# Patient Record
Sex: Female | Born: 1940 | Race: White | Hispanic: No | Smoking: Former smoker
Health system: Southern US, Community
[De-identification: ages and names within clinical notes are randomized; demographics above are authoritative.]

## PROBLEM LIST (undated history)

## (undated) DIAGNOSIS — E669 Obesity, unspecified: Secondary | ICD-10-CM

## (undated) DIAGNOSIS — I452 Bifascicular block: Secondary | ICD-10-CM

## (undated) DIAGNOSIS — R112 Nausea with vomiting, unspecified: Secondary | ICD-10-CM

## (undated) DIAGNOSIS — E039 Hypothyroidism, unspecified: Secondary | ICD-10-CM

## (undated) DIAGNOSIS — F0390 Unspecified dementia without behavioral disturbance: Secondary | ICD-10-CM

## (undated) DIAGNOSIS — Z87440 Personal history of urinary (tract) infections: Secondary | ICD-10-CM

## (undated) DIAGNOSIS — M5137 Other intervertebral disc degeneration, lumbosacral region: Secondary | ICD-10-CM

## (undated) DIAGNOSIS — F419 Anxiety disorder, unspecified: Secondary | ICD-10-CM

## (undated) DIAGNOSIS — Z812 Family history of tobacco abuse and dependence: Secondary | ICD-10-CM

## (undated) DIAGNOSIS — R002 Palpitations: Secondary | ICD-10-CM

## (undated) DIAGNOSIS — M5430 Sciatica, unspecified side: Secondary | ICD-10-CM

## (undated) DIAGNOSIS — M51379 Other intervertebral disc degeneration, lumbosacral region without mention of lumbar back pain or lower extremity pain: Secondary | ICD-10-CM

## (undated) DIAGNOSIS — I10 Essential (primary) hypertension: Secondary | ICD-10-CM

## (undated) DIAGNOSIS — E785 Hyperlipidemia, unspecified: Secondary | ICD-10-CM

## (undated) DIAGNOSIS — Z9889 Other specified postprocedural states: Secondary | ICD-10-CM

## (undated) DIAGNOSIS — M1990 Unspecified osteoarthritis, unspecified site: Secondary | ICD-10-CM

## (undated) DIAGNOSIS — R55 Syncope and collapse: Secondary | ICD-10-CM

## (undated) DIAGNOSIS — C801 Malignant (primary) neoplasm, unspecified: Secondary | ICD-10-CM

## (undated) HISTORY — DX: Unspecified osteoarthritis, unspecified site: M19.90

## (undated) HISTORY — PX: VESICOVAGINAL FISTULA CLOSURE W/ TAH: SUR271

## (undated) HISTORY — DX: Other intervertebral disc degeneration, lumbosacral region: M51.37

## (undated) HISTORY — DX: Other intervertebral disc degeneration, lumbosacral region without mention of lumbar back pain or lower extremity pain: M51.379

## (undated) HISTORY — DX: Palpitations: R00.2

## (undated) HISTORY — PX: OTHER SURGICAL HISTORY: SHX169

## (undated) HISTORY — DX: Hyperlipidemia, unspecified: E78.5

## (undated) HISTORY — DX: Essential (primary) hypertension: I10

## (undated) HISTORY — PX: KNEE SURGERY: SHX244

## (undated) HISTORY — DX: Personal history of urinary (tract) infections: Z87.440

## (undated) HISTORY — PX: CHOLECYSTECTOMY: SHX55

## (undated) HISTORY — DX: Obesity, unspecified: E66.9

## (undated) HISTORY — PX: CATARACT EXTRACTION, BILATERAL: SHX1313

## (undated) HISTORY — DX: Family history of tobacco abuse and dependence: Z81.2

## (undated) HISTORY — PX: TUBAL LIGATION: SHX77

## (undated) HISTORY — DX: Sciatica, unspecified side: M54.30

---

## 2001-09-13 ENCOUNTER — Encounter: Payer: Self-pay | Admitting: Internal Medicine

## 2001-09-13 ENCOUNTER — Ambulatory Visit (HOSPITAL_COMMUNITY): Admission: RE | Admit: 2001-09-13 | Discharge: 2001-09-13 | Payer: Self-pay | Admitting: Internal Medicine

## 2001-10-08 ENCOUNTER — Ambulatory Visit (HOSPITAL_COMMUNITY): Admission: RE | Admit: 2001-10-08 | Discharge: 2001-10-08 | Payer: Self-pay | Admitting: Family Medicine

## 2001-10-08 ENCOUNTER — Encounter: Payer: Self-pay | Admitting: Family Medicine

## 2002-10-14 ENCOUNTER — Ambulatory Visit (HOSPITAL_COMMUNITY): Admission: RE | Admit: 2002-10-14 | Discharge: 2002-10-14 | Payer: Self-pay | Admitting: Family Medicine

## 2002-10-14 ENCOUNTER — Encounter: Payer: Self-pay | Admitting: Family Medicine

## 2003-05-01 ENCOUNTER — Ambulatory Visit (HOSPITAL_COMMUNITY): Admission: RE | Admit: 2003-05-01 | Discharge: 2003-05-01 | Payer: Self-pay | Admitting: Internal Medicine

## 2003-05-01 ENCOUNTER — Encounter: Payer: Self-pay | Admitting: Internal Medicine

## 2003-06-08 ENCOUNTER — Encounter (HOSPITAL_COMMUNITY): Admission: RE | Admit: 2003-06-08 | Discharge: 2003-07-08 | Payer: Self-pay | Admitting: Neurosurgery

## 2003-06-16 ENCOUNTER — Ambulatory Visit (HOSPITAL_BASED_OUTPATIENT_CLINIC_OR_DEPARTMENT_OTHER): Admission: RE | Admit: 2003-06-16 | Discharge: 2003-06-16 | Payer: Self-pay | Admitting: Orthopedic Surgery

## 2006-12-15 DIAGNOSIS — R55 Syncope and collapse: Secondary | ICD-10-CM

## 2006-12-15 HISTORY — DX: Syncope and collapse: R55

## 2007-01-21 ENCOUNTER — Encounter: Payer: Self-pay | Admitting: Cardiology

## 2007-02-04 ENCOUNTER — Ambulatory Visit (HOSPITAL_COMMUNITY): Admission: RE | Admit: 2007-02-04 | Discharge: 2007-02-04 | Payer: Self-pay | Admitting: Family Medicine

## 2007-02-05 ENCOUNTER — Ambulatory Visit: Payer: Self-pay | Admitting: Cardiology

## 2007-02-11 ENCOUNTER — Ambulatory Visit: Payer: Self-pay | Admitting: Cardiology

## 2007-02-11 ENCOUNTER — Ambulatory Visit (HOSPITAL_COMMUNITY): Admission: RE | Admit: 2007-02-11 | Discharge: 2007-02-11 | Payer: Self-pay | Admitting: Cardiology

## 2007-03-25 ENCOUNTER — Emergency Department (HOSPITAL_COMMUNITY): Admission: EM | Admit: 2007-03-25 | Discharge: 2007-03-26 | Payer: Self-pay | Admitting: Emergency Medicine

## 2007-04-08 ENCOUNTER — Ambulatory Visit: Payer: Self-pay | Admitting: Cardiology

## 2007-04-14 ENCOUNTER — Ambulatory Visit (HOSPITAL_BASED_OUTPATIENT_CLINIC_OR_DEPARTMENT_OTHER): Admission: RE | Admit: 2007-04-14 | Discharge: 2007-04-15 | Payer: Self-pay | Admitting: *Deleted

## 2008-03-21 ENCOUNTER — Encounter: Payer: Self-pay | Admitting: Orthopedic Surgery

## 2008-03-21 ENCOUNTER — Ambulatory Visit (HOSPITAL_COMMUNITY): Admission: RE | Admit: 2008-03-21 | Discharge: 2008-03-21 | Payer: Self-pay | Admitting: Family Medicine

## 2008-04-17 ENCOUNTER — Ambulatory Visit: Payer: Self-pay | Admitting: Orthopedic Surgery

## 2008-04-17 DIAGNOSIS — M5430 Sciatica, unspecified side: Secondary | ICD-10-CM | POA: Insufficient documentation

## 2008-04-17 DIAGNOSIS — M5137 Other intervertebral disc degeneration, lumbosacral region: Secondary | ICD-10-CM | POA: Insufficient documentation

## 2008-04-18 ENCOUNTER — Ambulatory Visit: Payer: Self-pay | Admitting: Cardiology

## 2008-04-21 ENCOUNTER — Encounter (INDEPENDENT_AMBULATORY_CARE_PROVIDER_SITE_OTHER): Payer: Self-pay | Admitting: *Deleted

## 2008-04-21 LAB — CONVERTED CEMR LAB
ALT: 12 units/L
AST: 16 units/L
Albumin: 4.3 g/dL
Alkaline Phosphatase: 55 units/L
BUN: 33 mg/dL
CO2: 27 meq/L
Calcium: 9.8 mg/dL
Chloride: 106 meq/L
Cholesterol: 194 mg/dL
Creatinine, Ser: 1.28 mg/dL
Glucose, Bld: 97 mg/dL
HDL: 45 mg/dL
LDL Cholesterol: 116 mg/dL
Potassium: 4.5 meq/L
Sodium: 143 meq/L
TSH: 7.22 microintl units/mL
Total Protein: 7.1 g/dL
Triglycerides: 165 mg/dL

## 2008-07-26 ENCOUNTER — Ambulatory Visit: Payer: Self-pay | Admitting: Orthopedic Surgery

## 2008-10-30 ENCOUNTER — Encounter: Payer: Self-pay | Admitting: Orthopedic Surgery

## 2008-11-13 ENCOUNTER — Telehealth: Payer: Self-pay | Admitting: Orthopedic Surgery

## 2009-12-15 HISTORY — PX: HIP SURGERY: SHX245

## 2009-12-17 ENCOUNTER — Ambulatory Visit: Payer: Self-pay | Admitting: Orthopedic Surgery

## 2009-12-18 ENCOUNTER — Telehealth: Payer: Self-pay | Admitting: Orthopedic Surgery

## 2010-01-09 ENCOUNTER — Encounter (INDEPENDENT_AMBULATORY_CARE_PROVIDER_SITE_OTHER): Payer: Self-pay | Admitting: *Deleted

## 2010-01-16 ENCOUNTER — Encounter (INDEPENDENT_AMBULATORY_CARE_PROVIDER_SITE_OTHER): Payer: Self-pay | Admitting: *Deleted

## 2010-01-16 ENCOUNTER — Ambulatory Visit: Payer: Self-pay | Admitting: Cardiology

## 2010-01-16 DIAGNOSIS — Z8669 Personal history of other diseases of the nervous system and sense organs: Secondary | ICD-10-CM | POA: Insufficient documentation

## 2010-01-16 DIAGNOSIS — K029 Dental caries, unspecified: Secondary | ICD-10-CM | POA: Insufficient documentation

## 2010-01-16 DIAGNOSIS — E669 Obesity, unspecified: Secondary | ICD-10-CM | POA: Insufficient documentation

## 2010-01-21 ENCOUNTER — Encounter: Payer: Self-pay | Admitting: Cardiology

## 2010-01-22 ENCOUNTER — Encounter: Payer: Self-pay | Admitting: Cardiology

## 2010-01-22 ENCOUNTER — Encounter (INDEPENDENT_AMBULATORY_CARE_PROVIDER_SITE_OTHER): Payer: Self-pay | Admitting: *Deleted

## 2010-01-22 LAB — CONVERTED CEMR LAB
BUN: 27 mg/dL — ABNORMAL HIGH (ref 6–23)
Basophils Absolute: 0 10*3/uL (ref 0.0–0.1)
Basophils Relative: 1 % (ref 0–1)
CO2: 30 meq/L (ref 19–32)
Calcium: 10.4 mg/dL (ref 8.4–10.5)
Chloride: 104 meq/L (ref 96–112)
Cholesterol: 241 mg/dL — ABNORMAL HIGH (ref 0–200)
Creatinine, Ser: 1.2 mg/dL (ref 0.40–1.20)
Eosinophils Absolute: 0.1 10*3/uL (ref 0.0–0.7)
Eosinophils Relative: 2 % (ref 0–5)
Glucose, Bld: 95 mg/dL (ref 70–99)
HCT: 38 % (ref 36.0–46.0)
HDL: 38 mg/dL — ABNORMAL LOW (ref >39)
Hemoglobin: 11.9 g/dL — ABNORMAL LOW (ref 12.0–15.0)
LDL Cholesterol: 166 mg/dL — ABNORMAL HIGH (ref 0–99)
Lymphocytes Relative: 22 % (ref 12–46)
Lymphs Abs: 1.3 10*3/uL (ref 0.7–4.0)
MCHC: 31.3 g/dL (ref 30.0–36.0)
MCV: 90.9 fL (ref 78.0–100.0)
Monocytes Absolute: 0.4 10*3/uL (ref 0.1–1.0)
Monocytes Relative: 6 % (ref 3–12)
Neutro Abs: 4.3 10*3/uL (ref 1.7–7.7)
Neutrophils Relative %: 69 % (ref 43–77)
Platelets: 235 10*3/uL (ref 150–400)
Potassium: 5.5 meq/L — ABNORMAL HIGH (ref 3.5–5.3)
RBC: 4.18 M/uL (ref 3.87–5.11)
RDW: 13.5 % (ref 11.5–15.5)
Sodium: 142 meq/L (ref 135–145)
TSH: 7.606 microintl units/mL — ABNORMAL HIGH (ref 0.350–4.500)
Total CHOL/HDL Ratio: 6.3
Triglycerides: 183 mg/dL — ABNORMAL HIGH (ref <150)
VLDL: 37 mg/dL (ref 0–40)
WBC: 6.2 10*3/uL (ref 4.0–10.5)

## 2010-07-29 ENCOUNTER — Ambulatory Visit: Payer: Self-pay | Admitting: Orthopedic Surgery

## 2010-07-29 DIAGNOSIS — M169 Osteoarthritis of hip, unspecified: Secondary | ICD-10-CM | POA: Insufficient documentation

## 2010-08-01 ENCOUNTER — Encounter (INDEPENDENT_AMBULATORY_CARE_PROVIDER_SITE_OTHER): Payer: Self-pay | Admitting: *Deleted

## 2010-08-02 ENCOUNTER — Encounter (INDEPENDENT_AMBULATORY_CARE_PROVIDER_SITE_OTHER): Payer: Self-pay | Admitting: *Deleted

## 2010-08-13 ENCOUNTER — Ambulatory Visit: Payer: Self-pay | Admitting: Orthopedic Surgery

## 2010-08-13 ENCOUNTER — Inpatient Hospital Stay (HOSPITAL_COMMUNITY): Admission: RE | Admit: 2010-08-13 | Discharge: 2010-08-17 | Payer: Self-pay | Admitting: Orthopedic Surgery

## 2010-08-27 ENCOUNTER — Ambulatory Visit: Payer: Self-pay | Admitting: Orthopedic Surgery

## 2010-08-27 DIAGNOSIS — Z96649 Presence of unspecified artificial hip joint: Secondary | ICD-10-CM | POA: Insufficient documentation

## 2010-08-28 ENCOUNTER — Telehealth (INDEPENDENT_AMBULATORY_CARE_PROVIDER_SITE_OTHER): Payer: Self-pay | Admitting: *Deleted

## 2010-08-30 ENCOUNTER — Telehealth: Payer: Self-pay | Admitting: Orthopedic Surgery

## 2010-09-03 ENCOUNTER — Encounter: Payer: Self-pay | Admitting: Orthopedic Surgery

## 2010-09-04 ENCOUNTER — Encounter (HOSPITAL_COMMUNITY): Admission: RE | Admit: 2010-09-04 | Discharge: 2010-09-13 | Payer: Self-pay | Admitting: Orthopedic Surgery

## 2010-09-17 ENCOUNTER — Encounter (HOSPITAL_COMMUNITY): Admission: RE | Admit: 2010-09-17 | Discharge: 2010-10-17 | Payer: Self-pay | Admitting: Orthopedic Surgery

## 2010-09-18 ENCOUNTER — Encounter: Payer: Self-pay | Admitting: Orthopedic Surgery

## 2010-09-25 ENCOUNTER — Ambulatory Visit: Payer: Self-pay | Admitting: Orthopedic Surgery

## 2010-09-26 ENCOUNTER — Encounter: Payer: Self-pay | Admitting: Orthopedic Surgery

## 2010-10-16 ENCOUNTER — Telehealth: Payer: Self-pay | Admitting: Orthopedic Surgery

## 2010-10-18 ENCOUNTER — Encounter (HOSPITAL_COMMUNITY)
Admission: RE | Admit: 2010-10-18 | Discharge: 2010-11-17 | Payer: Self-pay | Source: Home / Self Care | Admitting: Orthopedic Surgery

## 2010-10-30 ENCOUNTER — Encounter: Payer: Self-pay | Admitting: Orthopedic Surgery

## 2010-11-27 ENCOUNTER — Ambulatory Visit: Payer: Self-pay | Admitting: Orthopedic Surgery

## 2011-01-14 NOTE — Progress Notes (Signed)
Summary: req for order out-patient physical therapy  Phone Note Other Incoming   Caller: physical therapist Summary of Call: [facility] Misty Stanley called to request order for out-patient therapy. States working w/patient through this week, then to be discharged to out-patient PT, to [facility].  Her ph# is [phone] if questions.  Initial call taken by: Cammie Sickle,  August 28, 2010 9:19 AM  Follow-up for Phone Call        ok faxed order to [facility] Follow-up by: Ether Griffins,  August 28, 2010 9:44 AM

## 2011-01-14 NOTE — Letter (Signed)
Summary: [place] [facility] Work Engineer, agricultural at [employer]  [address], [place] [address]   Phone: [phone]  Fax: [phone]     January 16, 2010 MRN: [id number]   Carol Rosario [address] [place], [place]  [address]      YOUR LAB WORK IS DUE   ___________FEBRUARY 7, 2011______________________________  Please go to [facility], located across the street from [facility] on the second floor.  Hours are Monday - Friday 7am until 7:30pm         Saturday 8am until 12noon    _X_  DO NOT EAT OR DRINK AFTER MIDNIGHT EVENING PRIOR TO LABWORK  __ YOUR LABWORK IS NOT FASTING --YOU MAY EAT PRIOR TO LABWORK

## 2011-01-14 NOTE — Miscellaneous (Signed)
Summary: PT progress note  PT progress note   Imported By: Jacklynn Ganong 09/26/2010 09:29:06  _____________________________________________________________________  External Attachment:    Type:   Image     Comment:   External Document

## 2011-01-14 NOTE — Progress Notes (Signed)
Summary: wants Oxycodone/apap prescription  Phone Note Call from Patient   Summary of Call: Brodi Nery (2041/[birth year]/06) asked if you will write new prescription for Oxycodone/APAP 5/325. Initial call taken by: Jacklynn Ganong,  October 16, 2010 1:27 PM  Follow-up for Phone Call        The percocet was decline [onlyused in the initial post op period its not a long term drug. she can have hydrocodone]  Chasity will call in  5mg  1 q 4 as needed pain # 84/ 2 refills Follow-up by: Fuller Canada MD,  October 16, 2010 2:47 PM    New/Updated Medications: NORCO 5-325 MG TABS (HYDROCODONE-ACETAMINOPHEN) 1 by mouth q 4 hrs as needed pain Prescriptions: NORCO 5-325 MG TABS (HYDROCODONE-ACETAMINOPHEN) 1 by mouth q 4 hrs as needed pain  #84 x 2   Entered by:   Ether Griffins   Authorized by:   Fuller Canada MD   Signed by:   Ether Griffins on 10/16/2010   Method used:   Historical   RxID:   [id number]

## 2011-01-14 NOTE — Assessment & Plan Note (Signed)
Summary: 4 WK RE-CK/4 WK POST OP/THA/SURG 08/13/10/SEC H/CAF   Visit Type:  Follow-up Primary Provider:  Dr. Nobie Putnam  CC:  post op hip.  History of Present Illness: this 70 year old female following up from a total hip replacement  DOS 08-13-10. Left total hip arthroplasty.  Medications: Docusate 100 mg two times a day, Ferrous Sulfate 325 mg three times a day, Oxycodone w/tylenol 5/325 1 q4 as needed, Robaxin 500 mg q6 as needed, Enoxaparin 30 mg subcu daily for 24 doses.  she has progressed to use a cane.  She is progressing well in therapy.  Her hip pain is essentially resolved.  She does have a slight leg length discrepancy about 3/8 inch RIGHT longer the LEFT.  We knew this was a problem preoperatively and would be a problem intraoperatively and postoperatively due to the nature of her length discrepancy and flexion contracture in the RIGHT knee.  I have ordered a 3/8 inch heel lift for the LEFT side  Followup 2 months     Allergies: 1)  ! Codeine   Other Orders: Post-Op Check (62130)  Patient Instructions: 1)  2 month f/u

## 2011-01-14 NOTE — Letter (Signed)
Summary: surgery order LT total hip sched 08/13/10  surgery order LT total hip sched 08/13/10   Imported By: Cammie Sickle 07/31/2010 18:50:50  _____________________________________________________________________  External Attachment:    Type:   Image     Comment:   External Document

## 2011-01-14 NOTE — Progress Notes (Signed)
Summary: Referral appointments.  Phone Note Outgoing Call   Call placed by: Waldon Reining,  December 18, 2009 9:51 AM Call placed to: Patient Action Taken: Appt scheduled Summary of Call: I called to give the patient her referral appointments. She is to see Dr. Sherwood Gambler on 12-19-09 at 11:45. She is to see Dr. Dietrich Pates on 01-16-10 at 1:30. Patient is calling Dr. Lamont Dowdy to be seen for her teeth.

## 2011-01-14 NOTE — Miscellaneous (Signed)
Summary: relafen  Clinical Lists Changes  Medications: Added new medication of NABUMETONE 500 MG TABS (NABUMETONE) one by mouth bid - Signed Rx of NABUMETONE 500 MG TABS (NABUMETONE) one by mouth bid;  #60 x 2;  Signed;  Entered by: Ether Griffins;  Authorized by: Fuller Canada MD;  Method used: Faxed to [facility] [address], [place], [place], [place]  [address], Ph: [phone], Fax: [phone]    Prescriptions: NABUMETONE 500 MG TABS (NABUMETONE) one by mouth bid  #60 x 2   Entered by:   Ether Griffins   Authorized by:   Fuller Canada MD   Signed by:   Ether Griffins on 10/30/2008   Method used:   Faxed to ...       [facility]  [address] [facility] (retail)       [address]       [place], [place]  [address]       Ph: [phone]       Fax: [phone]   RxID:   [id number]

## 2011-01-14 NOTE — Miscellaneous (Signed)
Summary: Pre-authorization information for in-patient surgery  Clinical Lists Changes   Call to insurer [employer] [facility] Medicare ph  [phone] re: in-patient surgery scheduled 08/13/10, [facility], [place] [address], dx: [phone].  Per Tretha Sciara, rec'd notification [phone]; case pending review.  If clinicals are needed, notice to follow by utilization review dept.  08/07/10 Call back to [employer] re: status of review. Spoke w/Maria -  Status approved.  Case manager has been assigned in event any further info is needed.Marland Kitchen

## 2011-01-14 NOTE — Assessment & Plan Note (Signed)
Summary: POST OP 1 THA/SURG 08/13/10/SEC HORIZ/CAF   Visit Type:  post op Primary Rhyanna Sorce:  Dr. Nobie Putnam  CC:  left hip replacement..  History of Present Illness: This is postop to a #14 for this 70 year old female status post LEFT total hip arthroplasty.  She came in today for her Staples to be removed  Staples out today.  DOS 08-13-10. Left total hip arthroplasty.  Medications: Docusate 100 mg two times a day, Ferrous Sulfate 325 mg three times a day, Oxycodone w/tylenol 5/325 1 q4 as needed, Robaxin 500 mg q6 as needed, Enoxaparin 30 mg subcu daily for 24 doses.  Her incision has staple erythema there is no sign of true infection.  There is minimal swelling over the lateral thigh.  She is ambulatory with assistive device but is improving very rapidly.  She will continue her physical therapy and return in 4 weeks for reexamination.  We did give her some prophylactic Keflex and refilled her oxycodone one more time  Allergies: 1)  ! 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 1 q 4 as needed pain 2)  Keflex 500 Mg Caps (Cephalexin) .Marland Kitchen.. 1 by mouth two times a day  Patient Instructions: 1)  Please schedule a follow-up appointment in 4 weeks. 2)  examination only  Prescriptions: KEFLEX 500 MG CAPS (CEPHALEXIN) 1 by mouth two times a day  #20 x 1   Entered and Authorized by:   Fuller Canada MD   Signed by:   Fuller Canada MD on 08/27/2010   Method used:   Print then Give to Patient   RxID:   [id number] OXYCODONE-ACETAMINOPHEN 5-325 MG TABS (OXYCODONE-ACETAMINOPHEN) 1 q 4 as needed pain  #90 x 0   Entered and Authorized by:   Fuller Canada MD   Signed by:   Fuller Canada  MD on 08/27/2010   Method used:   Print then Give to Patient   RxID:   [id number]

## 2011-01-14 NOTE — Miscellaneous (Signed)
Summary: PT progress note  PT progress note   Imported By: Jacklynn Ganong 10/30/2010 08:39:45  _____________________________________________________________________  External Attachment:    Type:   Image     Comment:   External Document

## 2011-01-14 NOTE — Letter (Signed)
Summary: Generic Letter  Architectural technologist at [facility]  [address] [place] [address]   Phone: [phone]  Fax: [phone]        January 22, 2010 MRN: [id number]    Carol Rosario [address] [place], [place]  [address]    Dear [name],        As previously discussed on the telephone with you today, enclosed   is information on a low fat, low potassium diet.  You may  use benecol   margarine and red rice yeast over the counter to help control your   cholesterol.    Sincerely, Teressa Lower RN  This letter has been electronically signed by your physician.

## 2011-01-14 NOTE — Miscellaneous (Signed)
Summary: PT clinical evaluation  PT clinical evaluation   Imported By: Jacklynn Ganong 09/18/2010 09:02:47  _____________________________________________________________________  External Attachment:    Type:   Image     Comment:   External Document

## 2011-01-14 NOTE — Letter (Signed)
Summary: [place] [facility] Work Engineer, agricultural at [employer]  [address] [place] [address]   Phone: [phone]  Fax: [phone]     January 22, 2010 MRN: [id number]   Carol Rosario [address] [place], [place]  [address]      YOUR LAB WORK IS DUE   ______________MARCH 8, 2011___________________________  Please go to [facility], located across the street from [facility] on the second floor.  Hours are Monday - Friday 7am until 7:30pm         Saturday 8am until 12noon    _X_  DO NOT EAT OR DRINK AFTER MIDNIGHT EVENING PRIOR TO LABWORK  __ YOUR LABWORK IS NOT FASTING --YOU MAY EAT PRIOR TO LABWORK

## 2011-01-14 NOTE — Miscellaneous (Signed)
Summary: labs cmp,lipids,tsh 04/21/2008  Clinical Lists Changes  Observations: Added new observation of CALCIUM: 9.8 mg/dL (16/09/9603 54:09) Added new observation of ALBUMIN: 4.3 g/dL (81/19/1478 29:56) Added new observation of PROTEIN, TOT: 7.1 g/dL (21/30/8657 84:69) Added new observation of SGPT (ALT): 12 units/L (04/21/2008 11:19) Added new observation of SGOT (AST): 16 units/L (04/21/2008 11:19) Added new observation of ALK PHOS: 55 units/L (04/21/2008 11:19) Added new observation of CREATININE: 1.28 mg/dL (62/95/2841 32:44) Added new observation of BUN: 33 mg/dL (12/17/7251 66:44) Added new observation of BG RANDOM: 97 mg/dL (03/47/4259 56:38) Added new observation of CO2 PLSM/SER: 27 meq/L (04/21/2008 11:19) Added new observation of CL SERUM: 106 meq/L (04/21/2008 11:19) Added new observation of K SERUM: 4.5 meq/L (04/21/2008 11:19) Added new observation of NA: 143 meq/L (04/21/2008 11:19) Added new observation of LDL: 116 mg/dL (75/64/3329 51:88) Added new observation of HDL: 45 mg/dL (41/66/0630 16:01) Added new observation of TRIGLYC TOT: 165 mg/dL (09/32/3557 32:20) Added new observation of CHOLESTEROL: 194 mg/dL (25/42/7062 37:62) Added new observation of TSH: 7.220 microintl units/mL (04/21/2008 11:19)

## 2011-01-14 NOTE — Miscellaneous (Signed)
Summary: Orders Update  Clinical Lists Changes  Orders: Added new Test order of T-Lipid Profile [phone]) - Signed Added new Test order of T-Comprehensive Metabolic Panel [id number]) - Signed Observations: Added new observation of PRIMARY MD: Dr. Nobie Putnam (01/22/2010 13:40)         Allergies: 1)  ! Codeine

## 2011-01-14 NOTE — Letter (Signed)
Summary: Historic Patient File  Historic Patient File   Imported By: Elvera Maria 04/21/2008 10:15:57  _____________________________________________________________________  External Attachment:    Type:   Image     Comment:   history form

## 2011-01-14 NOTE — Assessment & Plan Note (Signed)
Summary: EVAL LT hip/OA/REF HURST/CRESENZO/XR APH/HUMANA/CAF   Vital Signs:  Patient Profile:   70 Years Old Female Weight:      185 pounds                 Chief Complaint:  left hip arthritis.  History of Present Illness: I saw Carol Rosario in the office today for an initial visit.  She is a 70 years old woman with the complaint of:  left hip pain.   Referral from Dr. Nobie Putnam.  Her pain is in the left gluteal region; she denies thigh and groin pain   Xrays [name] 03-21-08, osteoarthritis changes in bilateral hip joints.  Patient states that she has had hip pain for 5-6 months. She states that it hurts is she walks alot.    Updated Prior Medication List: LEVOTHROID 25 MCG  TABS (LEVOTHYROXINE SODIUM)  LIPITOR 10 MG  TABS (ATORVASTATIN CALCIUM)  ALENDRONATE SODIUM 70 MG  TABS (ALENDRONATE SODIUM)  LISINOPRIL-HYDROCHLOROTHIAZIDE 20-12.5 MG  TABS (LISINOPRIL-HYDROCHLOROTHIAZIDE)   Current Allergies: ! CODEINE  Past Medical History:    HTN    Thyroid    irregular heart beat  Past Surgical History:    bilateral cataracts    gallbladder    hysterectomy    bladder tact   Family History:    FH of Cancer:     Family History of Diabetes    Family History Coronary Heart Disease female < 4    Family History of Arthritis  Social History:    Patient is married.     housewife   Risk Factors:  Tobacco use:  never Caffeine use:  0 drinks per day Alcohol use:  no  Family History Risk Factors:    Family History of MI in males < 52 years old:  yes   Review of Systems  General      Denies weight loss, weight gain, fever, chills, and fatigue.  Cardiac      Complains of chest pain.      Denies angina, heart attack, heart failure, poor circulation, blood clots, and phlebitis.  Resp      Complains of pneumonia.      Denies short of breath, difficulty breathing, COPD, and cough.  GI      Denies nausea, vomiting, diarrhea, constipation, difficulty  swallowing, ulcers, GERD, and reflux.  GU      Denies kidney failure, kidney transplant, kidney stones, burning, poor stream, testicular cancer, blood in urine, and .  Neuro      Complains of dizziness.      Denies headache, migraines, numbness, weakness, tremor, and unsteady walking.  MS      Denies joint pain, rheumatoid arthritis, joint swelling, gout, bone cancer, osteoporosis, and .  Endo      Complains of thyroid disease.      Denies goiter and diabetes.  Psych      Denies depression, mood swings, anxiety, panic attack, bipolar, and schizophrenia.  Derm      Denies eczema, cancer, and itching.  EENT      Complains of cataracts.      Denies poor vision, glaucoma, poor hearing, vertigo, ears ringing, sinusitis, hoarseness, toothaches, and bleeding gums.  Immunology      Denies seasonal allergies, sinus problems, and allergic to bee stings.  Lymphatic      Denies lymph node cancer and lymph edema.   Physical Exam  Constitutional: vital signs see recorded values. General: normal development, nutrition, and grooming. No deformity. Body  Habitus is medium. CDV: Observation and palpation was normal  Lymph: palpation of the lymph nodes were normal Skin: inspection and palpation of the skin revealed no abnormalities  Neuro: coordination: normal              DTR's normal              Sensation was normal  Psyche: Mood was normal.  Affect: normal  MSK: Gait: normal   back examination reveals lumbar tenderness straight leg raises are non-reactive.  Hip examination reveals no pain with internal rotation of log rolled over leg lengths are equal muscle tone is normal strength is good. Abduction and adduction arc is 50 flexion 120 bilaterally. Extension +5.    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 Impression degenerative  disc disease.   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 The report and the films have been reviewed.  Orders: New Patient Level III (14782)   Medications Added to Medication List This Visit: 1)  Levothroid 25 Mcg Tabs (Levothyroxine sodium) 2)  Lipitor 10 Mg Tabs (Atorvastatin calcium) 3)  Alendronate Sodium 70 Mg Tabs (Alendronate sodium) 4)  Lisinopril-hydrochlorothiazide 20-12.5 Mg Tabs (Lisinopril-hydrochlorothiazide)   Patient Instructions: 1)  Manage with non surgical treatment  2)  Please schedule a follow-up appointment in 3 months.    ]

## 2011-01-14 NOTE — Assessment & Plan Note (Signed)
Summary: BI HIP PAIN NEEDS XR DISCUSS SURGERY/SEC HOR/BSF   Visit Type:  pre op  Primary Provider:  Dr. Nobie Putnam  CC:  bilateral hip pain.Marland Kitchen  History of Present Illness: This is a 70 year old female who presents to [place] in followup for preoperative evaluation regarding LEFT total hip replacement surgery.  I've seen her in the past she complains of bilateral hip pain LEFT greater than RIGHT which is distal aching and has become very severe and constant.  She's had for several years now with no history of trauma just progressive onset of pain.  Modifying factors no real relief has been gained from medication or conservative treatment.  Associated symptoms and signs include decreased ability to perform activities of daily living.  She has been seen by the cardiologist was cleared her for surgery.  She's had appropriate dental work.  She understands the risk and benefits of the procedure.  I have given her various pictures and handouts regarding total hip replacement surgery and have discussed the risk benefit ratio of the surgery including no surgery.  She agrees to proceed.     Allergies: 1)  ! 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  housewife Tobacco Use - Yes.  Regular Exercise - no  Risk Factors: Caffeine Use: 0 (04/17/2008) Exercise: no (06/22/2009)  Risk Factors: Smoking Status: current (06/22/2009)  Review of Systems Cardiovascular:  she has had occasional shortness of breath which has been worked up by the cardiologist including chest x-ray.. Neurologic:  she has had some symptoms related to her lumbar spine but they appear to be stable at this time. Musculoskeletal:  See HPI.  The review of systems is negative for Constitutional, Respiratory, Gastrointestinal, Genitourinary, Endocrine, Psychiatric, Skin, HEENT, Immunology, and Hemoatologic.  Physical Exam  Additional Exam:  GEN: well developed, well nourished, normal grooming and hygiene, no deformity and endomorphic habitus.   CDV: pulses are normal, mild edema, no erythema. no tenderness  Lymph: normal lymph nodes   Skin: no rashes, skin lesions or open sores   NEURO: normal coordination, reflexes, sensation.   Psyche: awake, alert and oriented. Mood normal   Gait: no major gait abnormalities  Her lower extremities were equal in length  flexion of both hips cause no discomfort she does not have any tenderness over the greater trochanter of either hip.  Internal and external rotation did not cause any discomfort in the groin of either hip. The only time she had pain is with extreme abduction.   The upper extremities have normal appearance, ROM, strength and stability.    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  The acetabular shell.  However the proximal femur appears normal.  There is definite joint space narrowing and Cox  arthrosis.  Recommend LEFT total hip arthroplasty with the Depuy system.  Orders: Est. Patient Level IV (56213) Pelvis x-ray, 1/2 views (08657) Hip x-ray unilateral complete, minimum 2 views (84696)  Patient Instructions: 1)  Informed consent process: I have discussed the procedure with the patient. I have answered their questions. The risks of bleeding, infection, nerve and vascualr injury have been discussed. The diagnosis and reason for surgery have been explained. The patient demonstrates understanding of this discussion. Specific to this procedure risks include:  2)  dislocation-can lead to need to have more sugery 3)  infection-can lead to need to have further surgery 4)  leg length difference-you mad need a shoe lift.

## 2011-01-14 NOTE — Assessment & Plan Note (Signed)
Summary: xr bilateral hips/medicare/bsf   Visit Type:  Follow-up  CC:  left hip pain [right hip pain].  History of Present Illness: I saw Carol Rosario in the office today for a 3 MONTH  followup visit.  She is a 70 years old woman with the complaint of:  LEFT HIP pain for a long time.  DX:  Arthritis.  Treatment: Meds.  ROS: Has pain in lower back also.  MEDS: Ultracet.  Complaints:  She says that her hip hurts all the time , out of medication.  No numbness in legs or feet.  Today, scheduled for:  Recheck with xrays.  MRI 2004 report for review from [facility].  physical examination  The patient is well developed and nourished, with normal grooming and hygiene. The body habitus is  normal   Ambulation is normal.  Inspection reveals no tenderness around the hip joint.  LEFT hip flexion is limited to 100 RIGHT hip flexion 115.  There is limited internal rotation pain with both hips.  There is limited abduction on the LEFT and limited abduction on the RIGHT.  Voltage remains stable muscle tone and strength in the lower extremities is normal.  Skin is intact sensation is normal reflexes are normal pulses are normal     Allergies: 1)  ! 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  housewife Full Time Tobacco Use - Yes.  Regular Exercise - no  Risk Factors: Caffeine Use: 0 (04/17/2008) Exercise: no (06/22/2009)  Risk Factors: Smoking Status: current (06/22/2009)   Impression & Recommendations:  Problem # 1:  PRIMARY LOC OSTEOARTHROSIS PELVIC REGION&THIGH (ICD-715.15) bilateral hip films including an AP of the pelvis show there is subluxation of both femoral heads with degenerative arthritis severe bilateral varus valgus position of the femoral neck in relation to the shaft  Impression severe osteoarthritis of both hips  At this patient patient is a good candidate for hip replacement.  However, she is to have a cardiology consult the primary medical consult as well as a dental evaluation because of poor dentition.   Orders: Cardiology Referral (Cardiology) Primary Care Referral (Primary) Est. Patient Level III ([address]) Pelvis x-ray, 1/2 views (60454) Hip x-ray unilateral complete, minimum 2 views ([address])  Patient Instructions: 1)  You need hip replacements due to arthritis of your hips. 2)  Get medical clearance from [facility] and from [facility]. 3)  Go to a dentist Dr. Marlena Clipper we will give you the number to get teeth checked to see if any teeth need to be pulled before total hip replacement. 4)  Call us after you see all these dr's and we will make you an appt to come in here to schedule total hip surgery. Prescriptions: ULTRACET 37.5-325 MG  TABS (TRAMADOL-ACETAMINOPHEN) one by mouth q 4 hrs prn  #60 x 2   Entered and Authorized by:   Fuller Canada MD  Signed by:   Fuller Canada MD on 12/17/2009   Method used:   Print then Give to Patient   RxID:   [id number]

## 2011-01-14 NOTE — Progress Notes (Signed)
Summary: Questions re: RXs  Phone Note Call from Patient   Caller: Patient Summary of Call: Pt called re: 2 Rx's requested on 10/30/08. States she was unable to afford the Relafan or the Ultracet, due to no insurance; therefore, she is asking about possible equivalent Rx's to these that may be on the "$4.00 list" at [facility]. Pt's daugh in law also got on line to relate that our office will be faxed the $4.00 list, and that there may be an Ibuprofen equiv for the Relafan. List will be in your box. Initial call taken by: Cammie Sickle,  November 13, 2008 10:27 AM  Follow-up for Phone Call        advil 3 tabs 4 x a day   tylenol 1000mg  3 x a day Follow-up by: Fuller Canada MD,  November 13, 2008 10:38 AM  Additional Follow-up for Phone Call Additional follow up Details #1::        tried calling pt back 12:10pm - 12:19pm - Line busy.  tried calling 1:53pm - someone answered then hung up. Additional Follow-up by: Cammie Sickle,  November 13, 2008 12:20 PM    Additional Follow-up for Phone Call Additional follow up Details #2::    advised patient. Follow-up by: Cammie Sickle,  November 13, 2008 2:45 PM

## 2011-01-14 NOTE — Assessment & Plan Note (Signed)
Summary: 3 M RECHECK LFT HIP/MEDICATION RESP/HUMANA/CAF    History of Present Illness: I saw Carol Rosario in the office today for a followup visit.  She is a 70 years old woman with the complaint of:  3 month recheck on left hip.  Her hip is somewhat better AFTER BEING ON ULTRACET   If she stands alot she has pain.  She continues to have pain in her left buttock lateral thigh and lumbar spine.  HISTORY:  Referral from Dr. Nobie Putnam.  Her pain is in the left gluteal region; she denies thigh and groin pain   Xrays Carol Rosario 03-21-08, osteoarthritis changes in bilateral hip joints.  Patient states that she has had hip pain for 5-6 months. She states that it hurts is she walks alot.    Current Allergies: ! CODEINE     Review of Systems  Neuro      back pain   Physical Exam  Appearance was normal  She was awake and alert she was oriented x3  Showed good normal pulses and perfusion to both lower extremities. Her skin was warm dry and intact in both lower extremities.  Her lower extremities were equal in length flexion of both hips cause no discomfort she does she does not have any tenderness over the greater trochanter of either hip.  Internal and external rotation did not cause any discomfort in the groin of either hip. The only time she had pain is with extreme abduction.    Impression & Recommendations:  Problem # 1:  PRIMARY LOC OSTEOARTHROSIS PELVIC REGION&THIGH (ICD-715.15) Assessment: Unchanged She is stable at this point in terms of her hip arthritis which is bilateral elbow pain is primarily on the left side. Orders: Est. Patient Level III ([address])   Problem # 2:  DEGENERATIVE DISC DISEASE, LUMBAR SPINE (ICD-722.52) Assessment: Unchanged  Orders: Est. Patient Level III ([address])    Patient Instructions: 1)  Take medication/ultracet 2)  6 month followup for xrays of bilateral hips   Prescriptions: ULTRACET 37.5-325 MG  TABS (TRAMADOL-ACETAMINOPHEN)  one by mouth q 4 hrs prn  #60 x 2   Entered and Authorized by:   Fuller Canada MD   Signed by:   Fuller Canada MD on 07/26/2008   Method used:   Print then Give to Patient   RxID:   [id number]  ]

## 2011-01-14 NOTE — Miscellaneous (Signed)
Summary: [facility]ed to [facility] for [facility]  Clinical Lists Changes

## 2011-01-14 NOTE — Assessment & Plan Note (Signed)
Summary: ***per Dr.Harrisons office needs surgical clearance for hip r...  Medications Added ALENDRONATE SODIUM 70 MG  TABS (ALENDRONATE SODIUM) take 1 tab weekly LISINOPRIL-HYDROCHLOROTHIAZIDE 20-12.5 MG  TABS (LISINOPRIL-HYDROCHLOROTHIAZIDE) take 1 tab daily TRAMADOL-ACETAMINOPHEN 37.5-325 MG TABS (TRAMADOL-ACETAMINOPHEN) take q4hrs as needed for pain HYDROCODONE-ACETAMINOPHEN 5-500 MG TABS (HYDROCODONE-ACETAMINOPHEN) take 1 tab asneeded for pain at at bedtime      Allergies Added:   Visit Type:  Follow-up Primary Provider:  Dr. Nobie Putnam   History of Present Illness: It was my pleasure evaluating Carol Rosario at the request of Dr. Romeo Apple prior to planned total hip arthroplasty.  Although I previously followed this nice woman until she was lost to followup last year, she really has no significant cardiac disease of which we are aware other than hypertension.  She suffered a syncopal spell with injury 3 years ago, but has noted no recurrence.  She notes intermittent flushing with lightheadedness, but no loss of consciousness.  These symptoms can be prolonged, lasting up to an hour.  She does not necessarily feel as if she needs to sit down or lie down when symptoms occur.  She has had no chest pain.  She has no known lung disease or dyspnea; however, she reports developing hypoxemia with recent surgeries including following ORIF of her left radius in 2008  EKG  Procedure date:  01/16/2010  Findings:      Normal sinus rhythm with frequent PVCs Right bundle branch block Left anterior fascicular block Possible left ventricular hypertrophy Abnormal R wave progression No change when compared to a previous tracing of 02/05/2007   Current Medications (verified): 1)  Alendronate Sodium 70 Mg  Tabs (Alendronate Sodium) .... Take 1 Tab Weekly 2)  Lisinopril-Hydrochlorothiazide 20-12.5 Mg  Tabs (Lisinopril-Hydrochlorothiazide) .... Take 1 Tab Daily 3)  Ultracet 37.5-325 Mg  Tabs  (Tramadol-Acetaminophen) .... One By Mouth Q 4 Hrs Prn 4)  Tramadol-Acetaminophen 37.5-325 Mg Tabs (Tramadol-Acetaminophen) .... Take Q4hrs As Needed For Pain 5)  Hydrocodone-Acetaminophen 5-500 Mg Tabs (Hydrocodone-Acetaminophen) .... Take 1 Tab Asneeded For Pain At At Bedtime  Allergies (verified): 1)  ! Codeine  Past History:  PMH, FH, and Social History reviewed and updated.  Review of Systems  The patient denies anorexia, fever, weight loss, weight gain, vision loss, decreased hearing, hoarseness, chest pain, syncope, dyspnea on exertion, peripheral edema, prolonged cough, headaches, hemoptysis, abdominal pain, melena, and hematochezia.    Vital Signs:  Patient profile:   70 year old female Height:      65 inches Weight:      166 pounds BMI:     27.72 Pulse rate:   76 / minute BP sitting:   125 / 79  (right arm)  Vitals Entered By: Dreama Saa, CNA (January 16, 2010 1:27 PM)  Physical Exam  General:   General-Well developed; no acute distress:   Neck-No JVD; no carotid bruits: Lungs-No tachypnea, no rales; no rhonchi; no wheezes: Cardiovascular-normal PMI; normal S1 and S2: Abdomen-BS normal; soft and non-tender without masses or organomegaly:  Musculoskeletal-No deformities, no cyanosis or clubbing: Neurologic-Normal cranial nerves; symmetric strength and tone:  Skin-Warm, no significant lesions: Extremities-Nl distal pulses; no edema:     Impression & Recommendations:  Problem # 1:  HYPERTENSION (ICD-401.9) Blood pressure control is good on modest medications, which will be continued.  Problem # 2:  HYPERLIPIDEMIA (ICD-272.4) No recent assessment of adequacy of treatment for hyperlipidemia; a lipid profile will be obtained as well as a chemistry profile.  Problem # 3:  SYNCOPE, HX  OF (ICD-V12.49) No recurrence since 2008.  Her recent symptoms do not sound like presyncope.  No further investigation is warranted at the present time.  Carol Rosario has an  age-appropriate risk for the proposed orthopaedic surgical procedure.  No further testing or modification of her medical regime would decrease the likelihood of perioperative problems.  Her history of postoperative hypoxemia is of some concern.  I will obtain a preoperative chest x-ray now.  If normal, increased vigilance following surgery is all that will be required.  Problem # 4:  DENTAL CARIES (ICD-521.00) Patient has been advised to proceed with extraction of all remaining teeth prior to orthopaedic surgery.  Unfortunately, she cannot afford to proceed with that procedure and was not accepted as a [facility].  We will arrange for consultation with one of the financial counselors at [facility].  If she is approved, she can then be referred to the dentist at [facility].  I will plan to see this pleasant woman again in one year and will be available to assist as needed in the perioperative period.  Other Orders: Future Orders: T-CBC w/Diff ([id number]) ... 01/21/2010 T-Basic Metabolic Panel [id number]) ... 01/21/2010 T-TSH [phone]) ... 01/21/2010 T-Lipid Profile [id number]) ... 01/21/2010   Patient Instructions: 1)  Your physician recommends that you schedule a follow-up appointment in: 1 YEAR 2)  Your physician recommends that you return for lab work in: [employer] WEEK 3)  You have been referred to DENTIST AT [facility] AFTER APPLY FOR [place] DISCOUNT

## 2011-01-14 NOTE — Miscellaneous (Signed)
Summary: [facility] plan of care  [facility] plan of care   Imported By: Jacklynn Ganong 09/03/2010 12:18:15  _____________________________________________________________________  External Attachment:    Type:   Image     Comment:   External Document

## 2011-01-14 NOTE — Progress Notes (Signed)
Summary: call from [facility] nurse,d/c today  Phone Note From Other Clinic   Caller: nurse, [facility] Summary of Call: Julieanne Cotton [facility] lft voice mail msg about seeing patient today for dressing change and notes wound is very red at bottom of it, on a small part of incision. Patient on anti-biotics.  States patient's next appt is 09/25/10 and nurse instructed patient to call to fol/up w/ our office if any problems, and that she was contacting Dr Romeo Apple about today's visit.  Discharging from home care today. [facility] ph [phone].  * I  have called /patient to offer appointment, and her ph# as well as her alternate contact ph# (daughter) are disconnected.  Called back to [facility]-no alt ph #'s on file there either.  Initial call taken by: Cammie Sickle,  August 30, 2010 12:32 PM

## 2011-01-16 NOTE — Assessment & Plan Note (Signed)
Summary: 2 M RE-CK LT HIP/THA SURGERY 08/13/10/SEC  HORIZ/CAF   Visit Type:  Follow-up Primary [facility]:  Dr. Nobie Putnam  CC:  post op left THA.  History of Present Illness: this 70 year old female following up from a total hip replacement  DOS 08-13-10. Left total hip arthroplasty.  Medications: Docusate 100 mg two times a day, Ferrous Sulfate 325 mg three times a day, Oxycodone w/tylenol 5/325 1 q4 as needed, Robaxin 500 mg q6 as needed  Today is 2 month recheck after heel lift in shoe, helps.  ROS grandson was killed in car wreck yesterday.  Still some pain every now and then in the left hip, doing fine.  3/8 in left left shoe working well   Exam: no pain with hip flexion    Allergies: 1)  ! Codeine   Impression & Recommendations:  Problem # 1:  TOTAL HIP FOLLOW-UP (ICD-V43.64) Assessment Improved  Orders: Est. Patient Level II ([address])   Orders Added: 1)  Est. Patient Level II [[address]]

## 2011-01-28 ENCOUNTER — Ambulatory Visit (INDEPENDENT_AMBULATORY_CARE_PROVIDER_SITE_OTHER): Payer: MEDICARE | Admitting: Adult Health

## 2011-01-28 ENCOUNTER — Encounter: Payer: Self-pay | Admitting: Adult Health

## 2011-01-28 DIAGNOSIS — E785 Hyperlipidemia, unspecified: Secondary | ICD-10-CM

## 2011-01-28 DIAGNOSIS — I10 Essential (primary) hypertension: Secondary | ICD-10-CM

## 2011-01-29 ENCOUNTER — Encounter: Payer: Self-pay | Admitting: Adult Health

## 2011-02-05 NOTE — Assessment & Plan Note (Signed)
Summary: ROV REFILL NEEDED  Medications Added LISINOPRIL-HYDROCHLOROTHIAZIDE 20-12.5 MG  TABS (LISINOPRIL-HYDROCHLOROTHIAZIDE) take 1 tab daily ULTRACET 37.5-325 MG TABS (TRAMADOL-ACETAMINOPHEN) 1 tablet every 6 hrs.as needed for pain      Allergies Added:   Visit Type:  Follow-up Primary Provider:  Dr.fusco   History of Present Illness: Carol Rosario is a 70 y/o CF we are following for hypertension and hypercholesterolemia.  She has had a recent L hip repair and is doing well with that.  She is without complaint of chest pain or shortness of breath.  She is up walking a lot and has her energy slowly improving. She has no cardiac issues today.  Review of recent labs demonstrate mildlu elevated potassium 5.5.  Cholesterol is elevated at 241 , HDL 38, TG 183, LDL 37.   Current Medications (verified): 1)  Lisinopril-Hydrochlorothiazide 20-12.5 Mg  Tabs (Lisinopril-Hydrochlorothiazide) .... Take 1 Tab Daily 2)  Ultracet 37.5-325 Mg  Tabs (Tramadol-Acetaminophen) .... One By Mouth Q 4 Hrs Prn 3)  Hydrocodone-Acetaminophen 5-500 Mg Tabs (Hydrocodone-Acetaminophen) .... Take 1 Tab Asneeded For Pain At At Bedtime 4)  Ultracet 37.5-325 Mg Tabs (Tramadol-Acetaminophen) .Marland Kitchen.. 1 Tablet Every 6 Hrs.as Needed For Pain  Allergies (verified): 1)  ! Codeine  Comments:  Nurse/Medical Assistant: patient brought meds she uses [facility] in [place] she has norco 5/325 also ultracet 37.5-325 she takes norco in the day and ultracet at night   Past History:  Past medical, surgical, family and social histories (including risk factors) reviewed, and no changes noted (except as noted below).  Past Medical History: Reviewed history from 01/16/2010 and no changes required. 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 Bilateral cataract extraction Cholecystectomy Bilateral tubal ligation Hysterectomy Bladder resuspension  Family History: Reviewed history from 01/16/2010 and no changes required. Positive for diabetes, coronary artery disease, arthritis, hypertension and neoplastic disease  Social History: Reviewed history from 01/16/2010 and no changes required. Patient is married.  housewife Tobacco Use - Yes.  Regular Exercise - no  Review of Systems       All other systems have been reviewed and are negative unless stated above.   Vital Signs:  Patient profile:   70 year old female Weight:      160 pounds BMI:     26.72 O2 Sat:      94 % on Room air Pulse rate:   79 / minute BP sitting:   131 / 80  (left arm)  Vitals Entered By: Dreama Saa, CNA (January 28, 2011 2:31 PM)  O2 Flow:  Room air  Physical Exam  General:  Well developed, well nourished, in no acute distress. Mouth:  poor dentition.   Lungs:  Clear bilaterally to auscultation and percussion. Heart:  Non-displaced PMI, chest non-tender; regular rate and rhythm, S1, S2 without murmurs, rubs or gallops. Carotid upstroke normal, no bruit. Normal abdominal aortic size, no bruits. Femorals normal pulses, no bruits. Pedals normal pulses. No edema, no varicosities. Abdomen:  Bowel sounds positive; abdomen soft and non-tender without masses, organomegaly, or hernias noted. No hepatosplenomegaly. Msk:  Some instablitly with walking. Pulses:  pulses normal in all 4 extremities Extremities:  No clubbing or cyanosis. Neurologic:  Alert and oriented x 3. Psych:  Normal affect.  EKG  Procedure date:  01/28/2011  Findings:      LAFB, Normal sinus rhythm with rate of: 78  Right bundle  branch block.    Impression & Recommendations:  Problem # 1:  HYPERTENSION (ICD-401.9) She is well controlled at present.  Will refil BP medications.  Review of labs shows normal renal fx. Her updated medication list for this problem includes:    Lisinopril-hydrochlorothiazide 20-12.5 Mg Tabs (Lisinopril-hydrochlorothiazide) .Marland Kitchen... Take 1 tab daily  Problem # 2:  HIP, ARTHRITIS, DEGEN./OSTEO (ICD-715.95)  Patient Instructions: 1)  Your physician recommends that you schedule a follow-up appointment in: 6 months 2)  Your physician has recommended you make the following change in your medication: start taking Ultracet 37.5/325mg  every 6 hrs. as needed for pain Prescriptions: ULTRACET 37.5-325 MG TABS (TRAMADOL-ACETAMINOPHEN) 1 tablet every 6 hrs.as needed for pain  #60 x 0   Entered by:   Larita Fife Via LPN   Authorized by:   Joni Reining, NP   Signed by:   Larita Fife Via LPN on 16/09/9603   Method used:   Electronically to        [facility]  [address] (retail)       [address]       [place], [place]  [address]       Ph: [phone]       Fax: [phone]   RxID:   [id number] LISINOPRIL-HYDROCHLOROTHIAZIDE 20-12.5 MG  TABS (LISINOPRIL-HYDROCHLOROTHIAZIDE) take 1 tab daily  #30 x 7   Entered by:   Larita Fife Via LPN   Authorized by:   Joni Reining, NP   Signed by:   Larita Fife Via LPN on 10/11/2535   Method used:   Electronically to        [facility]  [address] (retail)       [address]       [place], [place]  [address]       Ph: [phone]       Fax: [phone]   RxID:   [id number]

## 2011-02-12 ENCOUNTER — Encounter: Payer: Self-pay | Admitting: Orthopedic Surgery

## 2011-02-27 LAB — CBC
HCT: 22.5 % — ABNORMAL LOW (ref 36.0–46.0)
HCT: 25.6 % — ABNORMAL LOW (ref 36.0–46.0)
Hemoglobin: 7.8 g/dL — ABNORMAL LOW (ref 12.0–15.0)
Hemoglobin: 8.6 g/dL — ABNORMAL LOW (ref 12.0–15.0)
MCH: 30 pg (ref 26.0–34.0)
MCH: 30.9 pg (ref 26.0–34.0)
MCHC: 33.5 g/dL (ref 30.0–36.0)
MCHC: 34.6 g/dL (ref 30.0–36.0)
MCV: 89.5 fL (ref 78.0–100.0)
MCV: 89.5 fL (ref 78.0–100.0)
Platelets: 118 10*3/uL — ABNORMAL LOW (ref 150–400)
Platelets: 136 10*3/uL — ABNORMAL LOW (ref 150–400)
RBC: 2.52 MIL/uL — ABNORMAL LOW (ref 3.87–5.11)
RBC: 2.87 MIL/uL — ABNORMAL LOW (ref 3.87–5.11)
RDW: 13.6 % (ref 11.5–15.5)
RDW: 14.6 % (ref 11.5–15.5)
WBC: 6.8 10*3/uL (ref 4.0–10.5)
WBC: 7.7 10*3/uL (ref 4.0–10.5)

## 2011-02-27 LAB — BASIC METABOLIC PANEL
BUN: 13 mg/dL (ref 6–23)
BUN: 14 mg/dL (ref 6–23)
CO2: 30 mEq/L (ref 19–32)
CO2: 30 mEq/L (ref 19–32)
Calcium: 8.8 mg/dL (ref 8.4–10.5)
Calcium: 8.9 mg/dL (ref 8.4–10.5)
Chloride: 104 mEq/L (ref 96–112)
Chloride: 104 mEq/L (ref 96–112)
Creatinine, Ser: 0.98 mg/dL (ref 0.4–1.2)
Creatinine, Ser: 1.06 mg/dL (ref 0.4–1.2)
GFR calc Af Amer: >60 mL/min (ref >60)
GFR calc Af Amer: >60 mL/min (ref >60)
GFR calc non Af Amer: 52 mL/min — ABNORMAL LOW (ref >60)
GFR calc non Af Amer: 56 mL/min — ABNORMAL LOW (ref >60)
Glucose, Bld: 100 mg/dL — ABNORMAL HIGH (ref 70–99)
Glucose, Bld: 116 mg/dL — ABNORMAL HIGH (ref 70–99)
Potassium: 3.9 mEq/L (ref 3.5–5.1)
Potassium: 3.9 mEq/L (ref 3.5–5.1)
Sodium: 137 mEq/L (ref 135–145)
Sodium: 139 mEq/L (ref 135–145)

## 2011-02-27 LAB — DIFFERENTIAL
Basophils Absolute: 0 10*3/uL (ref 0.0–0.1)
Basophils Absolute: 0 10*3/uL (ref 0.0–0.1)
Basophils Relative: 0 % (ref 0–1)
Basophils Relative: 0 % (ref 0–1)
Eosinophils Absolute: 0 10*3/uL (ref 0.0–0.7)
Eosinophils Absolute: 0 10*3/uL (ref 0.0–0.7)
Eosinophils Relative: 0 % (ref 0–5)
Eosinophils Relative: 1 % (ref 0–5)
Lymphocytes Relative: 17 % (ref 12–46)
Lymphocytes Relative: 18 % (ref 12–46)
Lymphs Abs: 1.2 10*3/uL (ref 0.7–4.0)
Lymphs Abs: 1.4 10*3/uL (ref 0.7–4.0)
Monocytes Absolute: 0.7 10*3/uL (ref 0.1–1.0)
Monocytes Absolute: 0.7 10*3/uL (ref 0.1–1.0)
Monocytes Relative: 10 % (ref 3–12)
Monocytes Relative: 11 % (ref 3–12)
Neutro Abs: 4.9 10*3/uL (ref 1.7–7.7)
Neutro Abs: 5.5 10*3/uL (ref 1.7–7.7)
Neutrophils Relative %: 72 % (ref 43–77)
Neutrophils Relative %: 72 % (ref 43–77)

## 2011-02-28 LAB — CROSSMATCH
ABO/RH(D): O POS
Antibody Screen: NEGATIVE

## 2011-02-28 LAB — DIFFERENTIAL
Basophils Absolute: 0 10*3/uL (ref 0.0–0.1)
Basophils Absolute: 0 10*3/uL (ref 0.0–0.1)
Basophils Relative: 0 % (ref 0–1)
Basophils Relative: 1 % (ref 0–1)
Eosinophils Absolute: 0 10*3/uL (ref 0.0–0.7)
Eosinophils Absolute: 0.1 10*3/uL (ref 0.0–0.7)
Eosinophils Relative: 0 % (ref 0–5)
Eosinophils Relative: 1 % (ref 0–5)
Lymphocytes Relative: 19 % (ref 12–46)
Lymphocytes Relative: 29 % (ref 12–46)
Lymphs Abs: 1.2 10*3/uL (ref 0.7–4.0)
Lymphs Abs: 1.4 10*3/uL (ref 0.7–4.0)
Monocytes Absolute: 0.3 10*3/uL (ref 0.1–1.0)
Monocytes Absolute: 0.6 10*3/uL (ref 0.1–1.0)
Monocytes Relative: 6 % (ref 3–12)
Monocytes Relative: 9 % (ref 3–12)
Neutro Abs: 3.1 10*3/uL (ref 1.7–7.7)
Neutro Abs: 4.6 10*3/uL (ref 1.7–7.7)
Neutrophils Relative %: 63 % (ref 43–77)
Neutrophils Relative %: 71 % (ref 43–77)

## 2011-02-28 LAB — PROTIME-INR
INR: 1.03 (ref 0.00–1.49)
Prothrombin Time: 13.7 seconds (ref 11.6–15.2)

## 2011-02-28 LAB — CBC
HCT: 21.6 % — ABNORMAL LOW (ref 36.0–46.0)
HCT: 29.1 % — ABNORMAL LOW (ref 36.0–46.0)
HCT: 36 % (ref 36.0–46.0)
Hemoglobin: 12.1 g/dL (ref 12.0–15.0)
Hemoglobin: 7.4 g/dL — ABNORMAL LOW (ref 12.0–15.0)
Hemoglobin: 9.6 g/dL — ABNORMAL LOW (ref 12.0–15.0)
MCH: 29.1 pg (ref 26.0–34.0)
MCH: 29.9 pg (ref 26.0–34.0)
MCH: 30.3 pg (ref 26.0–34.0)
MCHC: 33 g/dL (ref 30.0–36.0)
MCHC: 33.4 g/dL (ref 30.0–36.0)
MCHC: 34.2 g/dL (ref 30.0–36.0)
MCV: 88.3 fL (ref 78.0–100.0)
MCV: 88.8 fL (ref 78.0–100.0)
MCV: 89.3 fL (ref 78.0–100.0)
Platelets: 132 10*3/uL — ABNORMAL LOW (ref 150–400)
Platelets: 162 10*3/uL (ref 150–400)
Platelets: 187 10*3/uL (ref 150–400)
RBC: 2.44 MIL/uL — ABNORMAL LOW (ref 3.87–5.11)
RBC: 3.29 MIL/uL — ABNORMAL LOW (ref 3.87–5.11)
RBC: 4.04 MIL/uL (ref 3.87–5.11)
RDW: 13.4 % (ref 11.5–15.5)
RDW: 13.6 % (ref 11.5–15.5)
RDW: 13.7 % (ref 11.5–15.5)
WBC: 4.9 10*3/uL (ref 4.0–10.5)
WBC: 6.5 10*3/uL (ref 4.0–10.5)
WBC: 9.9 10*3/uL (ref 4.0–10.5)

## 2011-02-28 LAB — BASIC METABOLIC PANEL
BUN: 17 mg/dL (ref 6–23)
BUN: 20 mg/dL (ref 6–23)
CO2: 30 mEq/L (ref 19–32)
CO2: 31 mEq/L (ref 19–32)
Calcium: 8.6 mg/dL (ref 8.4–10.5)
Calcium: 9.5 mg/dL (ref 8.4–10.5)
Chloride: 102 mEq/L (ref 96–112)
Chloride: 105 mEq/L (ref 96–112)
Creatinine, Ser: 1.07 mg/dL (ref 0.4–1.2)
Creatinine, Ser: 1.18 mg/dL (ref 0.4–1.2)
GFR calc Af Amer: 55 mL/min — ABNORMAL LOW (ref >60)
GFR calc Af Amer: >60 mL/min (ref >60)
GFR calc non Af Amer: 46 mL/min — ABNORMAL LOW (ref >60)
GFR calc non Af Amer: 51 mL/min — ABNORMAL LOW (ref >60)
Glucose, Bld: 110 mg/dL — ABNORMAL HIGH (ref 70–99)
Glucose, Bld: 98 mg/dL (ref 70–99)
Potassium: 3.9 mEq/L (ref 3.5–5.1)
Potassium: 4.1 mEq/L (ref 3.5–5.1)
Sodium: 138 mEq/L (ref 135–145)
Sodium: 139 mEq/L (ref 135–145)

## 2011-02-28 LAB — ABO/RH: ABO/RH(D): O POS

## 2011-02-28 LAB — SURGICAL PCR SCREEN
MRSA, PCR: NEGATIVE
Staphylococcus aureus: NEGATIVE

## 2011-02-28 LAB — APTT: aPTT: 32 seconds (ref 24–37)

## 2011-03-03 ENCOUNTER — Encounter: Payer: Self-pay | Admitting: Orthopedic Surgery

## 2011-03-04 ENCOUNTER — Ambulatory Visit (INDEPENDENT_AMBULATORY_CARE_PROVIDER_SITE_OTHER): Payer: MEDICARE | Admitting: Orthopedic Surgery

## 2011-03-04 ENCOUNTER — Telehealth: Payer: Self-pay | Admitting: Orthopedic Surgery

## 2011-03-04 ENCOUNTER — Telehealth: Payer: Self-pay | Admitting: *Deleted

## 2011-03-04 ENCOUNTER — Encounter: Payer: Self-pay | Admitting: Orthopedic Surgery

## 2011-03-04 DIAGNOSIS — M1610 Unilateral primary osteoarthritis, unspecified hip: Secondary | ICD-10-CM

## 2011-03-04 MED ORDER — HYDROCODONE-ACETAMINOPHEN 5-500 MG PO CAPS: 1.0000 | ORAL_CAPSULE | ORAL | Status: DC | PRN

## 2011-03-04 MED ORDER — TRAMADOL-ACETAMINOPHEN 37.5-325 MG PO TABS: 1.0000 | ORAL_TABLET | ORAL | Status: DC | PRN

## 2011-03-04 NOTE — Discharge Summary (Signed)
Identifiable x-ray report AP, pelvis and AP, lateral, LEFT hip.  The stem  is in good position being range of motion is 20 the abduction is 40.  Impression normal-appearing postoperative hip replacement with notable  arthritis, RIGHT hip

## 2011-03-04 NOTE — Telephone Encounter (Signed)
[facility] shredded rx from [place] today

## 2011-03-04 NOTE — Progress Notes (Signed)
   The patient is 6 months status post LEFT total hip arthroplasty with a Tri-Lock Femoral stem No complaints  She does have a mild limp  She was complaining of knee pain, but says her knee no longer hurts  Exam Her leg lengths are nearly equal with the LEFT being slightly shorter less than 5 mm.  She has 125 of hip flexion.  Muscle tone was normal  The hip was stable. Neuro exam and vascular exam are normal.

## 2011-03-04 NOTE — Telephone Encounter (Signed)
Homero Fellers, pharmacist @[facility], ph [phone], called re: Rx received today from our office: Loricet 5/500 - states "duplicate therapy other prescriber" received per patient's insurance, received at another pharmacy 02/19/11.  Please call.

## 2011-03-04 NOTE — Patient Instructions (Signed)
Return in 6 mos for THA x rays

## 2011-04-29 NOTE — Letter (Signed)
Apr 18, 2008    Patrica Duel, M.D.  [address]  [place], [place] [address]   RE:  TOULA, MIYASAKI  MRN:  [id number]  /  DOB:  August 01, 1941   Dear Loraine Leriche:   Ms. Sramek returns to the office for continued assessment and treatment  of hypertension, hyperlipidemia and a history of syncope.  Since her  last visit, she has been treated for recurrent urinary tract infections.  Otherwise, she has done generally well.  She has had no lightheadedness,  no loss of consciousness and no falls.   CURRENT MEDICATIONS:  1. Levothyroxine 0.025 mg daily.  2. Atorvastatin 20 mg daily.  3. Lisinopril/ HCT 20/12.5 mg daily.   The last laboratory I have for her was a year ago, at which time a TSH  was mildly elevated to 7.1.   On exam, a pleasant overweight woman in no acute distress.  The weight is 182, 1 pound less than at her last visit.  Blood pressure  120/75, heart rate 65 and regular, respirations 13.  NECK:  No jugular venous distention.  Normal carotid upstrokes without  bruits.  LUNGS:  Clear.  CARDIAC:  Normal first and second heart sounds.  Minimal systolic  ejection murmur at the base.  ABDOMEN:  Soft and nontender.  No organomegaly.  EXTREMITIES:  No edema.  Distal pulses normal.   IMPRESSION:  Ms. Pietrzak appears to be doing quite well overall.  Blood  pressure is well-controlled.  I do not have a recent lipid profile.  One  will be obtained along with a chemistry profile and TSH.  If results are  good, I will plan to see this nice woman again in 1 year.    Sincerely,      Gerrit Friends. Dietrich Pates, MD, [facility]  Electronically Signed    RMR/MedQ  DD: 04/18/2008  DT: 04/18/2008  Job #: [id number]

## 2011-05-02 NOTE — Procedures (Signed)
NAMEBERKELEY, VELDMAN NO.:  [id number]   MEDICAL RECORD NO.:  [id number]          PATIENT TYPE:  OUT   LOCATION:  RAD                           FACILITY:  [facility]   PHYSICIAN:  Gerrit Friends. Dietrich Pates, MD, FACCDATE OF BIRTH:  1941-08-16   DATE OF PROCEDURE:  DATE OF DISCHARGE:                                ECHOCARDIOGRAM   REFERRING PHYSICIAN:  Madelin Rear. Sherwood Gambler, M.D.   CLINICAL DATA:  A 70 year old woman with hypertension and EKG  abnormalities.   M-mode aorta 3.1, left atrium 3.6, septum 1.8, posterior wall 1.4, LV  diastole 4.1, LV systole 3.   1. Technically adequate echocardiographic study.  2. Normal left atrium, right atrium, and right ventricle.  3. Mild aortic valvular sclerosis.  4. Normal diameter of the proximal ascending aorta; mild to moderate      calcification of the wall.  5. Normal mitral valve; mild annular calcification.  6. Normal tricuspid valve; normal pulmonic valve; normal proximal      pulmonary artery.  7. Normal left ventricular size; moderate hypertrophy; normal regional      and global function.  8. Normal IVC.  9. Probable patent foramen ovale.  10.Comparison with prior study of January 27, 2001:  No MVP noted on      current examination; no aortic stenosis nor aortic insufficiency;      no segmental wall motion abnormality.      Gerrit Friends. Dietrich Pates, MD, [facility]  Electronically Signed     RMR/MEDQ  D:  02/11/2007  T:  02/11/2007  Job:  [id number]

## 2011-05-02 NOTE — Op Note (Signed)
Carol Rosario, Carol Rosario NO.:  [id number]   MEDICAL RECORD NO.:  [id number]          PATIENT TYPE:  AMB   LOCATION:  DSC                          FACILITY:  [facility]   PHYSICIAN:  Tennis Must Meyerdierks, M.D.DATE OF BIRTH:  15-Mar-1941   DATE OF PROCEDURE:  04/14/2007  DATE OF DISCHARGE:  04/15/2007                               OPERATIVE REPORT   PREOPERATIVE DIAGNOSIS:  Left distal radius fracture with left carpal  tunnel release.   POSTOPERATIVE DIAGNOSIS:  Left distal radius fracture with left carpal  tunnel release.   PROCEDURE:  ORIF left distal radius fracture with left carpal tunnel  release.   SURGEON:  Lowell Bouton, M.D.   ANESTHESIA:  General.   OPERATIVE FINDINGS:  The patient had a 65-week-old displaced distal  radius fracture.  It was mildly comminuted.  She also had significant  numbness in her radial three digits with loss the sweat pattern.  The  median nerve was significantly compressed in the carpal canal.   PROCEDURE:  Under general anesthesia with a tourniquet on the left arm,  the left hand was prepped and draped in usual fashion; after  exsanguinating the limb, the tourniquet was inflated to 150 mmHg.  The  index and long fingers were suspended with finger traps and longitudinal  traction over the end of the hand table using 15 pounds of traction.  A  longitudinal incision was made over the FCR tendon in the distal forearm  and carried down to the FCR sheath.  Bleeding points were coagulated.  The FCR sheath was then opened and the tendon was retracted ulnarly.  The floor of the sheath was then incised.  A Glorious Peach was used to free up  the area over the pronator quadratus and that was released from its  radial attachment.  A Glorious Peach was used to elevate the pronator quadratus  off of the bone and the fracture site was identified.  The fracture was  freed up with a Therapist, nutritional and was reduced.  DDR plate was then  applied to the  volar aspect of the radius using three screws proximally  and four pegs distally.  X-rays revealed that the pegs were very close  to the articular surface.  On the 20 degree view on the lateral it  appeared that the pegs were in the joint.  The plate was then removed  and the fracture was re-reduced and held in position with a 4.5 K-wire.  The traction was then released.  The plate was placed more proximally on  the radius in a similar fashion using 3.5 mm screws proximally and five  pegs distally that were threaded.  X-rays showed good position of the  fracture.  The K-wire was removed.  The fracture appeared to be stable  and the lateral view showed no signs of involvement of the joint.  At  this point, a 3 cm longitudinal incision was made in the palm just ulnar  to the thenar crease and carried through the subcutaneous tissues.  Blunt dissection was carried down through the superficial palmar fascia  distal to the transverse carpal ligament.  A hemostat was placed in the  carpal canal up against the hook of the hamate and the transverse carpal  ligament was divided on the ulnar border of median nerve.  The proximal  end of the ligament was divided with the scissors connecting the two  areas that were opened.  There was complete release of the median nerve  in the forearm fascia.  The wounds were then irrigated copiously with  saline.  The skin was closed in the palm with 4-0 nylon sutures pronator  was reattached in the wrist with 4-0 Vicryl and a TLS drain was  inserted.  Subcutaneous tissue was closed with 4-0 Vicryl and the skin with a 3-0  subcuticular Prolene.  Steri-Strips were applied, 0.50% Marcaine was  placed in the skin edges for pain control.  The patient was placed in a  volar wrist splint.  She tolerated the procedure well and went to the  recovery room awake in stable and condition.      Lowell Bouton, M.D.  Electronically Signed     EMM/MEDQ  D:   04/15/2007  T:  04/15/2007  Job:  [id number]   cc:   Teola Bradley, M.D.

## 2011-05-02 NOTE — Letter (Signed)
April 08, 2007    Patrica Duel, M.D.  [address], [address]  [place], [place] [address]   RE:  Carol, Rosario  MRN:  [id number]  /  DOB:  04-12-1941   Dear Loraine Leriche:   Carol Rosario returns to the office for continued assessment and treatment  of hypertension and hyperlipidemia.  Since her last visit, no laboratory  values have been located.  She was seen in the emergency department 2  weeks ago for a fall.  Despite identifying 7 factors in the patient's  history, the ER doctor apparently failed to consider whether this was a  syncopal episode or not.  The patient describes getting up to change a  light, and then suddenly finding herself on the floor, consistent with  loss of consciousness.  No laboratories were obtained.  She had a  fracture of her left radius that has been casted.   CURRENT MEDICATIONS:  Include:  1. Levothyroxine 0.025 mg daily.  2. Atorvastatin 20 mg daily.  3. Lisinopril/hydrochlorothiazide 20/12.5 mg daily.   A chemistry profile obtained 1 month ago revealed normal electrolytes,  slightly elevated glucose of 111, slightly elevated BUN of 31, and a  creatinine of 0.9.   EXAMINATION:  A pleasant overweight woman, in no acute distress.  The blood pressure is 120/80 lying, and 114/74 standing.  Heart rate is  82 and regular.  Respirations 16.  HEENT:  Anicteric sclerae.  EOMs full.  NECK:  No jugular venous distention.  No carotid bruits.  LUNGS:  Decreased breath sounds in the bases, otherwise clear.  CARDIAC:  Normal 1s and 2nd heart sounds.  Minimal basilar systolic  ejection murmur.  ABDOMEN:  Soft and non-tender.  No organomegaly.  EXTREMITIES:  Trace edema.  Distal pulses intact.   IMPRESSION:  Carol Rosario is doing well with respect to hypertension.  I  am concerned about this fall, but do not think that event recording  would be worthwhile unless she has a recurrent spell.  We will examine  some blood tests, including a CBC, complete metabolic profile,  TSH, and  urinalysis, both for evaluation of syncope and for hypertension.  Stool  will be sampled for Hemoccult testing.  The patient is requested to call  our office immediately for dizziness, lightheadedness, or recurrent  falls, or syncope.  We will plan to see her again in 6 months routinely.    Sincerely,      Gerrit Friends. Dietrich Pates, MD, [facility]  Electronically Signed    RMR/MedQ  DD: 04/08/2007  DT: 04/08/2007  Job #: [id number]

## 2011-05-02 NOTE — Letter (Signed)
February 05, 2007    Patrica Duel, M.D.  [address], [address]  [place], [place] [address]   RE:  Carol, Rosario  MRN:  [id number]  /  DOB:  02/21/1941   Dear Loraine Leriche:   It was my pleasure evaluating Carol Rosario in the office today at your  request in consultation for EKG abnormalities.  As you know, this nice  woman was seen by Dr. Daleen Squibb 6 years ago for assessment of cardiovascular  risk.  An echocardiogram revealed aortic sclerosis, mild mitral valve  prolapse with mild-to-moderate regurgitation, and dyssynchronous pattern  of contraction with distal, septal, and anterior wall hypokinesis.  Continued medical therapy for hypertension and hyperlipidemia was  advised.  Subsequently, Carol Rosario was lost to followup due to loss of  her insurance and inability to pay for care and medications.  She has  recently reached an age when she is eligible for Medicare, prompting her  to re-establish in your office.  She was found to have EKG changes;  specifically, she has a right bundle branch block and left anterior  fascicular block that were not present in 2002.  The patient describes  occasional palpitations, but no chest discomfort.  Her blood pressure  was acceptable when she was seen in your office, but it has previously  been elevated.   PAST MEDICAL HISTORY:  Is otherwise notable for cholecystectomy,  hysterectomy, tubal ligation, and bladder resuspension.   The patient has smoked 1-2 packs of cigarettes per day, but claims not  to inhale any significant amount of smoke.  She asserts that this is a  habit, rather than a pharmacologic issue.   SOCIAL HISTORY:  Retired from work at [employer]; seven adult children.  No  excessive use of alcohol.   FAMILY HISTORY:  Father died in his 24s with coronary disease.  Mother  died at age 57 with neoplastic disease.  She has 5 sisters, one who is  deceased related to cancer and one who has hypertension and diabetes.   EXAMINATION:  On exam,  pleasant, somewhat Carol woman in no acute  distress.  The weight is 184, unchanged from 6 years ago.  Blood  pressure 165/95, heart rate 95 and regular, respirations 16.  NECK:  No jugular venous distention; normal carotid upstrokes without  bruits.  HEENT:  Grade 1 hypertensive changes on funduscopic exam.  ENDOCRINE:  No thyromegaly.  HEMATOPOIETIC:  No adenopathy.  SKIN:  No significant lesions.  CARDIAC:  Normal first and second heart sounds; grade 2/6 basilar  systolic ejection murmur.  LUNGS:  Clear.  ABDOMEN:  Soft and nontender; normal bowel sounds; no masses; no  organomegaly.  EXTREMITIES:  No edema; distal pulses intact.  NEUROMUSCULAR:  Symmetric strength and tone;  normal cranial nerves.  MUSCULOSKELETAL:  No joint deformities.   EKG:  Normal sinus rhythm; right bundle branch block; delayed R wave  progression; left anterior fascicular block.  When compared to a prior  tracing of January 22, 2001, the previous EKG was entirely normal.   IMPRESSION:  Carol Rosario is doing well from a symptomatic standpoint.  She has conduction system disease, but no further assessment is  warranted based upon her developing a right bundle branch block.  She  does have a somewhat increased risk of coronary disease, but this was  already apparent based upon her use of tobacco products.  She was  strongly encouraged to stop smoking, but does not appear ready to  undertake that effort.  We  will obtain copies of recent laboratory  performed in your office.  Since her blood pressure is elevated and she  required antihypertensive medication in the past, we will start  lisinopril/HCT 20/12.5 mg daily.  A chemistry profile will be checked in  one month.  She will return in 2 months with a list of blood pressure  determinations.  Her echocardiogram will be repeated primarily to  reassess whether or not a wall motion abnormality is present.  Thanks  for allowing our practice to reassess this nice  woman.    Sincerely,      Gerrit Friends. Dietrich Pates, MD, [facility]  Electronically Signed    RMR/MedQ  DD: 02/05/2007  DT: 02/05/2007  Job #: [id number]

## 2011-05-02 NOTE — Op Note (Signed)
   NAME:  Carol Rosario, SKILLING                           ACCOUNT NO.:  [id number]   MEDICAL RECORD NO.:  [id number]                   PATIENT TYPE:  AMB   LOCATION:  DSC                                  FACILITY:  [facility]   PHYSICIAN:  Marlowe Kays, M.D.               DATE OF BIRTH:  [birth year]   DATE OF PROCEDURE:  06/16/2003  DATE OF DISCHARGE:                                 OPERATIVE REPORT   PREOPERATIVE DIAGNOSIS:  Torn lateral meniscus, left knee.   POSTOPERATIVE DIAGNOSIS:  Torn lateral meniscus, left knee.   OPERATION:  Left knee arthroscopy with partial lateral meniscectomy and  shaving of medial femoral condyle.   SURGEON:  Marlowe Kays, M.D.   ASSISTANT:  Nurse.   ANESTHESIA:  General.   PATHOLOGY AND JUSTIFICATION FOR PROCEDURE:  She has had a many-months'  history of pain, swelling in the left knee with an MRI demonstrating  apparent tear of the lateral meniscus.  Consequently, she is here for  treatment today.  See operative description below.   PROCEDURE:  Satisfactory general anesthesia, pneumatic tourniquet, thigh  stabilizer.  The left knee was prepped with Duraprep, draped in a sterile  field.  Superior medial saline inflow first through an anterolateral portal.  The medial compartment of the knee joint was evaluated.  There was a little  bit of synovitis present medially, which I resected.  She had some wear of  her medial femoral condyle, which I smoothed down.  The medial meniscus was  normal on probing.  Looking in the suprapatellar area, her patella showed  some minimal wear but there is nothing that is arthroscopically treatable.  I then reversed portals laterally.  She did have some moderate fraying in  the mid-third of the lateral meniscus, but I could find no tear anywhere  else.  This was shaved down until smooth with a 3.5 shaver.  The knee joint  was then irrigated until clear and all fluid possible removed and the two  anterior portals closed with  4-0 nylon.  Marcaine 0.5% with adrenalin 20 mL  was instilled through the inflow apparatus, which was removed and this  portal closed with 4-0 nylon as well.  Betadine and Adaptic dry sterile  dressing were applied, the tourniquet was released.  She tolerated the  procedure well and at the time of dictation was on her way to the recovery  room in satisfactory condition with no known complications.                                                Marlowe Kays, M.D.    JA/MEDQ  D:  06/16/2003  T:  06/17/2003  Job:  [id number]

## 2011-09-09 ENCOUNTER — Encounter: Payer: Self-pay | Admitting: Orthopedic Surgery

## 2011-09-09 ENCOUNTER — Ambulatory Visit (INDEPENDENT_AMBULATORY_CARE_PROVIDER_SITE_OTHER): Payer: Medicare Other | Admitting: Orthopedic Surgery

## 2011-09-09 DIAGNOSIS — M1610 Unilateral primary osteoarthritis, unspecified hip: Secondary | ICD-10-CM

## 2011-09-09 DIAGNOSIS — M179 Osteoarthritis of knee, unspecified: Secondary | ICD-10-CM | POA: Insufficient documentation

## 2011-09-09 DIAGNOSIS — IMO0002 Reserved for concepts with insufficient information to code with codable children: Secondary | ICD-10-CM

## 2011-09-09 DIAGNOSIS — M1710 Unilateral primary osteoarthritis, unspecified knee: Secondary | ICD-10-CM

## 2011-09-09 MED ORDER — METHYLPREDNISOLONE ACETATE 40 MG/ML IJ SUSP: 40.0000 mg | Freq: Once | INTRAMUSCULAR | Status: DC

## 2011-09-09 MED ORDER — HYDROCODONE-ACETAMINOPHEN 5-500 MG PO CAPS: 1.0000 | ORAL_CAPSULE | Freq: Four times a day (QID) | ORAL | Status: DC | PRN

## 2011-09-09 NOTE — Progress Notes (Signed)
History of Present Illness:  this 70 year old female following up from a total hip replacement  DOS 08-13-10. Left total hip arthroplasty.  1 year from surgery: Annual xrays   C/o knee pain Bilateral  Annual x-rays show stable prosthesis.  Tri-Lock implant.  No evidence of loosening.  Both knees were injected.  RIGHT knee injection and LEFT knee injection  Knee  Injection Procedure Note  Pre-operative Diagnosis: left knee oa  Post-operative Diagnosis: same  Indications: pain  Anesthesia: ethyl chloride   Procedure Details   Verbal consent was obtained for the procedure. Time out was completed.The joint was prepped with alcohol, followed by  Ethyl chloride spray and A 20 gauge needle was inserted into the knee via lateral approach; 4ml 1% lidocaine and 1 ml of depomedrol  was then injected into the joint . The needle was removed and the area cleansed and dressed.  Complications:  None; patient tolerated the procedure well.  Knee  Injection Procedure Note  Pre-operative Diagnosis: right knee oa  Post-operative Diagnosis: same  Indications: pain  Anesthesia: ethyl chloride   Procedure Details   Verbal consent was obtained for the procedure. Time out was completed.The joint was prepped with alcohol, followed by  Ethyl chloride spray and A 20 gauge needle was inserted into the knee via lateral approach; 4ml 1% lidocaine and 1 ml of depomedrol  was then injected into the joint . The needle was removed and the area cleansed and dressed.  Complications:  None; patient tolerated the procedure well.   Separate x-ray report.  AP, pelvis and AP, lateral, and crosstable LEFT hip.  Femoral component alignment. Neutral, acetabular component, alignment, 20 anteversion. No loosening.  Impression normal postoperative hip film at one year

## 2011-09-29 ENCOUNTER — Other Ambulatory Visit (HOSPITAL_COMMUNITY): Payer: Self-pay | Admitting: Internal Medicine

## 2011-09-29 DIAGNOSIS — Z139 Encounter for screening, unspecified: Secondary | ICD-10-CM

## 2011-09-29 DIAGNOSIS — Z01419 Encounter for gynecological examination (general) (routine) without abnormal findings: Secondary | ICD-10-CM

## 2011-10-07 ENCOUNTER — Ambulatory Visit (HOSPITAL_COMMUNITY)
Admission: RE | Admit: 2011-10-07 | Discharge: 2011-10-07 | Disposition: A | Discharge status: Home / Self Care | Payer: Medicare Other | Source: Ambulatory Visit | Attending: Internal Medicine | Admitting: Internal Medicine

## 2011-10-07 DIAGNOSIS — Z139 Encounter for screening, unspecified: Secondary | ICD-10-CM

## 2011-10-07 DIAGNOSIS — Z01419 Encounter for gynecological examination (general) (routine) without abnormal findings: Secondary | ICD-10-CM

## 2011-10-07 DIAGNOSIS — M899 Disorder of bone, unspecified: Secondary | ICD-10-CM | POA: Insufficient documentation

## 2011-10-07 DIAGNOSIS — Z1231 Encounter for screening mammogram for malignant neoplasm of breast: Secondary | ICD-10-CM | POA: Insufficient documentation

## 2011-10-07 DIAGNOSIS — Z780 Asymptomatic menopausal state: Secondary | ICD-10-CM | POA: Insufficient documentation

## 2011-10-07 DIAGNOSIS — M949 Disorder of cartilage, unspecified: Secondary | ICD-10-CM | POA: Insufficient documentation

## 2011-10-22 ENCOUNTER — Other Ambulatory Visit: Payer: Self-pay | Admitting: Adult Health

## 2011-10-23 ENCOUNTER — Encounter: Payer: Self-pay | Admitting: Orthopedic Surgery

## 2011-10-23 ENCOUNTER — Ambulatory Visit (INDEPENDENT_AMBULATORY_CARE_PROVIDER_SITE_OTHER): Payer: Medicare Other | Admitting: Orthopedic Surgery

## 2011-10-23 VITALS — BP 110/64 | Ht 65.0 in | Wt 160.0 lb

## 2011-10-23 DIAGNOSIS — M25559 Pain in unspecified hip: Secondary | ICD-10-CM | POA: Insufficient documentation

## 2011-10-23 DIAGNOSIS — M169 Osteoarthritis of hip, unspecified: Secondary | ICD-10-CM | POA: Insufficient documentation

## 2011-10-23 NOTE — Progress Notes (Signed)
Problem  [facility] and referring physicians.  Chief complaint pain RIGHT hip.  Pain started October 28, secondary to a fall. Pain located over the RIGHT greater trochanter and RIGHT lateral thigh described as sharp, 5/10, intermittent, worse at night and worse with standing and walking. Surprisingly minimal groin pain.  Review of systems negative except for easy bruising.  Status post LEFT total hip arthroplasty.  Past Medical History  Diagnosis Date  . Hypertension   . Hyperlipidemia   . Palpitations   . Family history of tobacco abuse and dependence   . Obesity   . DJD (degenerative joint disease)   . Degeneration of lumbar or lumbosacral intervertebral disc   . Sciatica   . History of recurrent UTIs    Past Surgical History  Procedure Date  . Cataract extraction, bilateral   . Cholecystectomy   . Bilateral tubal ligation   . Vesicovaginal fistula closure w/ tah   . Bladder resuspension   . Hip surgery   . Knee surgery    Physical Exam(12) GENERAL: normal development   CDV: pulses are normal   Skin: normal  Lymph: nodes were not palpable/normal  Psychiatric: awake, alert and oriented  Neuro: normal sensation  MSK uses a cane no limp  1 no pain with hip flexion, tender over greater trochanter  2 Range of motion is normal. Hip stability is normal. Strength is normal. Skin is normal.  Cardiovascular exam shows no edema or swelling.  Lymph nodes negative in the groin.  Sensation normal in the RIGHT leg.  Left reflexes  Balance normal.  Separate x-ray report AP, lateral, RIGHT hip for RIGHT hip pain.  Findings there is joint space narrowing and proximal migration of the femoral head consistent with degenerative changes. No fractures seen.  Impression osteoarthritis, RIGHT hip  Assessment: Contusion right hip  OA right hip     Plan: Observation

## 2012-05-01 ENCOUNTER — Other Ambulatory Visit: Payer: Self-pay | Admitting: Adult Health

## 2012-05-06 ENCOUNTER — Encounter: Payer: Self-pay | Admitting: Cardiology

## 2012-05-18 ENCOUNTER — Ambulatory Visit (INDEPENDENT_AMBULATORY_CARE_PROVIDER_SITE_OTHER): Payer: Medicare Other | Admitting: Adult Health

## 2012-05-18 ENCOUNTER — Encounter: Payer: Self-pay | Admitting: Adult Health

## 2012-05-18 VITALS — BP 102/60 | HR 72 | Ht 66.0 in | Wt 157.1 lb

## 2012-05-18 DIAGNOSIS — I10 Essential (primary) hypertension: Secondary | ICD-10-CM

## 2012-05-18 DIAGNOSIS — E782 Mixed hyperlipidemia: Secondary | ICD-10-CM

## 2012-05-18 DIAGNOSIS — Z7901 Long term (current) use of anticoagulants: Secondary | ICD-10-CM

## 2012-05-18 MED ORDER — LISINOPRIL 20 MG PO TABS: 20.0000 mg | ORAL_TABLET | Freq: Every day | ORAL | Status: DC

## 2012-05-18 NOTE — Assessment & Plan Note (Signed)
Excellent control of BP, actually on low side. With complaints of leg cramps, I will d/c HCTZ portion of zestoretic and continue just zestoril 20 mg only. I will have CMET completed along with CBC. She will also have lipids completed as they have not been checked this year. Hopefully she will have less complaint of leg cramping without use of diuretic. I will consider adding magnesium to her regimen if labs are ok.

## 2012-05-18 NOTE — Patient Instructions (Signed)
**Note De-Identified  Obfuscation** Your physician recommends that you return for lab work in: By June 8  Your physician has recommended you make the following change in your medication: Stop taking Zesteretic and start taking Zestril 20 mg daily  Your physician recommends that you schedule a follow-up appointment in: 6 months

## 2012-05-18 NOTE — Progress Notes (Signed)
   HPI: Carol Rosario is a pleasant 71 y/o patient of Dr. Dietrich Pates that we follow for ongoing assessment and treatment of hypertension and hypercholesterolemia. She comes today for annual appointment. She has had no hospitalizations or ER visits. No new diagnosis. She is at home caring for her husband, Carol Rosario, one of our patients, who is now in [facility]. Her only complaint is legs cramps, mostly occuring at night in her thighs and calves. No swelling or coolness.  She denies chest pain or DOE. She is followed by Dr. Sherwood Gambler, PCP.  Allergies  Allergen Reactions  . Codeine     Current Outpatient Prescriptions  Medication Sig Dispense Refill  . aspirin 81 MG tablet Take 81 mg by mouth daily.      . hydrocodone-acetaminophen (LORCET-HD) 5-500 MG per capsule Take 1 capsule by mouth every 6 (six) hours as needed. Take one tablet by mouth as needed for pain at bedtime  60 capsule  5  . levothyroxine (SYNTHROID, LEVOTHROID) 50 MCG tablet Take 50 mcg by mouth daily.       Current Facility-Administered Medications  Medication Dose Route Frequency Provider Last Rate Last Dose  . methylPREDNISolone acetate (DEPO-MEDROL) injection 40 mg  40 mg Intra-articular Once Vickki Hearing, MD      . methylPREDNISolone acetate (DEPO-MEDROL) injection 40 mg  40 mg Intra-articular Once Vickki Hearing, MD        Past Medical History  Diagnosis Date  . Hypertension   . Hyperlipidemia   . Palpitations   . Family history of tobacco abuse and dependence   . Obesity   . DJD (degenerative joint disease)   . Degeneration of lumbar or lumbosacral intervertebral disc   . Sciatica   . History of recurrent UTIs     Past Surgical History  Procedure Date  . Cataract extraction, bilateral   . Cholecystectomy   . Bilateral tubal ligation   . Vesicovaginal fistula closure w/ tah   . Bladder resuspension   . Hip surgery   . Knee surgery     ROS Review of systems complete and found to be negative unless  listed above  PHYSICAL EXAM BP 102/60  Pulse 72  Ht 5\' 6"  (1.676 m)  Wt 157 lb 1.9 oz (71.269 kg)  BMI 25.36 kg/m2  General: Well developed, well nourished, in no acute distress Head: Eyes PERRLA, No xanthomas.   Normal cephalic and atramatic  Lungs: Clear bilaterally to auscultation and percussion. Heart: HRRR S1 S2, without MRG occasional skipped beat.  Pulses are 2+ & equal.            No carotid bruit. No JVD.  No abdominal bruits. No femoral bruits. Abdomen: Bowel sounds are positive, abdomen soft and non-tender without masses or                  Hernia's noted. Msk:  Back normal, normal gait. Normal strength and tone for age. Extremities: No clubbing, cyanosis or edema.  DP +1 Neuro: Alert and oriented X 3. Psych:  Good affect, responds appropriately    ASSESSMENT AND PLAN

## 2012-05-24 LAB — COMPREHENSIVE METABOLIC PANEL
ALT: 12 U/L (ref 0–35)
AST: 16 U/L (ref 0–37)
Albumin: 4.3 g/dL (ref 3.5–5.2)
Alkaline Phosphatase: 57 U/L (ref 39–117)
BUN: 37 mg/dL — ABNORMAL HIGH (ref 6–23)
CO2: 27 mEq/L (ref 19–32)
Calcium: 9.5 mg/dL (ref 8.4–10.5)
Chloride: 102 mEq/L (ref 96–112)
Creat: 1.35 mg/dL — ABNORMAL HIGH (ref 0.50–1.10)
Glucose, Bld: 87 mg/dL (ref 70–99)
Potassium: 5.3 mEq/L (ref 3.5–5.3)
Sodium: 137 mEq/L (ref 135–145)
Total Bilirubin: 0.3 mg/dL (ref 0.3–1.2)
Total Protein: 6.8 g/dL (ref 6.0–8.3)

## 2012-05-24 LAB — CBC WITH DIFFERENTIAL/PLATELET
Basophils Absolute: 0.1 10*3/uL (ref 0.0–0.1)
Basophils Relative: 1 % (ref 0–1)
Eosinophils Absolute: 0.1 10*3/uL (ref 0.0–0.7)
Eosinophils Relative: 2 % (ref 0–5)
HCT: 36.8 % (ref 36.0–46.0)
Hemoglobin: 11.8 g/dL — ABNORMAL LOW (ref 12.0–15.0)
Lymphocytes Relative: 34 % (ref 12–46)
Lymphs Abs: 1.7 10*3/uL (ref 0.7–4.0)
MCH: 27.5 pg (ref 26.0–34.0)
MCHC: 32.1 g/dL (ref 30.0–36.0)
MCV: 85.8 fL (ref 78.0–100.0)
Monocytes Absolute: 0.3 10*3/uL (ref 0.1–1.0)
Monocytes Relative: 6 % (ref 3–12)
Neutro Abs: 2.8 10*3/uL (ref 1.7–7.7)
Neutrophils Relative %: 57 % (ref 43–77)
Platelets: 243 10*3/uL (ref 150–400)
RBC: 4.29 MIL/uL (ref 3.87–5.11)
RDW: 15.1 % (ref 11.5–15.5)
WBC: 4.9 10*3/uL (ref 4.0–10.5)

## 2012-05-24 LAB — LIPID PANEL
Cholesterol: 243 mg/dL — ABNORMAL HIGH (ref 0–200)
HDL: 39 mg/dL — ABNORMAL LOW (ref >39)
LDL Cholesterol: 146 mg/dL — ABNORMAL HIGH (ref 0–99)
Total CHOL/HDL Ratio: 6.2 Ratio
Triglycerides: 288 mg/dL — ABNORMAL HIGH (ref <150)
VLDL: 58 mg/dL — ABNORMAL HIGH (ref 0–40)

## 2012-05-26 ENCOUNTER — Telehealth: Payer: Self-pay

## 2012-05-26 MED ORDER — SIMVASTATIN 40 MG PO TABS: 40.0000 mg | ORAL_TABLET | Freq: Every day | ORAL | Status: DC

## 2012-05-26 NOTE — Telephone Encounter (Signed)
**Note De-Identified  Obfuscation** Pt. Advised, she verbalized understanding. RX sent to [facility] to fill and deliver./LV

## 2012-05-26 NOTE — Telephone Encounter (Signed)
**Note De-Identified Sahil Milner Obfuscation** Message copied by Demetrios Loll on Wed May 26, 2012  8:56 AM ------      Message from: Jodelle Gross      Created: Tue May 25, 2012 10:00 AM       Begin simvastatin 40 mg daily with Fish Oil daily. Send results to Dr. Felecia Shelling

## 2012-07-29 ENCOUNTER — Encounter: Payer: Self-pay | Admitting: Gastroenterology

## 2012-07-29 ENCOUNTER — Ambulatory Visit (INDEPENDENT_AMBULATORY_CARE_PROVIDER_SITE_OTHER): Payer: Medicare Other | Admitting: Gastroenterology

## 2012-07-29 VITALS — BP 134/81 | HR 80 | Temp 98.2°F | Ht 65.0 in | Wt 152.6 lb

## 2012-07-29 DIAGNOSIS — D649 Anemia, unspecified: Secondary | ICD-10-CM

## 2012-07-29 DIAGNOSIS — R195 Other fecal abnormalities: Secondary | ICD-10-CM

## 2012-07-29 MED ORDER — PEG 3350-KCL-NA BICARB-NACL 420 G PO SOLR: 4000.0000 L | ORAL | Status: AC

## 2012-07-29 NOTE — Patient Instructions (Addendum)
Please have blood work completed. We will call you with results.   We have set you up for a colonoscopy and possible upper endoscopy in the near future.   Further recommendations to follow.

## 2012-07-29 NOTE — Progress Notes (Addendum)
Primary Care Physician:  Cassell Smiles., MD Primary Gastroenterologist:  DR. Jena Gauss  Chief Complaint  Patient presents with  . Rectal Bleeding    HPI:   Ms. Carol Rosario presents today at the request of Dr. Phillips Odor secondary to heme + stools. No evidence of rectal bleeding. No black, tarry stool. No abdominal pain. States umbilicus "runs". Notes "discharge" at times, has to wipe with toilet paper. States chronic. Clear fluid. Malodorous. No prior colonoscopy per pt. No reflux, dysphagia. Deals with constipation, takes something like MOM every other day with good results. Used to weigh around 180s, down to 150s over past 3-4 years. Unintentional but trying to maintain.   Husband passed away first of July 16, 2023 this year. Had heart trouble. Would have been married 54 years next month. Daughter passed away a year ago from heart trouble as well.   Lab Results  Component Value Date   WBC 5.8 07/30/2012   HGB 11.1* 07/30/2012   HCT 33.8* 07/30/2012   MCV 81.4 07/30/2012   PLT 260 07/30/2012    Past Medical History  Diagnosis Date  . Hypertension   . Hyperlipidemia   . Palpitations   . Family history of tobacco abuse and dependence   . Obesity   . DJD (degenerative joint disease)   . Degeneration of lumbar or lumbosacral intervertebral disc   . Sciatica   . History of recurrent UTIs     Past Surgical History  Procedure Date  . Cataract extraction, bilateral   . Cholecystectomy   . Bilateral tubal ligation   . Vesicovaginal fistula closure w/ tah   . Bladder resuspension   . Hip surgery   . Knee surgery     Current Outpatient Prescriptions  Medication Sig Dispense Refill  . ALPRAZolam (XANAX) 0.5 MG tablet Take 0.25 mg by mouth at bedtime as needed.       Marland Kitchen aspirin 81 MG tablet Take 81 mg by mouth daily.      . fish oil-omega-3 fatty acids 1000 MG capsule Take 2 g by mouth daily.      . hydrocodone-acetaminophen (LORCET-HD) 5-500 MG per capsule Take 1 capsule by mouth every 6 (six)  hours as needed. Take one tablet by mouth as needed for pain at bedtime  60 capsule  5  . levothyroxine (SYNTHROID, LEVOTHROID) 50 MCG tablet Take 50 mcg by mouth daily.      Marland Kitchen lisinopril (PRINIVIL,ZESTRIL) 20 MG tablet Take 1 tablet (20 mg total) by mouth daily.  90 tablet  1  . Omega-3 Fatty Acids (FISH OIL) 1000 MG CAPS Take 1,000 mg by mouth at bedtime.      . simvastatin (ZOCOR) 40 MG tablet Take 1 tablet (40 mg total) by mouth at bedtime.  90 tablet  1   Current Facility-Administered Medications  Medication Dose Route Frequency Provider Last Rate Last Dose  . methylPREDNISolone acetate (DEPO-MEDROL) injection 40 mg  40 mg Intra-articular Once Vickki Hearing, MD      . methylPREDNISolone acetate (DEPO-MEDROL) injection 40 mg  40 mg Intra-articular Once Vickki Hearing, MD        Allergies as of 07/29/2012 - Review Complete 07/29/2012  Allergen Reaction Noted  . Codeine Itching     Family History  Problem Relation Age of Onset  . Colon cancer Neg Hx     History   Social History  . Marital Status: Married    Spouse Name: N/A    Number of Children: N/A  . Years of Education: N/A  Occupational History  . Not on file.   Social History Main Topics  . Smoking status: Former Smoker    Types: Cigarettes    Quit date: 08/13/2010  . Smokeless tobacco: Not on file  . Alcohol Use: No  . Drug Use: Not on file  . Sexually Active: Not on file   Other Topics Concern  . Not on file   Social History Narrative  . No narrative on file    Review of Systems: Gen: Denies any fever, chills, fatigue, weight loss, lack of appetite.  CV: Denies chest pain, heart palpitations, peripheral edema, syncope.  Resp: Denies shortness of breath at rest or with exertion. Denies wheezing or cough.  GI: Denies dysphagia or odynophagia. Denies jaundice, hematemesis, fecal incontinence. GU : Denies urinary burning, urinary frequency, urinary hesitancy MS: knee pain Derm: Denies rash,  itching, dry skin Psych: Denies depression, anxiety, memory loss, and confusion Heme: Denies bruising, bleeding, and enlarged lymph nodes.  Physical Exam: BP 134/81  Pulse 80  Temp 98.2 F (36.8 C) (Temporal)  Ht 5\' 5"  (1.651 m)  Wt 152 lb 9.6 oz (69.219 kg)  BMI 25.39 kg/m2 General:   Alert and oriented. Pleasant and cooperative. Well-nourished and well-developed.  Head:  Normocephalic and atraumatic. Eyes:  Without icterus, sclera clear and conjunctiva pink.  Ears:  Normal auditory acuity. Nose:  No deformity, discharge,  or lesions. Mouth:  No deformity or lesions, oral mucosa pink.  Neck:  Supple, without mass or thyromegaly. Lungs:  Clear to auscultation bilaterally. No wheezes, rales, or rhonchi. No distress.  Heart:  S1, S2 present without murmurs appreciated.  Abdomen:  +BS, soft, non-tender and non-distended. No HSM noted. No guarding or rebound. No masses appreciated.  Rectal:  Deferred  Msk:  Symmetrical without gross deformities. Normal posture. Extremities:  Without clubbing or edema. Neurologic:  Alert and  oriented x4;  grossly normal neurologically. Skin:  Intact without significant lesions or rashes. Cervical Nodes:  No significant cervical adenopathy. Psych:  Alert and cooperative. Normal mood and affect.

## 2012-07-30 LAB — CBC WITH DIFFERENTIAL/PLATELET
Basophils Absolute: 0.1 10*3/uL (ref 0.0–0.1)
Basophils Relative: 1 % (ref 0–1)
Eosinophils Absolute: 0.1 10*3/uL (ref 0.0–0.7)
Eosinophils Relative: 2 % (ref 0–5)
HCT: 33.8 % — ABNORMAL LOW (ref 36.0–46.0)
Hemoglobin: 11.1 g/dL — ABNORMAL LOW (ref 12.0–15.0)
Lymphocytes Relative: 27 % (ref 12–46)
Lymphs Abs: 1.6 10*3/uL (ref 0.7–4.0)
MCH: 26.7 pg (ref 26.0–34.0)
MCHC: 32.8 g/dL (ref 30.0–36.0)
MCV: 81.4 fL (ref 78.0–100.0)
Monocytes Absolute: 0.5 10*3/uL (ref 0.1–1.0)
Monocytes Relative: 9 % (ref 3–12)
Neutro Abs: 3.6 10*3/uL (ref 1.7–7.7)
Neutrophils Relative %: 61 % (ref 43–77)
Platelets: 260 10*3/uL (ref 150–400)
RBC: 4.15 MIL/uL (ref 3.87–5.11)
RDW: 14 % (ref 11.5–15.5)
WBC: 5.8 10*3/uL (ref 4.0–10.5)

## 2012-07-30 LAB — FERRITIN: Ferritin: 4 ng/mL — ABNORMAL LOW (ref 10–291)

## 2012-07-30 LAB — IRON: Iron: 30 ug/dL — ABNORMAL LOW (ref 42–145)

## 2012-08-05 NOTE — Progress Notes (Signed)
Quick Note:  Ferritin low, 4 Iron low at 30 Hgb 11.1, slightly drifted down from 11.8 in June 2013.  IDA. Continue with plans for TCS/EGD. ? Anticipate capsule study if no findings to explain IDA. ______

## 2012-08-06 NOTE — Progress Notes (Signed)
Quick Note:  Pt is aware. ______ 

## 2012-08-09 DIAGNOSIS — R195 Other fecal abnormalities: Secondary | ICD-10-CM | POA: Insufficient documentation

## 2012-08-09 DIAGNOSIS — D649 Anemia, unspecified: Secondary | ICD-10-CM | POA: Insufficient documentation

## 2012-08-09 NOTE — Assessment & Plan Note (Signed)
CBC, iron, ferritin. TCS and possible EGD as planned. If continued anemia, evidence of iron deficiency and no source from lower or upper evaluation, consider capsule study.

## 2012-08-09 NOTE — Progress Notes (Signed)
Faxed to PCP

## 2012-08-09 NOTE — Assessment & Plan Note (Signed)
71 year old female with heme + stool through PCP's office. No upper or lower GI symptoms currently. Notes approximately 30 lbs wt loss of past several years, unintentional. Mild normocytic anemia noted on recent labs as above. Will update CBC, iron, ferritin. No prior colonoscopy. Proceed with TCS+/- EGD in near future.   Proceed with colonoscopy, possible upper endoscopy with Dr. Darrick Penna in the near future. The risks, benefits, and alternatives have been discussed in detail with the patient. They state understanding and desire to proceed.

## 2012-08-13 ENCOUNTER — Encounter (HOSPITAL_COMMUNITY): Payer: Self-pay | Admitting: Pharmacy Technician

## 2012-08-15 DIAGNOSIS — C801 Malignant (primary) neoplasm, unspecified: Secondary | ICD-10-CM

## 2012-08-15 HISTORY — DX: Malignant (primary) neoplasm, unspecified: C80.1

## 2012-08-25 MED ORDER — SODIUM CHLORIDE 0.45 % IV SOLN
INTRAVENOUS | Status: DC
  Administered 2012-08-26: 1000 mL via INTRAVENOUS

## 2012-08-26 ENCOUNTER — Encounter (HOSPITAL_COMMUNITY): Payer: Self-pay | Admitting: *Deleted

## 2012-08-26 ENCOUNTER — Other Ambulatory Visit: Payer: Self-pay | Admitting: Gastroenterology

## 2012-08-26 ENCOUNTER — Ambulatory Visit (HOSPITAL_COMMUNITY)
Admission: RE | Admit: 2012-08-26 | Discharge: 2012-08-26 | Disposition: A | Discharge status: Home / Self Care | Payer: Medicare Other | Source: Ambulatory Visit | Attending: Internal Medicine | Admitting: Internal Medicine

## 2012-08-26 ENCOUNTER — Telehealth: Payer: Self-pay | Admitting: General Practice

## 2012-08-26 ENCOUNTER — Encounter (HOSPITAL_COMMUNITY)
Admission: RE | Disposition: A | Discharge status: Home / Self Care | Payer: Self-pay | Source: Ambulatory Visit | Attending: Internal Medicine

## 2012-08-26 DIAGNOSIS — C187 Malignant neoplasm of sigmoid colon: Secondary | ICD-10-CM

## 2012-08-26 DIAGNOSIS — I10 Essential (primary) hypertension: Secondary | ICD-10-CM | POA: Insufficient documentation

## 2012-08-26 DIAGNOSIS — R195 Other fecal abnormalities: Secondary | ICD-10-CM

## 2012-08-26 DIAGNOSIS — Z538 Procedure and treatment not carried out for other reasons: Secondary | ICD-10-CM | POA: Insufficient documentation

## 2012-08-26 DIAGNOSIS — K5730 Diverticulosis of large intestine without perforation or abscess without bleeding: Secondary | ICD-10-CM

## 2012-08-26 DIAGNOSIS — D649 Anemia, unspecified: Secondary | ICD-10-CM

## 2012-08-26 DIAGNOSIS — E785 Hyperlipidemia, unspecified: Secondary | ICD-10-CM | POA: Insufficient documentation

## 2012-08-26 DIAGNOSIS — K921 Melena: Secondary | ICD-10-CM | POA: Insufficient documentation

## 2012-08-26 HISTORY — PX: FLEXIBLE SIGMOIDOSCOPY: SHX5431

## 2012-08-26 SURGERY — SIGMOIDOSCOPY, FLEXIBLE
Anesthesia: Moderate Sedation

## 2012-08-26 MED ORDER — ONDANSETRON HCL 4 MG/2ML IJ SOLN
4.0000 mg | Freq: Once | INTRAMUSCULAR | Status: AC
  Administered 2012-08-26: 4 mg via INTRAVENOUS

## 2012-08-26 MED ORDER — MIDAZOLAM HCL 5 MG/5ML IJ SOLN
INTRAMUSCULAR | Status: DC
Start: 2012-08-26 — End: 2012-08-26
  Filled 2012-08-26: qty 10

## 2012-08-26 MED ORDER — MEPERIDINE HCL 100 MG/ML IJ SOLN
INTRAMUSCULAR | Status: DC | PRN
  Administered 2012-08-26: 25 mg via INTRAVENOUS
  Administered 2012-08-26: 50 mg via INTRAVENOUS

## 2012-08-26 MED ORDER — BUTAMBEN-TETRACAINE-BENZOCAINE 2-2-14 % EX AERO
INHALATION_SPRAY | CUTANEOUS | Status: DC | PRN
  Administered 2012-08-26: 1 via TOPICAL

## 2012-08-26 MED ORDER — MEPERIDINE HCL 100 MG/ML IJ SOLN
INTRAMUSCULAR | Status: AC
  Filled 2012-08-26: qty 1

## 2012-08-26 MED ORDER — ONDANSETRON HCL 4 MG/2ML IJ SOLN
INTRAMUSCULAR | Status: AC
  Filled 2012-08-26: qty 2

## 2012-08-26 MED ORDER — MIDAZOLAM HCL 5 MG/5ML IJ SOLN
INTRAMUSCULAR | Status: DC | PRN
  Administered 2012-08-26: 1 mg via INTRAVENOUS
  Administered 2012-08-26: 2 mg via INTRAVENOUS
  Administered 2012-08-26: 1 mg via INTRAVENOUS

## 2012-08-26 MED ORDER — STERILE WATER FOR IRRIGATION IR SOLN
Status: DC | PRN
  Administered 2012-08-26: 11:00:00

## 2012-08-26 NOTE — Interval H&P Note (Signed)
History and Physical Interval Note:  08/26/2012 10:53 AM  Carol Rosario  has presented today for surgery, with the diagnosis of ANEMIA  AND HEME POSITIVE STOOL  The various methods of treatment have been discussed with the patient and family. After consideration of risks, benefits and other options for treatment, the patient has consented to  Procedure(s) (LRB) with comments: COLONOSCOPY (N/A) - 12:15 ESOPHAGOGASTRODUODENOSCOPY (EGD) (N/A) as a surgical intervention .  The patient's history has been reviewed, patient examined, no change in status, stable for surgery.  I have reviewed the patient's chart and labs.  Questions were answered to the patient's satisfaction.     Eula Listen

## 2012-08-26 NOTE — Telephone Encounter (Signed)
Referral to Dr. Lovell Sheehan has been faxed

## 2012-08-26 NOTE — H&P (View-Only) (Signed)
Primary Care Physician:  Cassell Smiles., MD Primary Gastroenterologist:  Dr. Darrick Penna   Chief Complaint  Patient presents with  . Rectal Bleeding    HPI:   Carol Rosario presents today at the request of Dr. Phillips Odor secondary to heme + stools. No evidence of rectal bleeding. No black, tarry stool. No abdominal pain. States umbilicus "runs". Notes "discharge" at times, has to wipe with toilet paper. States chronic. Clear fluid. Malodorous. No prior colonoscopy per pt. No reflux, dysphagia. Deals with constipation, takes something like MOM every other day with good results. Used to weigh around 180s, down to 150s over past 3-4 years. Unintentional but trying to maintain.   Husband passed away first of 08/01/23 this year. Had heart trouble. Would have been married 54 years next month. Daughter passed away a year ago from heart trouble as well.   Lab Results  Component Value Date   WBC 5.8 07/30/2012   HGB 11.1* 07/30/2012   HCT 33.8* 07/30/2012   MCV 81.4 07/30/2012   PLT 260 07/30/2012    Past Medical History  Diagnosis Date  . Hypertension   . Hyperlipidemia   . Palpitations   . Family history of tobacco abuse and dependence   . Obesity   . DJD (degenerative joint disease)   . Degeneration of lumbar or lumbosacral intervertebral disc   . Sciatica   . History of recurrent UTIs     Past Surgical History  Procedure Date  . Cataract extraction, bilateral   . Cholecystectomy   . Bilateral tubal ligation   . Vesicovaginal fistula closure w/ tah   . Bladder resuspension   . Hip surgery   . Knee surgery     Current Outpatient Prescriptions  Medication Sig Dispense Refill  . ALPRAZolam (XANAX) 0.5 MG tablet Take 0.25 mg by mouth at bedtime as needed.       Marland Kitchen aspirin 81 MG tablet Take 81 mg by mouth daily.      . fish oil-omega-3 fatty acids 1000 MG capsule Take 2 g by mouth daily.      . hydrocodone-acetaminophen (LORCET-HD) 5-500 MG per capsule Take 1 capsule by mouth every 6 (six)  hours as needed. Take one tablet by mouth as needed for pain at bedtime  60 capsule  5  . levothyroxine (SYNTHROID, LEVOTHROID) 50 MCG tablet Take 50 mcg by mouth daily.      Marland Kitchen lisinopril (PRINIVIL,ZESTRIL) 20 MG tablet Take 1 tablet (20 mg total) by mouth daily.  90 tablet  1  . Omega-3 Fatty Acids (FISH OIL) 1000 MG CAPS Take 1,000 mg by mouth at bedtime.      . simvastatin (ZOCOR) 40 MG tablet Take 1 tablet (40 mg total) by mouth at bedtime.  90 tablet  1   Current Facility-Administered Medications  Medication Dose Route Frequency Provider Last Rate Last Dose  . methylPREDNISolone acetate (DEPO-MEDROL) injection 40 mg  40 mg Intra-articular Once Vickki Hearing, MD      . methylPREDNISolone acetate (DEPO-MEDROL) injection 40 mg  40 mg Intra-articular Once Vickki Hearing, MD        Allergies as of 07/29/2012 - Review Complete 07/29/2012  Allergen Reaction Noted  . Codeine Itching     Family History  Problem Relation Age of Onset  . Colon cancer Neg Hx     History   Social History  . Marital Status: Married    Spouse Name: N/A    Number of Children: N/A  . Years of Education:  N/A   Occupational History  . Not on file.   Social History Main Topics  . Smoking status: Former Smoker    Types: Cigarettes    Quit date: 08/13/2010  . Smokeless tobacco: Not on file  . Alcohol Use: No  . Drug Use: Not on file  . Sexually Active: Not on file   Other Topics Concern  . Not on file   Social History Narrative  . No narrative on file    Review of Systems: Gen: Denies any fever, chills, fatigue, weight loss, lack of appetite.  CV: Denies chest pain, heart palpitations, peripheral edema, syncope.  Resp: Denies shortness of breath at rest or with exertion. Denies wheezing or cough.  GI: Denies dysphagia or odynophagia. Denies jaundice, hematemesis, fecal incontinence. GU : Denies urinary burning, urinary frequency, urinary hesitancy MS: knee pain Derm: Denies rash,  itching, dry skin Psych: Denies depression, anxiety, memory loss, and confusion Heme: Denies bruising, bleeding, and enlarged lymph nodes.  Physical Exam: BP 134/81  Pulse 80  Temp 98.2 F (36.8 C) (Temporal)  Ht 5\' 5"  (1.651 m)  Wt 152 lb 9.6 oz (69.219 kg)  BMI 25.39 kg/m2 General:   Alert and oriented. Pleasant and cooperative. Well-nourished and well-developed.  Head:  Normocephalic and atraumatic. Eyes:  Without icterus, sclera clear and conjunctiva pink.  Ears:  Normal auditory acuity. Nose:  No deformity, discharge,  or lesions. Mouth:  No deformity or lesions, oral mucosa pink.  Neck:  Supple, without mass or thyromegaly. Lungs:  Clear to auscultation bilaterally. No wheezes, rales, or rhonchi. No distress.  Heart:  S1, S2 present without murmurs appreciated.  Abdomen:  +BS, soft, non-tender and non-distended. No HSM noted. No guarding or rebound. No masses appreciated.  Rectal:  Deferred  Msk:  Symmetrical without gross deformities. Normal posture. Extremities:  Without clubbing or edema. Neurologic:  Alert and  oriented x4;  grossly normal neurologically. Skin:  Intact without significant lesions or rashes. Cervical Nodes:  No significant cervical adenopathy. Psych:  Alert and cooperative. Normal mood and affect.

## 2012-08-26 NOTE — Op Note (Signed)
[facility] [address] [place], [address]   COLONOSCOPY PROCEDURE REPORT  PATIENT: Carol Rosario, Carol Rosario  MR#:         [id number] BIRTHDATE: [birth year] , 70  yrs. old GENDER: Female ENDOSCOPIST: R.  Roetta Sessions, MD FACP FACG REFERRED BY:  Artis Delay, M.D. PROCEDURE DATE:  08/26/2012 PROCEDURE:     Attempted / incomplete colonoscopy with biopsy  INDICATIONS: Hemoccult positive stool  INFORMED CONSENT:  The risks, benefits, alternatives and imponderables including but not limited to bleeding, perforation as well as the possibility of a missed lesion have been reviewed.  The potential for biopsy, lesion removal, etc. have also been discussed.  Questions have been answered.  All parties agreeable. Please see the history and physical in the medical record for more information.  MEDICATIONS: Versed 4 mg IV and Demerol 75 mg IV in divided doses.  DESCRIPTION OF PROCEDURE:  After a digital rectal exam was performed, the EC-[id number] ([id number]) and Pentax Colonoscope [id number] colonoscope was advanced from the anus through the rectum to the level of the junction of sigmoid and descending segment..  The mucosal surfaces were carefully surveyed utilizing scope tip deflection to facilitate fold flattening as needed.  The scope was pulled down into the rectum where a thorough examination including retroflexion was performed.    FINDINGS:  Adequate preparation. Normal rectum. Scattered sigmoid diverticula. At 45 cm from the anal verge (approximately junction between sigmoid and the descending segment). An apple core exophytic neoplastic process was found. There was significant encroachment on the lumen. The lumen narrowed down to 2-3 mm. I was unable to traverse this segment of the colon even with the pediatric colonoscope. Subsequently, multiple biopsies of this lesion were taken utilizing the jumbo biopsy forceps.  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  RECOMMENDATIONS: I have discussed my findings and recommendations with patient and multiple family members. They are to let me know which surgeon they would like the patient to see. I've also discussed my findings and recommendations with Dr. Yetta Numbers via telephone today in Dr. Sharyon Medicus absence.   _______________________________ eSigned:  R. Roetta Sessions, MD FACP [facility] 08/26/2012 11:44 AM   CC:

## 2012-08-26 NOTE — Interval H&P Note (Signed)
History and Physical Interval Note:  08/26/2012 10:51 AM  Carol Rosario  has presented today for surgery, with the diagnosis of ANEMIA  AND HEME POSITIVE STOOL  The various methods of treatment have been discussed with the patient and family. After consideration of risks, benefits and other options for treatment, the patient has consented to  Procedure(s) (LRB) with comments: COLONOSCOPY (N/A) - 12:15 ESOPHAGOGASTRODUODENOSCOPY (EGD) (N/A) as a surgical intervention .  The patient's history has been reviewed, patient examined, no change in status, stable for surgery.  I have reviewed the patient's chart and labs.  Questions were answered to the patient's satisfaction.     Eula Listen

## 2012-08-26 NOTE — Interval H&P Note (Signed)
History and Physical Interval Note:  08/26/2012 10:53 AM  Carol Rosario  has presented today for surgery, with the diagnosis of ANEMIA  AND HEME POSITIVE STOOL  The various methods of treatment have been discussed with the patient and family. After consideration of risks, benefits and other options for treatment, the patient has consented to  Procedure(s) (LRB) with comments: COLONOSCOPY (N/A) - 12:15 ESOPHAGOGASTRODUODENOSCOPY (EGD) (N/A) as a surgical intervention .  The patient's history has been reviewed, patient examined, no change in status, stable for surgery.  I have reviewed the patient's chart and labs.  Questions were answered to the patient's satisfaction.     [name]   

## 2012-08-26 NOTE — Progress Notes (Signed)
Patient c/o nausea.  Attempted to vomit but no emesis.  Peppermint oil tried but still nauseated.  Dr. Jena Gauss not. and orders obtained.

## 2012-08-26 NOTE — Telephone Encounter (Signed)
Per RMR pt will need a surgical consult and the family should call back with that preference.  Pt's daughter called and stated they would like to see Dr. Lovell Sheehan.

## 2012-08-26 NOTE — H&P (View-Only) (Signed)
Quick Note:  Ferritin low, 4 Iron low at 30 Hgb 11.1, slightly drifted down from 11.8 in June 2013.  IDA. Continue with plans for TCS/EGD. ? Anticipate capsule study if no findings to explain IDA. ______ 

## 2012-08-27 NOTE — Telephone Encounter (Signed)
Patient is scheduled with Dr. Lovell Sheehan on Thursday 09/02/12 at 10:00 am and patient is aware

## 2012-08-29 ENCOUNTER — Telehealth: Payer: Self-pay | Admitting: Internal Medicine

## 2012-08-29 NOTE — Telephone Encounter (Signed)
I informed pt biopsies positive for adenocarcinoma via phone; she states daughter has made her an appt to see Dr Lovell Sheehan this  Thursday.  Please send cc of path to Dr. Lovell Sheehan and Sherwood Gambler

## 2012-09-02 ENCOUNTER — Other Ambulatory Visit (HOSPITAL_COMMUNITY): Payer: Self-pay | Admitting: General Surgery

## 2012-09-02 DIAGNOSIS — C189 Malignant neoplasm of colon, unspecified: Secondary | ICD-10-CM

## 2012-09-06 ENCOUNTER — Ambulatory Visit (HOSPITAL_COMMUNITY): Payer: Medicare Other

## 2012-09-06 ENCOUNTER — Other Ambulatory Visit (HOSPITAL_COMMUNITY): Payer: Medicare Other

## 2012-09-06 ENCOUNTER — Ambulatory Visit (HOSPITAL_COMMUNITY)
Admission: RE | Admit: 2012-09-06 | Discharge: 2012-09-06 | Disposition: A | Discharge status: Home / Self Care | Payer: Medicare Other | Source: Ambulatory Visit | Attending: General Surgery | Admitting: General Surgery

## 2012-09-06 ENCOUNTER — Encounter (HOSPITAL_COMMUNITY): Payer: Self-pay | Admitting: Internal Medicine

## 2012-09-06 DIAGNOSIS — C189 Malignant neoplasm of colon, unspecified: Secondary | ICD-10-CM | POA: Insufficient documentation

## 2012-09-06 DIAGNOSIS — K7689 Other specified diseases of liver: Secondary | ICD-10-CM | POA: Insufficient documentation

## 2012-09-06 MED ORDER — IOHEXOL 300 MG/ML  SOLN: 100.0000 mL | Freq: Once | INTRAMUSCULAR | Status: AC | PRN

## 2012-09-06 MED ORDER — IOHEXOL 300 MG/ML  SOLN
80.0000 mL | Freq: Once | INTRAMUSCULAR | Status: AC | PRN
  Administered 2012-09-06: 80 mL via INTRAVENOUS

## 2012-09-07 ENCOUNTER — Other Ambulatory Visit (HOSPITAL_COMMUNITY): Payer: Medicare Other

## 2012-09-07 ENCOUNTER — Encounter (HOSPITAL_COMMUNITY): Payer: Self-pay | Admitting: Pharmacy Technician

## 2012-09-07 NOTE — H&P (Signed)
  NTS SOAP Note  Vital Signs:  Vitals as of: 09/02/2012: Systolic 132: Diastolic 78: Heart Rate 101: Temp 98.49F: Height 21ft 4.5in: Weight 152Lbs 0 Ounces: Pain Level 4: BMI 26  BMI : 25.69 kg/m2  Subjective: This 71 Years 22 Months old Female presents for of a colon cancer. Found on TCS by Dr. Jena Gauss.  Was near occluding at rectosigmoid juncture, biopsy positive for cancer.  Rest of colon could not be seen.  States she has had problems with bowel movements for some time now.  Denies blood per rectum.  No family h/o colon cancer.  Review of Symptoms:  Constitutional:unremarkable   Head:unremarkable    Eyes:unremarkable   Nose/Mouth/Throat:unremarkable Cardiovascular:  unremarkable   Respiratory:unremarkable   Genitourinary:unremarkable       joint pain Skin:unremarkable Hematolgic/Lymphatic:unremarkable     Allergic/Immunologic:unremarkable     Past Medical History:    Reviewed   Past Medical History  Surgical History: hysterectomy, cholecystectomy, left hip surgery, BTL Medical Problems:  High Blood pressure, High cholesterol, Hypothyroidism Psychiatric History:  Anxiety Allergies: codeine Medications: lisinopril, levothyroxine, simvastatin, alprazolam   Social History:Reviewed  Social History  Preferred Language: English ([place]) Ethnicity: Not Hispanic / Latino Age: 71 Years 10 Months Alcohol:  No Recreational drug(s):  No   Smoking Status: Current every day smoker reviewed on 09/02/2012 Started Date: 12/15/1958 Packs per day: 1.00   Family History:  Reviewed   Family History  Is there a family history of:No family h/o colon cancer    Objective Information: General:  Well appearing, well nourished in no distress. Head:Atraumatic; no masses; no abnormalities Neck:  Supple without lymphadenopathy, carotid bruits.  Heart:  RRR, no murmur or gallop.  Normal S1, S2.  No S3, S4.  Lungs:    CTA  bilaterally, no wheezes, rhonchi, rales.  Breathing unlabored. Abdomen:Soft, NT/ND, no HSM, no masses.  Assessment:Sigmoid colon cancer    Plan:Will get CT scan of abdomen/pelvis to further assess liver and colon.  Follow up here after test.  Will need laparoscopic handassisted partial colectomy.   Patient Education:Alternative treatments to surgery were discussed with patient (and family).  Risks and benefits  of procedure were fully explained to the patient (and family) who gave informed consent. Patient/family questions were addressed.  Follow-up:Pending Test Results

## 2012-09-09 ENCOUNTER — Encounter (HOSPITAL_COMMUNITY): Payer: Self-pay

## 2012-09-09 ENCOUNTER — Encounter (HOSPITAL_COMMUNITY)
Admission: RE | Admit: 2012-09-09 | Discharge: 2012-09-09 | Disposition: A | Discharge status: Home / Self Care | Payer: Medicare Other | Source: Ambulatory Visit | Attending: General Surgery | Admitting: General Surgery

## 2012-09-09 ENCOUNTER — Other Ambulatory Visit: Payer: Self-pay

## 2012-09-09 ENCOUNTER — Ambulatory Visit: Payer: Medicare Other | Admitting: Orthopedic Surgery

## 2012-09-09 ENCOUNTER — Ambulatory Visit (HOSPITAL_COMMUNITY)
Admission: RE | Admit: 2012-09-09 | Discharge: 2012-09-09 | Disposition: A | Discharge status: Home / Self Care | Payer: Medicare Other | Source: Ambulatory Visit | Attending: General Surgery | Admitting: General Surgery

## 2012-09-09 LAB — CBC WITH DIFFERENTIAL/PLATELET
Basophils Absolute: 0 10*3/uL (ref 0.0–0.1)
Basophils Relative: 1 % (ref 0–1)
Eosinophils Absolute: 0.1 10*3/uL (ref 0.0–0.7)
Eosinophils Relative: 1 % (ref 0–5)
HCT: 35 % — ABNORMAL LOW (ref 36.0–46.0)
Hemoglobin: 10.6 g/dL — ABNORMAL LOW (ref 12.0–15.0)
Lymphocytes Relative: 23 % (ref 12–46)
Lymphs Abs: 1.6 10*3/uL (ref 0.7–4.0)
MCH: 24.5 pg — ABNORMAL LOW (ref 26.0–34.0)
MCHC: 30.3 g/dL (ref 30.0–36.0)
MCV: 80.8 fL (ref 78.0–100.0)
Monocytes Absolute: 0.4 10*3/uL (ref 0.1–1.0)
Monocytes Relative: 5 % (ref 3–12)
Neutro Abs: 5.1 10*3/uL (ref 1.7–7.7)
Neutrophils Relative %: 71 % (ref 43–77)
Platelets: 252 10*3/uL (ref 150–400)
RBC: 4.33 MIL/uL (ref 3.87–5.11)
RDW: 14.5 % (ref 11.5–15.5)
WBC: 7.2 10*3/uL (ref 4.0–10.5)

## 2012-09-09 LAB — COMPREHENSIVE METABOLIC PANEL
ALT: 10 U/L (ref 0–35)
AST: 16 U/L (ref 0–37)
Albumin: 3.9 g/dL (ref 3.5–5.2)
Alkaline Phosphatase: 69 U/L (ref 39–117)
BUN: 27 mg/dL — ABNORMAL HIGH (ref 6–23)
CO2: 31 mEq/L (ref 19–32)
Calcium: 10.1 mg/dL (ref 8.4–10.5)
Chloride: 99 mEq/L (ref 96–112)
Creatinine, Ser: 1.15 mg/dL — ABNORMAL HIGH (ref 0.50–1.10)
GFR calc Af Amer: 55 mL/min — ABNORMAL LOW (ref >90)
GFR calc non Af Amer: 47 mL/min — ABNORMAL LOW (ref >90)
Glucose, Bld: 91 mg/dL (ref 70–99)
Potassium: 4.5 mEq/L (ref 3.5–5.1)
Sodium: 136 mEq/L (ref 135–145)
Total Bilirubin: 0.3 mg/dL (ref 0.3–1.2)
Total Protein: 6.8 g/dL (ref 6.0–8.3)

## 2012-09-09 LAB — SURGICAL PCR SCREEN
MRSA, PCR: NEGATIVE
Staphylococcus aureus: NEGATIVE

## 2012-09-09 LAB — PREPARE RBC (CROSSMATCH)

## 2012-09-09 NOTE — Patient Instructions (Addendum)
   20 Carol Rosario  09/09/2012   Your procedure is scheduled on:  09/13/2012  Report to [facility] at  800  AM.  Call this number if you have problems the morning of surgery: [phone]   Remember:   Do not eat food:After Midnight.  May have clear liquids:until Midnight .    Take these medicines the morning of surgery with A SIP OF WATER: lorcet,lisinopril,xananx,synthroid   Do not wear jewelry, make-up or nail polish.  Do not wear lotions, powders, or perfumes. You may wear deodorant.  Do not shave 48 hours prior to surgery. Men may shave face and neck.  Do not bring valuables to the hospital.  Contacts, dentures or bridgework may not be worn into surgery.  Leave suitcase in the car. After surgery it may be brought to your room.  For patients admitted to the hospital, checkout time is 11:00 AM the day of discharge.   Patients discharged the day of surgery will not be allowed to drive home.  Name and phone number of your driver: family  Special Instructions: Shower using CHG 2 nights before surgery and the night before surgery.  If you shower the day of surgery use CHG.  Use special wash - you have one bottle of CHG for all showers.  You should use approximately 1/3 of the bottle for each shower.   Please read over the following fact sheets that you were given: Pain Booklet, MRSA Information, Surgical Site Infection Prevention, Anesthesia Post-op Instructions and Care and Recovery After Surgery PATIENT INSTRUCTIONS POST-ANESTHESIA  IMMEDIATELY FOLLOWING SURGERY:  Do not drive or operate machinery for the first twenty four hours after surgery.  Do not make any important decisions for twenty four hours after surgery or while taking narcotic pain medications or sedatives.  If you develop intractable nausea and vomiting or a severe headache please notify your doctor immediately.  FOLLOW-UP:  Please make an appointment with your surgeon as instructed. You do not need to follow up with  anesthesia unless specifically instructed to do so.  WOUND CARE INSTRUCTIONS (if applicable):  Keep a dry clean dressing on the anesthesia/puncture wound site if there is drainage.  Once the wound has quit draining you may leave it open to air.  Generally you should leave the bandage intact for twenty four hours unless there is drainage.  If the epidural site drains for more than 36-48 hours please call the anesthesia department.  QUESTIONS?:  Please feel free to call your physician or the hospital operator if you have any questions, and they will be happy to assist you.

## 2012-09-10 LAB — CEA: CEA: 0.6 ng/mL (ref 0.0–5.0)

## 2012-09-10 NOTE — Progress Notes (Signed)
Dr. Jayme Cloud notified of abnormal labwork (h&h, creatinine and BUN). No new orders received.

## 2012-09-13 ENCOUNTER — Encounter (HOSPITAL_COMMUNITY)
Admission: RE | Disposition: A | Discharge status: Home / Self Care | Payer: Self-pay | Source: Ambulatory Visit | Attending: General Surgery

## 2012-09-13 ENCOUNTER — Encounter (HOSPITAL_COMMUNITY): Payer: Self-pay | Admitting: *Deleted

## 2012-09-13 ENCOUNTER — Inpatient Hospital Stay (HOSPITAL_COMMUNITY)
Admission: RE | Admit: 2012-09-13 | Discharge: 2012-09-16 | DRG: 329 | Disposition: A | Discharge status: Home / Self Care | Payer: Medicare Other | Source: Ambulatory Visit | Attending: General Surgery | Admitting: General Surgery

## 2012-09-13 ENCOUNTER — Encounter (HOSPITAL_COMMUNITY): Payer: Self-pay | Admitting: Anesthesiology

## 2012-09-13 ENCOUNTER — Inpatient Hospital Stay (HOSPITAL_COMMUNITY): Payer: Medicare Other | Admitting: Anesthesiology

## 2012-09-13 DIAGNOSIS — R002 Palpitations: Secondary | ICD-10-CM | POA: Diagnosis present

## 2012-09-13 DIAGNOSIS — M1710 Unilateral primary osteoarthritis, unspecified knee: Secondary | ICD-10-CM

## 2012-09-13 DIAGNOSIS — E039 Hypothyroidism, unspecified: Secondary | ICD-10-CM | POA: Diagnosis present

## 2012-09-13 DIAGNOSIS — M129 Arthropathy, unspecified: Secondary | ICD-10-CM | POA: Diagnosis present

## 2012-09-13 DIAGNOSIS — F411 Generalized anxiety disorder: Secondary | ICD-10-CM | POA: Diagnosis present

## 2012-09-13 DIAGNOSIS — Z79899 Other long term (current) drug therapy: Secondary | ICD-10-CM

## 2012-09-13 DIAGNOSIS — Z90710 Acquired absence of both cervix and uterus: Secondary | ICD-10-CM

## 2012-09-13 DIAGNOSIS — E7800 Pure hypercholesterolemia, unspecified: Secondary | ICD-10-CM | POA: Diagnosis present

## 2012-09-13 DIAGNOSIS — F17200 Nicotine dependence, unspecified, uncomplicated: Secondary | ICD-10-CM | POA: Diagnosis present

## 2012-09-13 DIAGNOSIS — Z9089 Acquired absence of other organs: Secondary | ICD-10-CM

## 2012-09-13 DIAGNOSIS — C187 Malignant neoplasm of sigmoid colon: Principal | ICD-10-CM | POA: Diagnosis present

## 2012-09-13 DIAGNOSIS — M1610 Unilateral primary osteoarthritis, unspecified hip: Secondary | ICD-10-CM

## 2012-09-13 DIAGNOSIS — K631 Perforation of intestine (nontraumatic): Secondary | ICD-10-CM | POA: Diagnosis present

## 2012-09-13 DIAGNOSIS — D62 Acute posthemorrhagic anemia: Secondary | ICD-10-CM | POA: Diagnosis not present

## 2012-09-13 DIAGNOSIS — I10 Essential (primary) hypertension: Secondary | ICD-10-CM | POA: Diagnosis present

## 2012-09-13 DIAGNOSIS — Z885 Allergy status to narcotic agent status: Secondary | ICD-10-CM

## 2012-09-13 HISTORY — PX: PARTIAL COLECTOMY: SHX5273

## 2012-09-13 SURGERY — COLECTOMY, PARTIAL
Anesthesia: General | Site: Abdomen | Wound class: Clean Contaminated

## 2012-09-13 MED ORDER — ONDANSETRON HCL 4 MG/2ML IJ SOLN
INTRAMUSCULAR | Status: AC
  Filled 2012-09-13: qty 2

## 2012-09-13 MED ORDER — ENOXAPARIN SODIUM 40 MG/0.4ML ~~LOC~~ SOLN
40.0000 mg | SUBCUTANEOUS | Status: DC
  Administered 2012-09-14 – 2012-09-16 (×3): 40 mg via SUBCUTANEOUS
  Filled 2012-09-13 (×3): qty 0.4

## 2012-09-13 MED ORDER — LISINOPRIL 10 MG PO TABS
20.0000 mg | ORAL_TABLET | Freq: Every day | ORAL | Status: DC
  Administered 2012-09-14 – 2012-09-16 (×3): 20 mg via ORAL
  Filled 2012-09-13 (×4): qty 2

## 2012-09-13 MED ORDER — ACETAMINOPHEN 10 MG/ML IV SOLN
INTRAVENOUS | Status: AC
  Filled 2012-09-13: qty 100

## 2012-09-13 MED ORDER — GLYCOPYRROLATE 0.2 MG/ML IJ SOLN
0.2000 mg | Freq: Once | INTRAMUSCULAR | Status: AC
  Administered 2012-09-13: 0.2 mg via INTRAVENOUS

## 2012-09-13 MED ORDER — ONDANSETRON HCL 4 MG PO TABS: 4.0000 mg | ORAL_TABLET | Freq: Four times a day (QID) | ORAL | Status: DC | PRN

## 2012-09-13 MED ORDER — NEOSTIGMINE METHYLSULFATE 1 MG/ML IJ SOLN
INTRAMUSCULAR | Status: AC
  Filled 2012-09-13: qty 10

## 2012-09-13 MED ORDER — ALPRAZOLAM 0.5 MG PO TABS
0.5000 mg | ORAL_TABLET | Freq: Every evening | ORAL | Status: DC | PRN
  Administered 2012-09-14: 0.5 mg via ORAL
  Filled 2012-09-13: qty 1

## 2012-09-13 MED ORDER — LACTATED RINGERS IV SOLN
INTRAVENOUS | Status: DC
  Administered 2012-09-13: 100 mL via INTRAVENOUS
  Administered 2012-09-13 – 2012-09-14 (×2): via INTRAVENOUS

## 2012-09-13 MED ORDER — LEVOTHYROXINE SODIUM 50 MCG PO TABS
50.0000 ug | ORAL_TABLET | Freq: Every day | ORAL | Status: DC
  Administered 2012-09-14 – 2012-09-16 (×3): 50 ug via ORAL
  Filled 2012-09-13 (×3): qty 1

## 2012-09-13 MED ORDER — ONDANSETRON HCL 4 MG/2ML IJ SOLN
4.0000 mg | Freq: Once | INTRAMUSCULAR | Status: AC
  Administered 2012-09-13: 4 mg via INTRAVENOUS

## 2012-09-13 MED ORDER — FENTANYL CITRATE 0.05 MG/ML IJ SOLN
INTRAMUSCULAR | Status: AC
  Filled 2012-09-13: qty 2

## 2012-09-13 MED ORDER — PROPOFOL 10 MG/ML IV BOLUS
INTRAVENOUS | Status: DC | PRN
  Administered 2012-09-13: 130 mg via INTRAVENOUS

## 2012-09-13 MED ORDER — SODIUM CHLORIDE 0.9 % IV SOLN
INTRAVENOUS | Status: AC
  Filled 2012-09-13: qty 1

## 2012-09-13 MED ORDER — GLYCOPYRROLATE 0.2 MG/ML IJ SOLN
INTRAMUSCULAR | Status: AC
  Filled 2012-09-13: qty 1

## 2012-09-13 MED ORDER — ENOXAPARIN SODIUM 40 MG/0.4ML ~~LOC~~ SOLN
40.0000 mg | Freq: Once | SUBCUTANEOUS | Status: AC
  Administered 2012-09-13: 40 mg via SUBCUTANEOUS

## 2012-09-13 MED ORDER — GLYCOPYRROLATE 0.2 MG/ML IJ SOLN
INTRAMUSCULAR | Status: AC
  Filled 2012-09-13: qty 2

## 2012-09-13 MED ORDER — POVIDONE-IODINE 10 % EX OINT
TOPICAL_OINTMENT | CUTANEOUS | Status: AC
  Filled 2012-09-13: qty 2

## 2012-09-13 MED ORDER — ROCURONIUM BROMIDE 100 MG/10ML IV SOLN
INTRAVENOUS | Status: DC | PRN
  Administered 2012-09-13: 50 mg via INTRAVENOUS

## 2012-09-13 MED ORDER — ATROPINE SULFATE 1 MG/ML IJ SOLN
INTRAMUSCULAR | Status: AC
  Filled 2012-09-13: qty 1

## 2012-09-13 MED ORDER — LIDOCAINE HCL (CARDIAC) 10 MG/ML IV SOLN
INTRAVENOUS | Status: DC | PRN
  Administered 2012-09-13: 10 mg via INTRAVENOUS

## 2012-09-13 MED ORDER — ATROPINE SULFATE 1 MG/ML IJ SOLN
0.4000 mg | Freq: Once | INTRAMUSCULAR | Status: AC
  Administered 2012-09-13: 0.4 mg via INTRAVENOUS

## 2012-09-13 MED ORDER — ATROPINE SULFATE 0.4 MG/ML IJ SOLN: 0.4000 mg | Freq: Once | INTRAMUSCULAR | Status: DC

## 2012-09-13 MED ORDER — SODIUM CHLORIDE 0.9 % IR SOLN
Status: DC | PRN
  Administered 2012-09-13: 2000 mL

## 2012-09-13 MED ORDER — FENTANYL CITRATE 0.05 MG/ML IJ SOLN
25.0000 ug | INTRAMUSCULAR | Status: DC | PRN
  Administered 2012-09-13 (×2): 50 ug via INTRAVENOUS

## 2012-09-13 MED ORDER — SODIUM CHLORIDE 0.9 % IV SOLN
1.0000 g | INTRAVENOUS | Status: AC
  Administered 2012-09-13: 1 g via INTRAVENOUS

## 2012-09-13 MED ORDER — NICOTINE 21 MG/24HR TD PT24
21.0000 mg | MEDICATED_PATCH | Freq: Every day | TRANSDERMAL | Status: DC
  Administered 2012-09-13: 21 mg via TRANSDERMAL
  Filled 2012-09-13 (×2): qty 1

## 2012-09-13 MED ORDER — ALVIMOPAN 12 MG PO CAPS
ORAL_CAPSULE | ORAL | Status: AC
  Filled 2012-09-13: qty 1

## 2012-09-13 MED ORDER — POVIDONE-IODINE 10 % OINT PACKET
TOPICAL_OINTMENT | CUTANEOUS | Status: DC | PRN
  Administered 2012-09-13: 2 via TOPICAL

## 2012-09-13 MED ORDER — CHLORHEXIDINE GLUCONATE 4 % EX LIQD: 1.0000 "application " | Freq: Once | CUTANEOUS | Status: DC

## 2012-09-13 MED ORDER — GLYCOPYRROLATE 0.2 MG/ML IJ SOLN
INTRAMUSCULAR | Status: DC | PRN
  Administered 2012-09-13: 0.2 mg via INTRAVENOUS
  Administered 2012-09-13: 0.4 mg via INTRAVENOUS

## 2012-09-13 MED ORDER — LIDOCAINE HCL (PF) 1 % IJ SOLN
INTRAMUSCULAR | Status: AC
  Filled 2012-09-13: qty 5

## 2012-09-13 MED ORDER — ACETAMINOPHEN 10 MG/ML IV SOLN
1000.0000 mg | Freq: Four times a day (QID) | INTRAVENOUS | Status: AC
  Administered 2012-09-13 – 2012-09-14 (×4): 1000 mg via INTRAVENOUS
  Filled 2012-09-13 (×3): qty 100

## 2012-09-13 MED ORDER — ENOXAPARIN SODIUM 40 MG/0.4ML ~~LOC~~ SOLN
SUBCUTANEOUS | Status: AC
  Filled 2012-09-13: qty 0.4

## 2012-09-13 MED ORDER — ALUM & MAG HYDROXIDE-SIMETH 200-200-20 MG/5ML PO SUSP: 30.0000 mL | Freq: Four times a day (QID) | ORAL | Status: DC | PRN

## 2012-09-13 MED ORDER — ALVIMOPAN 12 MG PO CAPS
12.0000 mg | ORAL_CAPSULE | Freq: Once | ORAL | Status: AC
  Administered 2012-09-13: 12 mg via ORAL

## 2012-09-13 MED ORDER — SUFENTANIL CITRATE 50 MCG/ML IV SOLN
INTRAVENOUS | Status: AC
  Filled 2012-09-13: qty 1

## 2012-09-13 MED ORDER — PROPOFOL 10 MG/ML IV EMUL
INTRAVENOUS | Status: AC
  Filled 2012-09-13: qty 20

## 2012-09-13 MED ORDER — ROCURONIUM BROMIDE 50 MG/5ML IV SOLN
INTRAVENOUS | Status: AC
  Filled 2012-09-13: qty 1

## 2012-09-13 MED ORDER — LACTATED RINGERS IV SOLN
INTRAVENOUS | Status: DC
  Administered 2012-09-13 (×2): via INTRAVENOUS

## 2012-09-13 MED ORDER — MIDAZOLAM HCL 2 MG/2ML IJ SOLN
1.0000 mg | INTRAMUSCULAR | Status: DC | PRN
  Administered 2012-09-13: 2 mg via INTRAVENOUS

## 2012-09-13 MED ORDER — MIDAZOLAM HCL 2 MG/2ML IJ SOLN
INTRAMUSCULAR | Status: AC
  Filled 2012-09-13: qty 2

## 2012-09-13 MED ORDER — MORPHINE SULFATE 2 MG/ML IJ SOLN
2.0000 mg | INTRAMUSCULAR | Status: DC | PRN
  Administered 2012-09-13 – 2012-09-14 (×3): 2 mg via INTRAVENOUS
  Filled 2012-09-13 (×3): qty 1

## 2012-09-13 MED ORDER — ONDANSETRON HCL 4 MG/2ML IJ SOLN: 4.0000 mg | Freq: Four times a day (QID) | INTRAMUSCULAR | Status: DC | PRN

## 2012-09-13 MED ORDER — SUFENTANIL CITRATE 50 MCG/ML IV SOLN
INTRAVENOUS | Status: DC | PRN
  Administered 2012-09-13: 5 ug via INTRAVENOUS
  Administered 2012-09-13 (×2): 10 ug via INTRAVENOUS
  Administered 2012-09-13: 5 ug via INTRAVENOUS

## 2012-09-13 MED ORDER — NEOSTIGMINE METHYLSULFATE 1 MG/ML IJ SOLN
INTRAMUSCULAR | Status: DC | PRN
  Administered 2012-09-13: 2 mg via INTRAVENOUS

## 2012-09-13 MED ORDER — ALVIMOPAN 12 MG PO CAPS
12.0000 mg | ORAL_CAPSULE | Freq: Two times a day (BID) | ORAL | Status: DC
  Administered 2012-09-14 – 2012-09-15 (×4): 12 mg via ORAL
  Filled 2012-09-13 (×4): qty 1

## 2012-09-13 MED ORDER — ONDANSETRON HCL 4 MG/2ML IJ SOLN: 4.0000 mg | Freq: Once | INTRAMUSCULAR | Status: DC | PRN

## 2012-09-13 SURGICAL SUPPLY — 74 items
BAG HAMPER (MISCELLANEOUS) ×3 IMPLANT
BLADE SURG SZ10 CARB STEEL (BLADE) ×3 IMPLANT
CLOTH BEACON ORANGE TIMEOUT ST (SAFETY) ×3 IMPLANT
COVER LIGHT HANDLE STERIS (MISCELLANEOUS) ×6 IMPLANT
DECANTER SPIKE VIAL GLASS SM (MISCELLANEOUS) IMPLANT
DRAPE INCISE IOBAN 44X35 STRL (DRAPES) ×3 IMPLANT
DRAPE WARM FLUID 44X44 (DRAPE) ×3 IMPLANT
DURAPREP 26ML APPLICATOR (WOUND CARE) ×3 IMPLANT
ELECT BLADE 6 FLAT ULTRCLN (ELECTRODE) IMPLANT
ELECT REM PT RETURN 9FT ADLT (ELECTROSURGICAL) ×3
ELECTRODE REM PT RTRN 9FT ADLT (ELECTROSURGICAL) ×2 IMPLANT
FILTER SMOKE EVAC LAPAROSHD (FILTER) ×3 IMPLANT
GLOVE BIO SURGEON STRL SZ7.5 (GLOVE) ×5 IMPLANT
GLOVE BIOGEL PI IND STRL 7.0 (GLOVE) ×1 IMPLANT
GLOVE BIOGEL PI IND STRL 7.5 (GLOVE) ×1 IMPLANT
GLOVE BIOGEL PI INDICATOR 7.0 (GLOVE) ×1
GLOVE BIOGEL PI INDICATOR 7.5 (GLOVE) ×1
GLOVE ECLIPSE 7.0 STRL STRAW (GLOVE) ×4 IMPLANT
GLOVE EXAM NITRILE MD LF STRL (GLOVE) ×2 IMPLANT
GLOVE SS BIOGEL STRL SZ 6.5 (GLOVE) ×4 IMPLANT
GLOVE SUPERSENSE BIOGEL SZ 6.5 (GLOVE) ×4
GOWN STRL REIN XL XLG (GOWN DISPOSABLE) ×11 IMPLANT
INST SET LAPROSCOPIC AP (KITS) ×3 IMPLANT
INST SET MAJOR GENERAL (KITS) ×3 IMPLANT
IV NS IRRIG 3000ML ARTHROMATIC (IV SOLUTION) IMPLANT
KIT ROOM TURNOVER AP CYSTO (KITS) IMPLANT
LIGASURE 5MM LAPAROSCOPIC (INSTRUMENTS) IMPLANT
LIGASURE LAP ATLAS 10MM 37CM (INSTRUMENTS) IMPLANT
MANIFOLD NEPTUNE II (INSTRUMENTS) ×3 IMPLANT
NDL HYPO 18GX1.5 BLUNT FILL (NEEDLE) IMPLANT
NEEDLE HYPO 18GX1.5 BLUNT FILL (NEEDLE) IMPLANT
NS IRRIG 1000ML POUR BTL (IV SOLUTION) ×5 IMPLANT
PACK LAP CHOLE LZT030E (CUSTOM PROCEDURE TRAY) ×3 IMPLANT
PAD ARMBOARD 7.5X6 YLW CONV (MISCELLANEOUS) ×3 IMPLANT
PENCIL HANDSWITCHING (ELECTRODE) ×5 IMPLANT
RELOAD LINEAR CUT PROX 55 BLUE (ENDOMECHANICALS) IMPLANT
RELOAD PROXIMATE 75MM BLUE (ENDOMECHANICALS) ×9 IMPLANT
RELOAD STAPLE 45 3.5 BLU ETS (ENDOMECHANICALS) IMPLANT
RELOAD STAPLE 55 3.8 BLU REG (ENDOMECHANICALS) IMPLANT
RELOAD STAPLE 75 3.8 BLU REG (ENDOMECHANICALS) IMPLANT
RELOAD STAPLE TA45 3.5 REG BLU (ENDOMECHANICALS) IMPLANT
SEALER TISSUE G2 CVD JAW 35 (ENDOMECHANICALS) IMPLANT
SEALER TISSUE G2 CVD JAW 45CM (ENDOMECHANICALS)
SET BASIN LINEN APH (SET/KITS/TRAYS/PACK) ×3 IMPLANT
SET TUBE IRRIG SUCTION NO TIP (IRRIGATION / IRRIGATOR) IMPLANT
SHEET LAVH (DRAPES) ×2 IMPLANT
SPONGE GAUZE 2X2 8PLY STRL LF (GAUZE/BANDAGES/DRESSINGS) ×2 IMPLANT
SPONGE GAUZE 4X4 12PLY (GAUZE/BANDAGES/DRESSINGS) ×3 IMPLANT
SPONGE LAP 18X18 X RAY DECT (DISPOSABLE) ×8 IMPLANT
STAPLER GUN LINEAR PROX 60 (STAPLE) ×3 IMPLANT
STAPLER PROXIMATE 55 BLUE (STAPLE) IMPLANT
STAPLER PROXIMATE 75MM BLUE (STAPLE) ×2 IMPLANT
STAPLER VISISTAT (STAPLE) ×3 IMPLANT
SUCTION POOLE TIP (SUCTIONS) ×3 IMPLANT
SUT CHROMIC 0 CT 1 (SUTURE) ×3 IMPLANT
SUT CHROMIC 2 0 SH (SUTURE) IMPLANT
SUT PDS AB 0 CTX 60 (SUTURE) ×2 IMPLANT
SUT PDS AB CT VIOLET #0 27IN (SUTURE) IMPLANT
SUT SILK 2 0 (SUTURE)
SUT SILK 2-0 18XBRD TIE 12 (SUTURE) IMPLANT
SUT SILK 3 0 SH CR/8 (SUTURE) ×3 IMPLANT
SUT VIC AB 0 CT1 27 (SUTURE)
SUT VIC AB 0 CT1 27XCR 8 STRN (SUTURE) IMPLANT
SUT VIC AB 2-0 CT2 27 (SUTURE) IMPLANT
SUT VICRYL 0 UR6 27IN ABS (SUTURE) ×3 IMPLANT
SYS LAPSCP GELPORT 120MM (MISCELLANEOUS) ×3
SYSTEM LAPSCP GELPORT 120MM (MISCELLANEOUS) ×2 IMPLANT
TAPE CLOTH SURG 4X10 WHT LF (GAUZE/BANDAGES/DRESSINGS) ×2 IMPLANT
TRAY FOLEY CATH 14FR (SET/KITS/TRAYS/PACK) ×3 IMPLANT
TROCAR Z-THRD FIOS HNDL 11X100 (TROCAR) ×3 IMPLANT
TROCAR Z-THREAD SLEEVE 11X100 (TROCAR) ×3 IMPLANT
TUBING HI FLO HEAT INSUFFLATOR (IRRIGATION / IRRIGATOR) ×3 IMPLANT
WARMER LAPAROSCOPE (MISCELLANEOUS) ×3 IMPLANT
YANKAUER SUCT BULB TIP 10FT TU (MISCELLANEOUS) ×3 IMPLANT

## 2012-09-13 NOTE — Op Note (Signed)
Patient:  Carol Rosario  DOB:  1941/06/29  MRN:  [id number]   Preop Diagnosis:  Colon carcinoma  Postop Diagnosis:   same  Procedure:  Partial colectomy  Surgeon:  Franky Macho, M.D.  Anes:  General endotracheal  Indications:  Patient is a 71 year old white female who was found on colonoscopy to have a colon stricture approximately 45 cm from the anus. This was biopsied positive for adenocarcinoma. She underwent CT scan the abdomen preoperatively which revealed a hemangioma in the right lobe liver and no other mass lesions. The patient comes the operating room for laparoscopic hand-assisted partial colectomy. The risks and benefits of the procedure including bleeding, infection, cardiopulmonary difficulties, and a possibly of an open procedure were fully explained to the patient, gave informed consent.  Procedure note:  Patient is placed the supine position. After induction of general endotracheal anesthesia, the patient was placed in the lithotomy position. The abdomen and perineum were prepped and draped using usual sterile technique with DuraPrep. Surgical site confirmation was performed.  An 8 cm midline incision was made from just below the umbilicus along the midline inferiorly. The peritoneal cavity was entered into without difficulty. In the mid sigmoid colon, the colonic mass was easily identified. I was able to bring it up through the incision without a significant dissection. It was elected to proceed with an open partial colectomy as the advantages of doing this laparoscopically were nil. The sigmoid colon was mobilized along its peritoneal reflection. Care was taken to avoid the left ureter the splenic flexure was taken down using the LigaSure. The descending colon was also taken down unit along its peritoneal reflection. The liver was inspected and noted within normal limits. The proximal colon was inspected and no mass lesions were noted. A GIA stapler was placed across the distal  sigmoid colon as well as the proximal sigmoid colon and fired. A single suture was placed proximally for orientation purposes. The sigmoid colon mesentery was divided using the LigaSure. The specimen was sent to pathology further examination. While I was performing a side to side anastomosis with the GIA 75 stapler, a small perforation was noted on the proximal end of the colon. An additional segment of the proximal limb was resected and a side-to-side anastomosis was performed. The colotomy was closed using a TA 60 stapler. The staple line was bolstered using 3-0 silk sutures. Surrounding adipose tissue was also placed around the anastomosis. The abdominal cavity was then copious irrigated normal saline after all operating room personnel changed gloves. The anastomosis was inspected and noted to be widely patent. No abnormal bleeding was noted the end of the procedure. The bowel was returned into the abdominal cavity an orderly fashion. The fascia was reapproximated using an looped 0 PDS running suture. Subcutaneous layer was irrigated normal saline and the skin was closed using staples. Betadine ointment after dressings were applied.  All tape and needle counts were correct at the end of the procedure. The patient was extubated in the operating room and went back to recovery room awake in stable condition.  Complications:  None  EBL:  25 cc  Specimen:  Sigmoid colon, suture proximal  Additional proximal sigmoid colon

## 2012-09-13 NOTE — Anesthesia Postprocedure Evaluation (Signed)
Anesthesia Post Note  Patient: Carol Rosario  Procedure(s) Performed: Procedure(s) (LRB): PARTIAL COLECTOMY (N/A)  Anesthesia type: General  Patient location: PACU  Post pain: Pain level controlled  Post assessment: Post-op Vital signs reviewed, Patient's Cardiovascular Status Stable, Respiratory Function Stable, Patent Airway, No signs of Nausea or vomiting and Pain level controlled  Last Vitals:  Filed Vitals:   09/13/12 1055  BP: 130/59  Pulse: 63  Temp: 36.5 C  Resp: 16    Post vital signs: Reviewed and stable  Level of consciousness: awake and alert   Complications: No apparent anesthesia complications

## 2012-09-13 NOTE — Anesthesia Procedure Notes (Signed)
Procedure Name: Intubation Date/Time: 09/13/2012 9:13 AM Performed by: Franco Nones Pre-anesthesia Checklist: Patient identified, Patient being monitored, Timeout performed, Emergency Drugs available and Suction available Patient Re-evaluated:Patient Re-evaluated prior to inductionOxygen Delivery Method: Circle System Utilized Preoxygenation: Pre-oxygenation with 100% oxygen Intubation Type: IV induction Ventilation: Mask ventilation without difficulty Laryngoscope Size: Miller and 2 Grade View: Grade I Tube type: Oral Tube size: 7.0 mm Number of attempts: 1 Airway Equipment and Method: stylet Placement Confirmation: ETT inserted through vocal cords under direct vision,  positive ETCO2 and breath sounds checked- equal and bilateral Secured at: 21 cm Tube secured with: Tape Dental Injury: Teeth and Oropharynx as per pre-operative assessment

## 2012-09-13 NOTE — Anesthesia Preprocedure Evaluation (Signed)
Anesthesia Evaluation  Patient identified by MRN, date of birth, ID band Patient awake    Reviewed: Allergy & Precautions, H&P , NPO status , Patient's Chart, lab work & pertinent test results  History of Anesthesia Complications Negative for: history of anesthetic complications  Airway Mallampati: II  Neck ROM: Full    Dental  (+) Edentulous Upper, Partial Lower, Poor Dentition and Dental Advisory Given   Pulmonary former smoker (am cough),    Pulmonary exam normal       Cardiovascular hypertension, Pt. on medications + dysrhythmias (palpitations) Rhythm:Regular     Neuro/Psych  Neuromuscular disease    GI/Hepatic   Endo/Other    Renal/GU      Musculoskeletal  (+) Arthritis -,   Abdominal   Peds  Hematology   Anesthesia Other Findings   Reproductive/Obstetrics                           Anesthesia Physical Anesthesia Plan  ASA: II  Anesthesia Plan: General   Post-op Pain Management:    Induction: Intravenous  Airway Management Planned: Oral ETT  Additional Equipment:   Intra-op Plan:   Post-operative Plan: Extubation in OR  Informed Consent: I have reviewed the patients History and Physical, chart, labs and discussed the procedure including the risks, benefits and alternatives for the proposed anesthesia with the patient or authorized representative who has indicated his/her understanding and acceptance.     Plan Discussed with:   Anesthesia Plan Comments:         Anesthesia Quick Evaluation

## 2012-09-13 NOTE — Transfer of Care (Signed)
Immediate Anesthesia Transfer of Care Note  Patient: Carol Rosario  Procedure(s) Performed: Procedure(s) (LRB): PARTIAL COLECTOMY (N/A)  Patient Location: PACU  Anesthesia Type: General  Level of Consciousness: awake  Airway & Oxygen Therapy: Patient Spontanous Breathing and non-rebreather face mask  Post-op Assessment: Report given to PACU RN, Post -op Vital signs reviewed and stable and Patient moving all extremities  Post vital signs: Reviewed and stable  Complications: No apparent anesthesia complications

## 2012-09-13 NOTE — Interval H&P Note (Signed)
History and Physical Interval Note:  09/13/2012 8:17 AM  Carol Rosario  has presented today for surgery, with the diagnosis of colon cancer  The various methods of treatment have been discussed with the patient and family. After consideration of risks, benefits and other options for treatment, the patient has consented to  Procedure(s) (LRB) with comments: HAND ASSISTED LAPAROSCOPIC COLON RESECTION (N/A) as a surgical intervention .  The patient's history has been reviewed, patient examined, no change in status, stable for surgery.  I have reviewed the patient's chart and labs.  Questions were answered to the patient's satisfaction.     Franky Macho A

## 2012-09-14 LAB — CBC
HCT: 28.3 % — ABNORMAL LOW (ref 36.0–46.0)
Hemoglobin: 8.4 g/dL — ABNORMAL LOW (ref 12.0–15.0)
MCH: 23.8 pg — ABNORMAL LOW (ref 26.0–34.0)
MCHC: 29.7 g/dL — ABNORMAL LOW (ref 30.0–36.0)
MCV: 80.2 fL (ref 78.0–100.0)
Platelets: 186 10*3/uL (ref 150–400)
RBC: 3.53 MIL/uL — ABNORMAL LOW (ref 3.87–5.11)
RDW: 14.4 % (ref 11.5–15.5)
WBC: 9.7 10*3/uL (ref 4.0–10.5)

## 2012-09-14 LAB — BASIC METABOLIC PANEL
BUN: 13 mg/dL (ref 6–23)
CO2: 29 mEq/L (ref 19–32)
Calcium: 9 mg/dL (ref 8.4–10.5)
Chloride: 103 mEq/L (ref 96–112)
Creatinine, Ser: 0.98 mg/dL (ref 0.50–1.10)
GFR calc Af Amer: 66 mL/min — ABNORMAL LOW (ref >90)
GFR calc non Af Amer: 57 mL/min — ABNORMAL LOW (ref >90)
Glucose, Bld: 117 mg/dL — ABNORMAL HIGH (ref 70–99)
Potassium: 3.7 mEq/L (ref 3.5–5.1)
Sodium: 137 mEq/L (ref 135–145)

## 2012-09-14 LAB — MAGNESIUM: Magnesium: 1.8 mg/dL (ref 1.5–2.5)

## 2012-09-14 LAB — PHOSPHORUS: Phosphorus: 3.4 mg/dL (ref 2.3–4.6)

## 2012-09-14 MED ORDER — TRAMADOL HCL 50 MG PO TABS
50.0000 mg | ORAL_TABLET | Freq: Four times a day (QID) | ORAL | Status: DC | PRN
  Administered 2012-09-14 – 2012-09-15 (×3): 50 mg via ORAL
  Filled 2012-09-14 (×3): qty 1

## 2012-09-14 MED ORDER — KCL IN DEXTROSE-NACL 20-5-0.45 MEQ/L-%-% IV SOLN
INTRAVENOUS | Status: DC
  Administered 2012-09-14 – 2012-09-15 (×3): via INTRAVENOUS

## 2012-09-14 MED ORDER — INFLUENZA VIRUS VACC SPLIT PF IM SUSP
0.5000 mL | INTRAMUSCULAR | Status: AC
  Administered 2012-09-15: 0.5 mL via INTRAMUSCULAR
  Filled 2012-09-14: qty 0.5

## 2012-09-14 MED ORDER — BIOTENE DRY MOUTH MT LIQD
15.0000 mL | Freq: Two times a day (BID) | OROMUCOSAL | Status: DC
  Administered 2012-09-14 – 2012-09-16 (×2): 15 mL via OROMUCOSAL

## 2012-09-14 NOTE — Progress Notes (Signed)
1 Day Post-Op  Subjective: Tolerating clear liquid diet well. Incisional pain well controlled. Denies nausea or vomiting. No bowel movement or flatus yet.  Objective: Vital signs in last 24 hours: Temp:  [97.3 F (36.3 C)-99 F (37.2 C)] 98.4 F (36.9 C) (10/01 0557) Pulse Rate:  [44-79] 71  (10/01 0557) Resp:  [10-20] 20  (10/01 0557) BP: (106-132)/(52-72) 106/55 mmHg (10/01 0557) SpO2:  [95 %-100 %] 99 % (10/01 0557) Weight:  [71.442 kg (157 lb 8 oz)] 71.442 kg (157 lb 8 oz) (10/01 0557) Last BM Date: 09/12/12  Intake/Output from previous day: 09/30 0701 - 10/01 0700 In: 4416.7 [P.O.:420; I.V.:3596.7; IV Piggyback:400] Out: 1600 [Urine:1575; Blood:25] Intake/Output this shift: Total I/O In: 665 [P.O.:665] Out: -   General appearance: alert, cooperative and appears stated age Resp: clear to auscultation bilaterally Cardio: regular rate and rhythm, S1, S2 normal, no murmur, click, rub or gallop GI: Soft, flat. Incision healing well. Bowel sounds appreciated.  Lab Results:   [facility] 09/14/12 0549  WBC 9.7  HGB 8.4*  HCT 28.3*  PLT 186   BMET  Basename 09/14/12 0549  NA 137  K 3.7  CL 103  CO2 29  GLUCOSE 117*  BUN 13  CREATININE 0.98  CALCIUM 9.0   PT/INR No results found for this basename: LABPROT:2,INR:2 in the last 72 hours  Studies/Results: No results found.  Anti-infectives: Anti-infectives     Start     Dose/Rate Route Frequency Ordered Stop   09/13/12 0807   ertapenem [facility]) 1 g in sodium chloride 0.9 % 50 mL IVPB        1 g 100 mL/hr over 30 Minutes Intravenous 60 min pre-op 09/13/12 0807 09/13/12 0905          Assessment/Plan: s/p Procedure(s): PARTIAL COLECTOMY Impression: Stable on postoperative day one. Anemia secondary to acute surgical blood loss. No need for blood transfusion at this time. Awaiting return of bowel function. Will adjust IV fluids.  LOS: 1 day    Chaeli Judy A 09/14/2012

## 2012-09-14 NOTE — Addendum Note (Signed)
Addendum  created 09/14/12 0950 by Marolyn Hammock, CRNA   Modules edited:Notes Section

## 2012-09-14 NOTE — Progress Notes (Signed)
UR chart review completed.  

## 2012-09-14 NOTE — Care Management Note (Signed)
    Page 1 of 1   09/16/2012     1:29:55 PM   CARE MANAGEMENT NOTE 09/16/2012  Patient:  Carol Rosario, Carol Rosario   Account Number:  [id number]  Date Initiated:  09/14/2012  Documentation initiated by:  Sharrie Rothman  Subjective/Objective Assessment:   Pt admitted from home s/p partial colectomy. Pt lives with 2 sons and a daughter in [place]. Pt will return home at discharge. Pt has a cane, walker, and BSC for home use. Has had HH in the past.     Action/Plan:   No CM or HH needs noted at this time. Will continue to follow.   Anticipated DC Date:  09/16/2012   Anticipated DC Plan:  HOME/SELF CARE      DC Planning Services  CM consult      Choice offered to / List presented to:             Status of service:  Completed, signed off Medicare Important Message given?  YES (If response is "NO", the following Medicare IM given date fields will be blank) Date Medicare IM given:  09/16/2012 Date Additional Medicare IM given:    Discharge Disposition:  HOME/SELF CARE  Per UR Regulation:    If discussed at Long Length of Stay Meetings, dates discussed:    Comments:  09/16/12 1030 Arlyss Queen, RN BSN CM Pt discharged home today. No CM or HH needs noted. 09/14/12 1021 Arlyss Queen, RN BSN CM

## 2012-09-14 NOTE — Anesthesia Postprocedure Evaluation (Signed)
  Anesthesia Post-op Note  Patient: Carol Rosario  Procedure(s) Performed: Procedure(s) (LRB) with comments: PARTIAL COLECTOMY (N/A)  Patient Location: room 212  Anesthesia Type: General  Level of Consciousness: awake, alert , oriented and patient cooperative  Airway and Oxygen Therapy: Patient Spontanous Breathing  Post-op Pain: mild  Post-op Assessment: Post-op Vital signs reviewed, Patient's Cardiovascular Status Stable, Respiratory Function Stable and Patent Airway  Post-op Vital Signs: Reviewed and stable  Complications: No apparent anesthesia complications

## 2012-09-15 LAB — BASIC METABOLIC PANEL
BUN: 8 mg/dL (ref 6–23)
CO2: 31 mEq/L (ref 19–32)
Calcium: 9.4 mg/dL (ref 8.4–10.5)
Chloride: 99 mEq/L (ref 96–112)
Creatinine, Ser: 0.84 mg/dL (ref 0.50–1.10)
GFR calc Af Amer: 80 mL/min — ABNORMAL LOW (ref >90)
GFR calc non Af Amer: 69 mL/min — ABNORMAL LOW (ref >90)
Glucose, Bld: 129 mg/dL — ABNORMAL HIGH (ref 70–99)
Potassium: 3.7 mEq/L (ref 3.5–5.1)
Sodium: 135 mEq/L (ref 135–145)

## 2012-09-15 LAB — CBC
HCT: 31.1 % — ABNORMAL LOW (ref 36.0–46.0)
Hemoglobin: 9.4 g/dL — ABNORMAL LOW (ref 12.0–15.0)
MCH: 24.2 pg — ABNORMAL LOW (ref 26.0–34.0)
MCHC: 30.2 g/dL (ref 30.0–36.0)
MCV: 80.2 fL (ref 78.0–100.0)
Platelets: 232 10*3/uL (ref 150–400)
RBC: 3.88 MIL/uL (ref 3.87–5.11)
RDW: 14.6 % (ref 11.5–15.5)
WBC: 9.4 10*3/uL (ref 4.0–10.5)

## 2012-09-15 LAB — PHOSPHORUS: Phosphorus: 2.3 mg/dL (ref 2.3–4.6)

## 2012-09-15 LAB — MAGNESIUM: Magnesium: 1.6 mg/dL (ref 1.5–2.5)

## 2012-09-15 LAB — GLUCOSE, CAPILLARY: Glucose-Capillary: 86 mg/dL (ref 70–99)

## 2012-09-15 NOTE — Progress Notes (Signed)
2 Days Post-Op  Subjective: Passing flatus, no bowel movement yet. Tolerating clear liquid diet well.  Objective: Vital signs in last 24 hours: Temp:  [98.5 F (36.9 C)-98.9 F (37.2 C)] 98.6 F (37 C) (10/02 0450) Pulse Rate:  [74-94] 79  (10/02 0450) Resp:  [18] 18  (10/02 0450) BP: (129-137)/(61-77) 137/77 mmHg (10/02 0450) SpO2:  [94 %-99 %] 95 % (10/02 0450) Last BM Date: 09/12/12  Intake/Output from previous day: 10/01 0701 - 10/02 0700 In: 3814.2 [P.O.:2055; I.V.:1759.2] Out: 3050 [Urine:3050] Intake/Output this shift: Total I/O In: 120 [P.O.:120] Out: -   General appearance: alert, cooperative and no distress Resp: clear to auscultation bilaterally Cardio: regular rate and rhythm, S1, S2 normal, no murmur, click, rub or gallop GI: Soft, flat. Incision healing well. Bowel sounds appreciated.  Lab Results:   Basename 09/15/12 0535 09/14/12 0549  WBC 9.4 9.7  HGB 9.4* 8.4*  HCT 31.1* 28.3*  PLT 232 186   BMET  Basename 09/15/12 0535 09/14/12 0549  NA 135 137  K 3.7 3.7  CL 99 103  CO2 31 29  GLUCOSE 129* 117*  BUN 8 13  CREATININE 0.84 0.98  CALCIUM 9.4 9.0   PT/INR No results found for this basename: LABPROT:2,INR:2 in the last 72 hours  Studies/Results: No results found.  Anti-infectives: Anti-infectives     Start     Dose/Rate Route Frequency Ordered Stop   09/13/12 0807   ertapenem [facility]) 1 g in sodium chloride 0.9 % 50 mL IVPB        1 g 100 mL/hr over 30 Minutes Intravenous 60 min pre-op 09/13/12 0807 09/13/12 0905          Assessment/Plan: s/p Procedure(s): PARTIAL COLECTOMY Impression: Bowel function slowly returning. We'll advance to full liquid diet. Labs are all within normal limits. Her anemia is resolving. No need for blood transfusion. Final pathology still pending.  LOS: 2 days    Rylend Pietrzak A 09/15/2012

## 2012-09-16 ENCOUNTER — Encounter (HOSPITAL_COMMUNITY): Payer: Self-pay | Admitting: General Surgery

## 2012-09-16 LAB — BASIC METABOLIC PANEL
BUN: 6 mg/dL (ref 6–23)
CO2: 31 mEq/L (ref 19–32)
Calcium: 9.4 mg/dL (ref 8.4–10.5)
Chloride: 101 mEq/L (ref 96–112)
Creatinine, Ser: 0.86 mg/dL (ref 0.50–1.10)
GFR calc Af Amer: 78 mL/min — ABNORMAL LOW (ref >90)
GFR calc non Af Amer: 67 mL/min — ABNORMAL LOW (ref >90)
Glucose, Bld: 86 mg/dL (ref 70–99)
Potassium: 4 mEq/L (ref 3.5–5.1)
Sodium: 137 mEq/L (ref 135–145)

## 2012-09-16 LAB — CBC
HCT: 30.9 % — ABNORMAL LOW (ref 36.0–46.0)
Hemoglobin: 9.2 g/dL — ABNORMAL LOW (ref 12.0–15.0)
MCH: 24.2 pg — ABNORMAL LOW (ref 26.0–34.0)
MCHC: 29.8 g/dL — ABNORMAL LOW (ref 30.0–36.0)
MCV: 81.3 fL (ref 78.0–100.0)
Platelets: 218 10*3/uL (ref 150–400)
RBC: 3.8 MIL/uL — ABNORMAL LOW (ref 3.87–5.11)
RDW: 14.7 % (ref 11.5–15.5)
WBC: 6.1 10*3/uL (ref 4.0–10.5)

## 2012-09-16 NOTE — Progress Notes (Signed)
Patient discharged with instructions given on medications and follow up visits,patient verbalized understanding.No c/o pain or discomfort noted ,family at bedside. Accompanied by staff to an awaiting vehicle.

## 2012-09-16 NOTE — Discharge Summary (Signed)
Physician Discharge Summary  Patient ID: Carol Rosario MRN: [id number] DOB/AGE: 1941-02-28 71 y.o.  Admit date: 09/13/2012 Discharge date: 09/16/2012  Admission Diagnoses: Colon carcinoma  Discharge Diagnoses: Same, T3, N0, M0 adenocarcinoma of sigmoid colon, anemia secondary to acute surgical blood loss, resolving Active Problems:  * No active hospital problems. *    Discharged Condition: good  Hospital Course: Patient is a 71 year old white female who was found on colonoscopy by Dr. Jena Gauss to have a near obstructing malignancy of the sigmoid colon. She presented to [facility] for a partial colectomy. This was performed on 09/13/2012. She tolerated the procedure well. Her postoperative course has been unremarkable. Her diet was without difficulty. Final pathology revealed a T3, N0, M0 adenocarcinoma of the colon. Preoperative CEA level was normal. The patient is being discharged home in good and improving condition.  Treatments: surgery: Partial colectomy on 09/13/2012  Discharge Exam: Blood pressure 131/73, pulse 73, temperature 99.6 F (37.6 C), temperature source Oral, resp. rate 20, height 5\' 5"  (1.651 m), weight 71.442 kg (157 lb 8 oz), SpO2 95.00%. General appearance: alert, cooperative and no distress Resp: clear to auscultation bilaterally Cardio: regular rate and rhythm, S1, S2 normal, no murmur, click, rub or gallop GI: Soft, bowel sounds appreciated. Incision healing well.  Disposition: 01-Home or Self Care     Medication List     As of 09/16/2012  8:56 AM    TAKE these medications         ALPRAZolam 0.5 MG tablet   Commonly known as: XANAX   Take 0.5 mg by mouth at bedtime as needed. Sleep      aspirin 81 MG tablet   Take 81 mg by mouth daily.      fish oil-omega-3 fatty acids 1000 MG capsule   Take 2 g by mouth daily.      hydrocodone-acetaminophen 5-500 MG per capsule   Commonly known as: LORCET-HD   Take 1 capsule by mouth every 6 (six) hours as  needed. Take one tablet by mouth as needed for pain at bedtime      levothyroxine 50 MCG tablet   Commonly known as: SYNTHROID, LEVOTHROID   Take 50 mcg by mouth daily.      lisinopril 20 MG tablet   Commonly known as: PRINIVIL,ZESTRIL   Take 1 tablet (20 mg total) by mouth daily.      PHILLIPS COLON HEALTH Caps   Take 1 capsule by mouth every other day.      simvastatin 40 MG tablet   Commonly known as: ZOCOR   Take 1 tablet (40 mg total) by mouth at bedtime.           Follow-up Information    Follow up with Dalia Heading, MD. Schedule an appointment as soon as possible for a visit on 09/23/2012.   Contact information:   [address] [place] [address] [phone]          Signed: Franky Macho A 09/16/2012, 8:56 AM

## 2012-09-18 LAB — TYPE AND SCREEN
ABO/RH(D): O POS
Antibody Screen: NEGATIVE
Unit division: 0
Unit division: 0

## 2012-09-24 ENCOUNTER — Other Ambulatory Visit (HOSPITAL_COMMUNITY): Payer: Self-pay | Admitting: Internal Medicine

## 2012-09-24 DIAGNOSIS — Z139 Encounter for screening, unspecified: Secondary | ICD-10-CM

## 2012-09-26 NOTE — Progress Notes (Signed)
REVIEWED.  

## 2012-10-18 ENCOUNTER — Ambulatory Visit (HOSPITAL_COMMUNITY)
Admission: RE | Admit: 2012-10-18 | Discharge: 2012-10-18 | Disposition: A | Discharge status: Home / Self Care | Payer: Medicare Other | Source: Ambulatory Visit | Attending: Internal Medicine | Admitting: Internal Medicine

## 2012-10-18 DIAGNOSIS — Z1231 Encounter for screening mammogram for malignant neoplasm of breast: Secondary | ICD-10-CM | POA: Insufficient documentation

## 2012-10-18 DIAGNOSIS — Z139 Encounter for screening, unspecified: Secondary | ICD-10-CM

## 2012-11-26 ENCOUNTER — Other Ambulatory Visit: Payer: Self-pay | Admitting: Adult Health

## 2013-03-15 ENCOUNTER — Ambulatory Visit (INDEPENDENT_AMBULATORY_CARE_PROVIDER_SITE_OTHER): Payer: Medicare Other

## 2013-03-15 ENCOUNTER — Ambulatory Visit (INDEPENDENT_AMBULATORY_CARE_PROVIDER_SITE_OTHER): Payer: Medicare Other | Admitting: Orthopedic Surgery

## 2013-03-15 ENCOUNTER — Encounter: Payer: Self-pay | Admitting: Orthopedic Surgery

## 2013-03-15 VITALS — BP 122/62 | Ht 64.0 in | Wt 158.0 lb

## 2013-03-15 DIAGNOSIS — M25559 Pain in unspecified hip: Secondary | ICD-10-CM

## 2013-03-15 DIAGNOSIS — M25551 Pain in right hip: Secondary | ICD-10-CM

## 2013-03-15 DIAGNOSIS — M5410 Radiculopathy, site unspecified: Secondary | ICD-10-CM

## 2013-03-15 DIAGNOSIS — M549 Dorsalgia, unspecified: Secondary | ICD-10-CM | POA: Insufficient documentation

## 2013-03-15 DIAGNOSIS — M1610 Unilateral primary osteoarthritis, unspecified hip: Secondary | ICD-10-CM

## 2013-03-15 DIAGNOSIS — IMO0002 Reserved for concepts with insufficient information to code with codable children: Secondary | ICD-10-CM

## 2013-03-15 MED ORDER — GABAPENTIN 100 MG PO CAPS: 100.0000 mg | ORAL_CAPSULE | Freq: Three times a day (TID) | ORAL | Status: DC

## 2013-03-15 MED ORDER — METHYLPREDNISOLONE (PAK) 4 MG PO TABS: ORAL_TABLET | ORAL | Status: DC

## 2013-03-15 NOTE — Progress Notes (Signed)
Patient ID: Carol Rosario, female   DOB: 1941/06/26, 72 y.o.   MRN: [id number] Chief Complaint  Patient presents with  . Leg Pain    Right leg pain, no recent injury.    History: right hip and knee pain radiating with occasional back pain.   Quality sharp  Severity 10  Timing constant   Duration ???  Context no trauma   Factors better/worse nothing makes it better it worse with cold weather  Associated symptoms leg gives way   ROS: she listed all normal but I question this   Past Medical History  Diagnosis Date  . Hypertension   . Hyperlipidemia   . Palpitations   . Family history of tobacco abuse and dependence   . Obesity   . DJD (degenerative joint disease)   . Degeneration of lumbar or lumbosacral intervertebral disc   . Sciatica   . History of recurrent UTIs    Past Surgical History  Procedure Laterality Date  . Cataract extraction, bilateral    . Cholecystectomy    . Bilateral tubal ligation    . Vesicovaginal fistula closure w/ tah    . Bladder resuspension    . Hip surgery  2011    left, [facility]  . Knee surgery      left arthroscopy  . Flexible sigmoidoscopy  08/26/2012    Procedure: FLEXIBLE SIGMOIDOSCOPY;  Surgeon: Corbin Ade, MD;  Location: [facility] ENDO SUITE;  Service: Endoscopy;  Laterality: N/A;  . Partial colectomy  09/13/2012    Procedure: PARTIAL COLECTOMY;  Surgeon: Dalia Heading, MD;  Location: [facility] ORS;  Service: General;  Laterality: N/A;   PMD FUSCO   BP 122/62  Ht 5\' 4"  (1.626 m)  Wt 158 lb (71.668 kg)  BMI 27.11 kg/m2 General appearance is normal, the patient is alert and oriented x3 with normal mood and affect. Gait: supported by a cane    The cardiovascular exam reveals normal pulses and temperature without edema or  swelling.  The sensory exam is normal.  There are no pathologic reflexes. Knee rfelexes are 2+   Balance is normal.   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

## 2013-03-15 NOTE — Patient Instructions (Addendum)
Handicap form for the car   Shower chair prescription

## 2013-04-12 ENCOUNTER — Ambulatory Visit (INDEPENDENT_AMBULATORY_CARE_PROVIDER_SITE_OTHER): Payer: Medicare Other | Admitting: Orthopedic Surgery

## 2013-04-12 ENCOUNTER — Encounter: Payer: Self-pay | Admitting: Orthopedic Surgery

## 2013-04-12 VITALS — Ht 64.0 in | Wt 158.0 lb

## 2013-04-12 DIAGNOSIS — M5410 Radiculopathy, site unspecified: Secondary | ICD-10-CM

## 2013-04-12 DIAGNOSIS — M169 Osteoarthritis of hip, unspecified: Secondary | ICD-10-CM

## 2013-04-12 DIAGNOSIS — IMO0002 Reserved for concepts with insufficient information to code with codable children: Secondary | ICD-10-CM

## 2013-04-12 DIAGNOSIS — M549 Dorsalgia, unspecified: Secondary | ICD-10-CM

## 2013-04-12 DIAGNOSIS — M5137 Other intervertebral disc degeneration, lumbosacral region: Secondary | ICD-10-CM

## 2013-04-12 NOTE — Patient Instructions (Signed)
Right total hip   Scheduled for "total hip replacement"  All surgeries come with some risks. The risks associated with knee replacement surgery include but are not limited to:   Bleeding,  Infection,   Stiffness,   Continued pain,   Pulmonary embolus,   Deep vein thrombosis/blood clot.  Infection is a serious complication which may require multiple surgeries   If these risks are not worth taking in light of the pain and/or functional loss you are having please let me know so that we can continue with an alternative treatment.  In preparation for surgery   Please stop any blood thinners and any antiinflammatories: (eg. warfarin, coumadin, ticlid, aspirin, plavix, clopidrel, motrin, advil, aleve, nabumatone, relafen. Diclofenac)

## 2013-04-12 NOTE — Progress Notes (Signed)
Patient ID: Carol Rosario, female   DOB: 1941-09-22, 72 y.o.   MRN: [id number] Chief Complaint  Patient presents with  . Follow-up    4 week recheck on right leg pain.   Ht 5\' 4"  (1.626 m)  Wt 158 lb (71.668 kg)  BMI 27.11 kg/m2  The patient presents back after being on gabapentin for her right radicular leg pain and she has improved with only occasional leg pain improved back pain and now only has anterior thigh and groin pain  She wants to proceed with total hip replacement  She's not having any neurologic symptoms at this time  Ht 5\' 4"  (1.626 m)  Wt 158 lb (71.668 kg)  BMI 27.11 kg/m2 General appearance is normal, the patient is alert and oriented x3 with normal mood and affect. The right lower extremity is shorter compared to the left she has painful range of motion decreased internal rotation strength is normal scans intact distal neurovascular function normal  Diagnosis osteoarthritis right hip Diagnosis degenerative disc disease with spinal stenosis and right leg pain  Plan right total hip

## 2013-04-14 ENCOUNTER — Other Ambulatory Visit: Payer: Self-pay | Admitting: *Deleted

## 2013-04-15 ENCOUNTER — Telehealth: Payer: Self-pay | Admitting: Orthopedic Surgery

## 2013-04-15 NOTE — Telephone Encounter (Signed)
Contacted insurer, [employer], ph [phone], regarding in-patient/admit surgery scheduled 05/02/13 at [facility]; CPT [id number], ICD9 715.95, 715.15.  Spoke with representative Saudia S.  Received notification pre-authorization # [id number].  If additional clinicals are needed, we will be contacted. (Follow up by 04/19/13 if no response.)

## 2013-04-20 NOTE — Telephone Encounter (Signed)
Contacted insurer [employer] to follow up on status.  Per Scheryl Marten, intake department, it is "covered and approved, for dates 05/02/13 through 05/04/13, with same Reference/Notification # [id number].

## 2013-04-25 ENCOUNTER — Other Ambulatory Visit (HOSPITAL_COMMUNITY): Payer: Medicare Other

## 2013-04-26 NOTE — Patient Instructions (Addendum)
Carol Rosario  04/26/2013   Your procedure is scheduled on:  05/02/2013  Report to [facility] at  615  AM.  Call this number if you have problems the morning of surgery: [phone]   Remember:   Do not eat food or drink liquids after midnight.   Take these medicines the morning of surgery with A SIP OF WATER:  Xanax,neurontin,lorcet,levothyroxine,prnivil,medrol   Do not wear jewelry, make-up or nail polish.  Do not wear lotions, powders, or perfumes.   Do not shave 48 hours prior to surgery. Men may shave face and neck.  Do not bring valuables to the hospital.  Contacts, dentures or bridgework may not be worn into surgery.  Leave suitcase in the car. After surgery it may be brought to your room.  For patients admitted to the hospital, checkout time is 11:00 AM the day of discharge.   Patients discharged the day of surgery will not be allowed to drive  home.  Name and phone number of your driver: family  Special Instructions: Shower using CHG 2 nights before surgery and the night before surgery.  If you shower the day of surgery use CHG.  Use special wash - you have one bottle of CHG for all showers.  You should use approximately 1/3 of the bottle for each shower.   Please read over the following fact sheets that you were given: Pain Booklet, Coughing and Deep Breathing, Blood Transfusion Information, Total Joint Packet, MRSA Information, Surgical Site Infection Prevention, Anesthesia Post-op Instructions and Care and Recovery After Surgery Total Hip Replacement Total hip replacement is the replacement of your damaged hip with an artificial hip joint (prosthetic hip joint). The purpose of this surgery is to reduce pain and improve your hip function. LET YOUR CAREGIVER KNOW ABOUT:   Any allergies you have.  Any medicines you are taking, including vitamins, herbs, eyedrops, over-the-counter medicines, and creams.  Any problems you have had with the use of anesthetics.  Family  history of problems with the use of anesthetics.  Any blood disorders you have, including bleeding problems or clotting problems.  Previous surgeries you have had. RISKS AND COMPLICATIONS Generally, total hip replacement is a safe procedure. However, as with any surgical procedure, complications can occur. Complications associated with total hip replacement both during and after the procedure include:  Infection.  Dislocation (the ball of the hip-joint prosthesis comes out of contact with the socket).  Loosening of the stem connected to the ball or socket.  Fracture of the bone while inserting the prosthesis.  Formation of blood clots, which can break loose and travel to and injure your lungs (pulmonary embolus). BEFORE THE PROCEDURE   Your caregiver will instruct you when you need to stop eating and drinking.  Ask your caregiver if you need to change or stop any regular medicines. PROCEDURE Just before the procedure, you will receive medicine that will make you drowsy (sedative) or medicine to make you fall asleep (general anesthetic). This will be given through a tube that is inserted into one of your veins (intravenous [IV] tube). Then you will receive medicine to block pain from the waist down through your legs (spinal block). An incision is made in your hip. Your surgeon will take out any damaged cartilage and bone. Next, your surgeon will insert a prosthetic socket into your pelvic bone. This is usually secured with screws. Then, your surgeon will cut off the ball of your thigh bone (femur) and attach  a prosthetic ball on a stem to your femur. The surgeon then places the ball into the socket and checks the range of motion of your new hip. AFTER THE PROCEDURE  You will be taken to the recovery area where a nurse will watch and check your progress. Once you are awake and stable, you will be taken to a hospital room. You will receive physical therapy until you are doing well and your  caregiver feels it is safe for you to go home. Typically, you will stay in the hospital 1 4 days after your procedure. Document Released: 03/09/2001 Document Revised: 06/01/2012 Document Reviewed: 01/18/2012 [facility] Patient Information 2013 [place], [place]. PATIENT INSTRUCTIONS POST-ANESTHESIA  IMMEDIATELY FOLLOWING SURGERY:  Do not drive or operate machinery for the first twenty four hours after surgery.  Do not make any important decisions for twenty four hours after surgery or while taking narcotic pain medications or sedatives.  If you develop intractable nausea and vomiting or a severe headache please notify your doctor immediately.  FOLLOW-UP:  Please make an appointment with your surgeon as instructed. You do not need to follow up with anesthesia unless specifically instructed to do so.  WOUND CARE INSTRUCTIONS (if applicable):  Keep a dry clean dressing on the anesthesia/puncture wound site if there is drainage.  Once the wound has quit draining you may leave it open to air.  Generally you should leave the bandage intact for twenty four hours unless there is drainage.  If the epidural site drains for more than 36-48 hours please call the anesthesia department.  QUESTIONS?:  Please feel free to call your physician or the hospital operator if you have any questions, and they will be happy to assist you.

## 2013-04-27 ENCOUNTER — Encounter (HOSPITAL_COMMUNITY): Payer: Self-pay | Admitting: Pharmacy Technician

## 2013-04-27 ENCOUNTER — Encounter (HOSPITAL_COMMUNITY): Payer: Self-pay

## 2013-04-27 ENCOUNTER — Encounter (HOSPITAL_COMMUNITY)
Admission: RE | Admit: 2013-04-27 | Discharge: 2013-04-27 | Disposition: A | Discharge status: Home / Self Care | Payer: Medicare Other | Source: Ambulatory Visit | Attending: Orthopedic Surgery | Admitting: Orthopedic Surgery

## 2013-04-27 HISTORY — DX: Anxiety disorder, unspecified: F41.9

## 2013-04-27 HISTORY — DX: Malignant (primary) neoplasm, unspecified: C80.1

## 2013-04-27 HISTORY — DX: Other specified postprocedural states: Z98.890

## 2013-04-27 HISTORY — DX: Hypothyroidism, unspecified: E03.9

## 2013-04-27 HISTORY — DX: Nausea with vomiting, unspecified: R11.2

## 2013-04-27 LAB — BASIC METABOLIC PANEL
BUN: 24 mg/dL — ABNORMAL HIGH (ref 6–23)
CO2: 30 mEq/L (ref 19–32)
Calcium: 9.8 mg/dL (ref 8.4–10.5)
Chloride: 101 mEq/L (ref 96–112)
Creatinine, Ser: 1.36 mg/dL — ABNORMAL HIGH (ref 0.50–1.10)
GFR calc Af Amer: 44 mL/min — ABNORMAL LOW (ref >90)
GFR calc non Af Amer: 38 mL/min — ABNORMAL LOW (ref >90)
Glucose, Bld: 87 mg/dL (ref 70–99)
Potassium: 4.9 mEq/L (ref 3.5–5.1)
Sodium: 138 mEq/L (ref 135–145)

## 2013-04-27 LAB — CBC
HCT: 39.4 % (ref 36.0–46.0)
Hemoglobin: 13.1 g/dL (ref 12.0–15.0)
MCH: 29.6 pg (ref 26.0–34.0)
MCHC: 33.2 g/dL (ref 30.0–36.0)
MCV: 89.1 fL (ref 78.0–100.0)
Platelets: 202 10*3/uL (ref 150–400)
RBC: 4.42 MIL/uL (ref 3.87–5.11)
RDW: 13.9 % (ref 11.5–15.5)
WBC: 5.6 10*3/uL (ref 4.0–10.5)

## 2013-04-27 LAB — SURGICAL PCR SCREEN
MRSA, PCR: NEGATIVE
Staphylococcus aureus: NEGATIVE

## 2013-04-27 LAB — PREPARE RBC (CROSSMATCH)

## 2013-05-02 ENCOUNTER — Inpatient Hospital Stay (HOSPITAL_COMMUNITY): Payer: Medicare Other

## 2013-05-02 ENCOUNTER — Encounter (HOSPITAL_COMMUNITY): Payer: Self-pay | Admitting: Anesthesiology

## 2013-05-02 ENCOUNTER — Inpatient Hospital Stay (HOSPITAL_COMMUNITY): Payer: Medicare Other | Admitting: Anesthesiology

## 2013-05-02 ENCOUNTER — Encounter (HOSPITAL_COMMUNITY)
Admission: RE | Disposition: A | Discharge status: Home Health | Payer: Self-pay | Source: Ambulatory Visit | Attending: Orthopedic Surgery

## 2013-05-02 ENCOUNTER — Encounter (HOSPITAL_COMMUNITY): Payer: Self-pay | Admitting: *Deleted

## 2013-05-02 ENCOUNTER — Inpatient Hospital Stay (HOSPITAL_COMMUNITY)
Admission: RE | Admit: 2013-05-02 | Discharge: 2013-05-05 | DRG: 470 | Disposition: A | Discharge status: Home Health | Payer: Medicare Other | Source: Ambulatory Visit | Attending: Orthopedic Surgery | Admitting: Orthopedic Surgery

## 2013-05-02 DIAGNOSIS — Z79899 Other long term (current) drug therapy: Secondary | ICD-10-CM

## 2013-05-02 DIAGNOSIS — M5430 Sciatica, unspecified side: Secondary | ICD-10-CM | POA: Diagnosis present

## 2013-05-02 DIAGNOSIS — E785 Hyperlipidemia, unspecified: Secondary | ICD-10-CM | POA: Diagnosis present

## 2013-05-02 DIAGNOSIS — M1610 Unilateral primary osteoarthritis, unspecified hip: Principal | ICD-10-CM | POA: Diagnosis present

## 2013-05-02 DIAGNOSIS — F411 Generalized anxiety disorder: Secondary | ICD-10-CM | POA: Diagnosis present

## 2013-05-02 DIAGNOSIS — Z87891 Personal history of nicotine dependence: Secondary | ICD-10-CM

## 2013-05-02 DIAGNOSIS — M169 Osteoarthritis of hip, unspecified: Secondary | ICD-10-CM | POA: Diagnosis present

## 2013-05-02 DIAGNOSIS — I10 Essential (primary) hypertension: Secondary | ICD-10-CM | POA: Diagnosis present

## 2013-05-02 DIAGNOSIS — M5137 Other intervertebral disc degeneration, lumbosacral region: Secondary | ICD-10-CM | POA: Diagnosis present

## 2013-05-02 DIAGNOSIS — E039 Hypothyroidism, unspecified: Secondary | ICD-10-CM | POA: Diagnosis present

## 2013-05-02 DIAGNOSIS — M51379 Other intervertebral disc degeneration, lumbosacral region without mention of lumbar back pain or lower extremity pain: Secondary | ICD-10-CM | POA: Diagnosis present

## 2013-05-02 DIAGNOSIS — D62 Acute posthemorrhagic anemia: Secondary | ICD-10-CM | POA: Diagnosis not present

## 2013-05-02 DIAGNOSIS — Z9049 Acquired absence of other specified parts of digestive tract: Secondary | ICD-10-CM

## 2013-05-02 DIAGNOSIS — Z85038 Personal history of other malignant neoplasm of large intestine: Secondary | ICD-10-CM

## 2013-05-02 HISTORY — PX: TOTAL HIP ARTHROPLASTY: SHX124

## 2013-05-02 LAB — CBC
HCT: 34.6 % — ABNORMAL LOW (ref 36.0–46.0)
Hemoglobin: 10.9 g/dL — ABNORMAL LOW (ref 12.0–15.0)
MCH: 28.7 pg (ref 26.0–34.0)
MCHC: 31.5 g/dL (ref 30.0–36.0)
MCV: 91.1 fL (ref 78.0–100.0)
Platelets: 174 10*3/uL (ref 150–400)
RBC: 3.8 MIL/uL — ABNORMAL LOW (ref 3.87–5.11)
RDW: 13.7 % (ref 11.5–15.5)
WBC: 9 10*3/uL (ref 4.0–10.5)

## 2013-05-02 LAB — CREATININE, SERUM
Creatinine, Ser: 1.21 mg/dL — ABNORMAL HIGH (ref 0.50–1.10)
GFR calc Af Amer: 51 mL/min — ABNORMAL LOW (ref >90)
GFR calc non Af Amer: 44 mL/min — ABNORMAL LOW (ref >90)

## 2013-05-02 SURGERY — ARTHROPLASTY, HIP, TOTAL,POSTERIOR APPROACH
Anesthesia: Spinal | Site: Hip | Laterality: Right | Wound class: Clean

## 2013-05-02 MED ORDER — PREGABALIN 50 MG PO CAPS
50.0000 mg | ORAL_CAPSULE | Freq: Once | ORAL | Status: AC
  Administered 2013-05-02: 50 mg via ORAL

## 2013-05-02 MED ORDER — HYDROMORPHONE HCL PF 1 MG/ML IJ SOLN: 0.5000 mg | INTRAMUSCULAR | Status: DC | PRN

## 2013-05-02 MED ORDER — CELECOXIB 100 MG PO CAPS
400.0000 mg | ORAL_CAPSULE | Freq: Once | ORAL | Status: AC
  Administered 2013-05-02: 400 mg via ORAL

## 2013-05-02 MED ORDER — MAGNESIUM CITRATE PO SOLN: 1.0000 | Freq: Once | ORAL | Status: AC | PRN

## 2013-05-02 MED ORDER — SODIUM CHLORIDE 0.9 % IR SOLN
Status: DC | PRN
  Administered 2013-05-02: 3000 mL

## 2013-05-02 MED ORDER — FENTANYL CITRATE 0.05 MG/ML IJ SOLN: 25.0000 ug | INTRAMUSCULAR | Status: DC | PRN

## 2013-05-02 MED ORDER — ONDANSETRON HCL 4 MG/2ML IJ SOLN
4.0000 mg | Freq: Once | INTRAMUSCULAR | Status: AC
  Administered 2013-05-02: 4 mg via INTRAVENOUS

## 2013-05-02 MED ORDER — GABAPENTIN 100 MG PO CAPS
100.0000 mg | ORAL_CAPSULE | Freq: Three times a day (TID) | ORAL | Status: DC
  Administered 2013-05-02 – 2013-05-05 (×10): 100 mg via ORAL
  Filled 2013-05-02 (×14): qty 1

## 2013-05-02 MED ORDER — PROPOFOL INFUSION 10 MG/ML OPTIME
INTRAVENOUS | Status: DC | PRN
  Administered 2013-05-02: 30 ug/kg/min via INTRAVENOUS
  Administered 2013-05-02: 25 ug/kg/min via INTRAVENOUS

## 2013-05-02 MED ORDER — ALPRAZOLAM 0.5 MG PO TABS
0.5000 mg | ORAL_TABLET | Freq: Every evening | ORAL | Status: DC | PRN
  Administered 2013-05-02 – 2013-05-03 (×2): 0.5 mg via ORAL
  Filled 2013-05-02 (×2): qty 1

## 2013-05-02 MED ORDER — EPHEDRINE SULFATE 50 MG/ML IJ SOLN
INTRAMUSCULAR | Status: DC | PRN
  Administered 2013-05-02: 5 mg via INTRAVENOUS
  Administered 2013-05-02: 15 mg via INTRAVENOUS
  Administered 2013-05-02 (×4): 10 mg via INTRAVENOUS
  Administered 2013-05-02: 5 mg via INTRAVENOUS

## 2013-05-02 MED ORDER — CEFAZOLIN SODIUM-DEXTROSE 2-3 GM-% IV SOLR
INTRAVENOUS | Status: AC
  Filled 2013-05-02: qty 50

## 2013-05-02 MED ORDER — MIDAZOLAM HCL 5 MG/5ML IJ SOLN
INTRAMUSCULAR | Status: DC | PRN
  Administered 2013-05-02 (×2): 1 mg via INTRAVENOUS

## 2013-05-02 MED ORDER — ACETAMINOPHEN 10 MG/ML IV SOLN
INTRAVENOUS | Status: AC
  Filled 2013-05-02: qty 100

## 2013-05-02 MED ORDER — BUPIVACAINE-EPINEPHRINE PF 0.5-1:200000 % IJ SOLN
INTRAMUSCULAR | Status: DC | PRN
  Administered 2013-05-02: 90 mL

## 2013-05-02 MED ORDER — PROPOFOL 10 MG/ML IV EMUL
INTRAVENOUS | Status: AC
  Filled 2013-05-02: qty 20

## 2013-05-02 MED ORDER — DEXAMETHASONE SODIUM PHOSPHATE 4 MG/ML IJ SOLN
4.0000 mg | Freq: Once | INTRAMUSCULAR | Status: AC
  Administered 2013-05-02: 4 mg via INTRAVENOUS

## 2013-05-02 MED ORDER — DEXAMETHASONE SODIUM PHOSPHATE 4 MG/ML IJ SOLN
INTRAMUSCULAR | Status: AC
  Filled 2013-05-02: qty 1

## 2013-05-02 MED ORDER — CELECOXIB 100 MG PO CAPS
ORAL_CAPSULE | ORAL | Status: AC
  Filled 2013-05-02: qty 4

## 2013-05-02 MED ORDER — BUPIVACAINE-EPINEPHRINE PF 0.5-1:200000 % IJ SOLN
INTRAMUSCULAR | Status: AC
  Filled 2013-05-02: qty 10

## 2013-05-02 MED ORDER — PREGABALIN 50 MG PO CAPS
ORAL_CAPSULE | ORAL | Status: AC
  Filled 2013-05-02: qty 1

## 2013-05-02 MED ORDER — BUPIVACAINE IN DEXTROSE 0.75-8.25 % IT SOLN
INTRATHECAL | Status: DC | PRN
  Administered 2013-05-02: 10 mg via INTRATHECAL
  Administered 2013-05-02: 15 mg via INTRATHECAL

## 2013-05-02 MED ORDER — EPHEDRINE SULFATE 50 MG/ML IJ SOLN
INTRAMUSCULAR | Status: AC
  Filled 2013-05-02: qty 1

## 2013-05-02 MED ORDER — SIMVASTATIN 20 MG PO TABS
40.0000 mg | ORAL_TABLET | Freq: Every day | ORAL | Status: DC
  Administered 2013-05-02 – 2013-05-04 (×3): 40 mg via ORAL
  Filled 2013-05-02 (×3): qty 2

## 2013-05-02 MED ORDER — FENTANYL CITRATE 0.05 MG/ML IJ SOLN
INTRAMUSCULAR | Status: AC
  Filled 2013-05-02: qty 2

## 2013-05-02 MED ORDER — BUPIVACAINE-EPINEPHRINE PF 0.5-1:200000 % IJ SOLN
INTRAMUSCULAR | Status: AC
  Filled 2013-05-02: qty 20

## 2013-05-02 MED ORDER — DOCUSATE SODIUM 100 MG PO CAPS
100.0000 mg | ORAL_CAPSULE | Freq: Two times a day (BID) | ORAL | Status: DC
  Administered 2013-05-02 – 2013-05-05 (×7): 100 mg via ORAL
  Filled 2013-05-02 (×7): qty 1

## 2013-05-02 MED ORDER — CELECOXIB 100 MG PO CAPS
200.0000 mg | ORAL_CAPSULE | Freq: Two times a day (BID) | ORAL | Status: DC
  Administered 2013-05-02 – 2013-05-05 (×6): 200 mg via ORAL
  Filled 2013-05-02 (×6): qty 2

## 2013-05-02 MED ORDER — ALUM & MAG HYDROXIDE-SIMETH 200-200-20 MG/5ML PO SUSP: 30.0000 mL | ORAL | Status: DC | PRN

## 2013-05-02 MED ORDER — OXYCODONE HCL 5 MG PO TABS
5.0000 mg | ORAL_TABLET | Freq: Once | ORAL | Status: AC
  Administered 2013-05-02: 5 mg via ORAL

## 2013-05-02 MED ORDER — BUPIVACAINE IN DEXTROSE 0.75-8.25 % IT SOLN
INTRATHECAL | Status: AC
  Filled 2013-05-02: qty 2

## 2013-05-02 MED ORDER — CEFAZOLIN SODIUM-DEXTROSE 2-3 GM-% IV SOLR
2.0000 g | INTRAVENOUS | Status: AC
  Administered 2013-05-02: 2 g via INTRAVENOUS

## 2013-05-02 MED ORDER — METOCLOPRAMIDE HCL 5 MG/ML IJ SOLN: 5.0000 mg | Freq: Three times a day (TID) | INTRAMUSCULAR | Status: DC | PRN

## 2013-05-02 MED ORDER — DIPHENHYDRAMINE HCL 12.5 MG/5ML PO ELIX: 12.5000 mg | ORAL_SOLUTION | ORAL | Status: DC | PRN

## 2013-05-02 MED ORDER — ACETAMINOPHEN 10 MG/ML IV SOLN
1000.0000 mg | Freq: Once | INTRAVENOUS | Status: AC
  Administered 2013-05-02: 1000 mg via INTRAVENOUS

## 2013-05-02 MED ORDER — ENOXAPARIN SODIUM 40 MG/0.4ML ~~LOC~~ SOLN: 40.0000 mg | SUBCUTANEOUS | Status: DC

## 2013-05-02 MED ORDER — LACTATED RINGERS IV SOLN
INTRAVENOUS | Status: DC
  Administered 2013-05-02 (×3): via INTRAVENOUS

## 2013-05-02 MED ORDER — MIDAZOLAM HCL 2 MG/2ML IJ SOLN
1.0000 mg | INTRAMUSCULAR | Status: DC | PRN
  Administered 2013-05-02: 2 mg via INTRAVENOUS

## 2013-05-02 MED ORDER — METOCLOPRAMIDE HCL 10 MG PO TABS: 5.0000 mg | ORAL_TABLET | Freq: Three times a day (TID) | ORAL | Status: DC | PRN

## 2013-05-02 MED ORDER — MIDAZOLAM HCL 2 MG/2ML IJ SOLN
INTRAMUSCULAR | Status: AC
  Filled 2013-05-02: qty 2

## 2013-05-02 MED ORDER — METHOCARBAMOL 100 MG/ML IJ SOLN
500.0000 mg | Freq: Once | INTRAVENOUS | Status: DC
  Administered 2013-05-02: 500 mg via INTRAVENOUS
  Filled 2013-05-02: qty 5

## 2013-05-02 MED ORDER — ONDANSETRON HCL 4 MG/2ML IJ SOLN: 4.0000 mg | Freq: Four times a day (QID) | INTRAMUSCULAR | Status: DC | PRN

## 2013-05-02 MED ORDER — FENTANYL CITRATE 0.05 MG/ML IJ SOLN
INTRAMUSCULAR | Status: DC | PRN
  Administered 2013-05-02: 25 ug via INTRATHECAL

## 2013-05-02 MED ORDER — ONDANSETRON HCL 4 MG/2ML IJ SOLN
INTRAMUSCULAR | Status: AC
  Filled 2013-05-02: qty 2

## 2013-05-02 MED ORDER — LEVOTHYROXINE SODIUM 50 MCG PO TABS
50.0000 ug | ORAL_TABLET | Freq: Every day | ORAL | Status: DC
  Administered 2013-05-02 – 2013-05-05 (×4): 50 ug via ORAL
  Filled 2013-05-02 (×4): qty 1

## 2013-05-02 MED ORDER — METHOCARBAMOL 500 MG PO TABS: 500.0000 mg | ORAL_TABLET | Freq: Four times a day (QID) | ORAL | Status: DC | PRN

## 2013-05-02 MED ORDER — CEFAZOLIN SODIUM-DEXTROSE 2-3 GM-% IV SOLR
2.0000 g | Freq: Four times a day (QID) | INTRAVENOUS | Status: AC
  Administered 2013-05-02 (×2): 2 g via INTRAVENOUS
  Filled 2013-05-02 (×2): qty 50

## 2013-05-02 MED ORDER — DEXTROSE 5 % IV SOLN
500.0000 mg | Freq: Four times a day (QID) | INTRAVENOUS | Status: DC | PRN
  Filled 2013-05-02: qty 5

## 2013-05-02 MED ORDER — LACTATED RINGERS IV SOLN: INTRAVENOUS | Status: DC

## 2013-05-02 MED ORDER — ACETAMINOPHEN 10 MG/ML IV SOLN
1000.0000 mg | Freq: Four times a day (QID) | INTRAVENOUS | Status: DC
  Administered 2013-05-02 – 2013-05-03 (×3): 1000 mg via INTRAVENOUS
  Filled 2013-05-02 (×4): qty 100

## 2013-05-02 MED ORDER — OXYCODONE HCL 5 MG PO TABS
5.0000 mg | ORAL_TABLET | ORAL | Status: DC
  Administered 2013-05-02 (×4): 5 mg via ORAL
  Filled 2013-05-02 (×5): qty 1

## 2013-05-02 MED ORDER — SODIUM CHLORIDE 0.9 % IV SOLN
INTRAVENOUS | Status: DC
  Administered 2013-05-02: 15:00:00 via INTRAVENOUS

## 2013-05-02 MED ORDER — PHENOL 1.4 % MT LIQD: 1.0000 | OROMUCOSAL | Status: DC | PRN

## 2013-05-02 MED ORDER — ONDANSETRON HCL 4 MG/2ML IJ SOLN: 4.0000 mg | Freq: Once | INTRAMUSCULAR | Status: DC | PRN

## 2013-05-02 MED ORDER — MENTHOL 3 MG MT LOZG: 1.0000 | LOZENGE | OROMUCOSAL | Status: DC | PRN

## 2013-05-02 MED ORDER — OXYCODONE HCL 5 MG PO TABS
ORAL_TABLET | ORAL | Status: AC
  Filled 2013-05-02: qty 1

## 2013-05-02 MED ORDER — ENOXAPARIN SODIUM 30 MG/0.3ML ~~LOC~~ SOLN: 30.0000 mg | SUBCUTANEOUS | Status: DC

## 2013-05-02 MED ORDER — LISINOPRIL 10 MG PO TABS
10.0000 mg | ORAL_TABLET | Freq: Every day | ORAL | Status: DC
  Administered 2013-05-02: 10 mg via ORAL
  Filled 2013-05-02 (×2): qty 1

## 2013-05-02 MED ORDER — 0.9 % SODIUM CHLORIDE (POUR BTL) OPTIME
TOPICAL | Status: DC | PRN
  Administered 2013-05-02: 1000 mL

## 2013-05-02 MED ORDER — POLYETHYLENE GLYCOL 3350 17 G PO PACK
17.0000 g | PACK | Freq: Every day | ORAL | Status: DC
  Administered 2013-05-02 – 2013-05-05 (×4): 17 g via ORAL
  Filled 2013-05-02 (×4): qty 1

## 2013-05-02 MED ORDER — ONDANSETRON HCL 4 MG PO TABS: 4.0000 mg | ORAL_TABLET | Freq: Four times a day (QID) | ORAL | Status: DC | PRN

## 2013-05-02 SURGICAL SUPPLY — 64 items
BIT DRILL 2.8X128 (BIT) ×2 IMPLANT
BLADE HEX COATED 2.75 (ELECTRODE) ×2 IMPLANT
BLADE SAGITTAL 25.0X1.27X90 (BLADE) ×2 IMPLANT
BRUSH FEMORAL CANAL (MISCELLANEOUS) IMPLANT
CATH KIT ON Q 2.5IN SLV (PAIN MANAGEMENT) IMPLANT
CLOTH BEACON ORANGE TIMEOUT ST (SAFETY) ×2 IMPLANT
COVER LIGHT HANDLE STERIS (MISCELLANEOUS) ×4 IMPLANT
COVER PROBE W GEL 5X96 (DRAPES) ×2 IMPLANT
DECANTER SPIKE VIAL GLASS SM (MISCELLANEOUS) ×5 IMPLANT
DRAPE BACK TABLE (DRAPES) ×2 IMPLANT
DRAPE HIP W/POCKET STRL (DRAPE) ×2 IMPLANT
DRAPE INCISE IOBAN 44X35 STRL (DRAPES) ×2 IMPLANT
DRAPE U-SHAPE 47X51 STRL (DRAPES) ×2 IMPLANT
DRSG MEPILEX BORDER 4X12 (GAUZE/BANDAGES/DRESSINGS) ×2 IMPLANT
DURAPREP 26ML APPLICATOR (WOUND CARE) ×4 IMPLANT
ELECT REM PT RETURN 9FT ADLT (ELECTROSURGICAL) ×2
ELECTRODE REM PT RTRN 9FT ADLT (ELECTROSURGICAL) ×1 IMPLANT
FACESHIELD OPICON STD (MASK) ×2 IMPLANT
GLOVE BIOGEL PI IND STRL 7.0 (GLOVE) IMPLANT
GLOVE BIOGEL PI INDICATOR 7.0 (GLOVE) ×3
GLOVE ECLIPSE 6.5 STRL STRAW (GLOVE) ×1 IMPLANT
GLOVE ECLIPSE 7.0 STRL STRAW (GLOVE) ×1 IMPLANT
GLOVE EXAM NITRILE MD LF STRL (GLOVE) ×3 IMPLANT
GLOVE OPTIFIT SS 8.0 STRL (GLOVE) ×2 IMPLANT
GLOVE SKINSENSE NS SZ8.0 LF (GLOVE) ×2
GLOVE SKINSENSE STRL SZ8.0 LF (GLOVE) ×2 IMPLANT
GLOVE SS BIOGEL STRL SZ 6.5 (GLOVE) IMPLANT
GLOVE SS N UNI LF 8.5 STRL (GLOVE) ×2 IMPLANT
GLOVE SUPERSENSE BIOGEL SZ 6.5 (GLOVE) ×2
GOWN STRL REIN XL XLG (GOWN DISPOSABLE) ×7 IMPLANT
HANDPIECE INTERPULSE COAX TIP (DISPOSABLE) ×2
HOOD W/PEELAWAY (MISCELLANEOUS) ×8 IMPLANT
INST SET MAJOR BONE (KITS) ×2 IMPLANT
IV NS IRRIG 3000ML ARTHROMATIC (IV SOLUTION) ×2 IMPLANT
KIT BLADEGUARD II DBL (SET/KITS/TRAYS/PACK) ×2 IMPLANT
KIT ROOM TURNOVER APOR (KITS) ×2 IMPLANT
MANIFOLD NEPTUNE II (INSTRUMENTS) ×2 IMPLANT
MARKER SKIN DUAL TIP RULER LAB (MISCELLANEOUS) ×2 IMPLANT
NDL HYPO 21X1.5 SAFETY (NEEDLE) ×1 IMPLANT
NEEDLE HYPO 21X1.5 SAFETY (NEEDLE) ×2 IMPLANT
NS IRRIG 1000ML POUR BTL (IV SOLUTION) ×2 IMPLANT
PACK TOTAL JOINT (CUSTOM PROCEDURE TRAY) ×2 IMPLANT
PAD ARMBOARD 7.5X6 YLW CONV (MISCELLANEOUS) ×2 IMPLANT
PASSER SUT SWANSON 36MM LOOP (INSTRUMENTS) IMPLANT
PILLOW HIP ABDUCTION LRG (ORTHOPEDIC SUPPLIES) IMPLANT
PILLOW HIP ABDUCTION MED (ORTHOPEDIC SUPPLIES) ×1 IMPLANT
PIN STMN 9X.142 IN (PIN) ×4 IMPLANT
SET BASIN LINEN APH (SET/KITS/TRAYS/PACK) ×2 IMPLANT
SET HNDPC FAN SPRY TIP SCT (DISPOSABLE) ×1 IMPLANT
SPONGE LAP 18X18 X RAY DECT (DISPOSABLE) ×2 IMPLANT
STAPLER VISISTAT 35W (STAPLE) ×2 IMPLANT
SUT BRALON NAB BRD #1 30IN (SUTURE) ×6 IMPLANT
SUT ETHIBOND 5 LR DA (SUTURE) ×4 IMPLANT
SUT MNCRL 0 VIOLET CTX 36 (SUTURE) ×1 IMPLANT
SUT MON AB 2-0 CT1 36 (SUTURE) ×2 IMPLANT
SUT MONOCRYL 0 CTX 36 (SUTURE) ×1
SUT VIC AB 1 CT1 27 (SUTURE) ×4
SUT VIC AB 1 CT1 27XBRD ANTBC (SUTURE) ×2 IMPLANT
SYR 30ML LL (SYRINGE) ×2 IMPLANT
SYR BULB IRRIGATION 50ML (SYRINGE) ×2 IMPLANT
TOWEL OR 17X26 4PK STRL BLUE (TOWEL DISPOSABLE) ×2 IMPLANT
TOWER CARTRIDGE SMART MIX (DISPOSABLE) IMPLANT
TRAY FOLEY CATH 14FR (SET/KITS/TRAYS/PACK) ×2 IMPLANT
YANKAUER SUCT 12FT TUBE ARGYLE (SUCTIONS) ×2 IMPLANT

## 2013-05-02 NOTE — Interval H&P Note (Signed)
History and Physical Interval Note:  05/02/2013 7:18 AM  Carol Rosario  has presented today for surgery, with the diagnosis of Osteoarthritis right hip  The various methods of treatment have been discussed with the patient and family. After consideration of risks, benefits and other options for treatment, the patient has consented to  Procedure(s): TOTAL HIP ARTHROPLASTY (Right) as a surgical intervention .  The patient's history has been reviewed, patient examined, no change in status, stable for surgery.  I have reviewed the patient's chart and labs.  Questions were answered to the patient's satisfaction.     Fuller Canada

## 2013-05-02 NOTE — Op Note (Signed)
  DISPOSITION OF SPECIMEN:  N/A  COUNTS:  YES  TOURNIQUET:  * No tourniquets in log *  DICTATION: .Dragon Dictation  PLAN OF CARE: Admit to inpatient   PATIENT DISPOSITION:  PACU - hemodynamically stable.   Delay start of Pharmacological VTE agent (>24hrs) due to surgical blood loss or risk of bleeding: yes

## 2013-05-02 NOTE — Transfer of Care (Signed)
Immediate Anesthesia Transfer of Care Note  Patient: Carol Rosario  Procedure(s) Performed: Procedure(s) with comments: TOTAL HIP ARTHROPLASTY (Right) - Right Total Hip Arthroplasty  Patient Location: PACU  Anesthesia Type:Spinal  Level of Consciousness: awake and patient cooperative  Airway & Oxygen Therapy: Patient Spontanous Breathing and Patient connected to face mask oxygen  Post-op Assessment: Report given to PACU RN and Post -op Vital signs reviewed and stable  Post vital signs: Reviewed and stable  Complications: No apparent anesthesia complications 

## 2013-05-02 NOTE — Anesthesia Preprocedure Evaluation (Signed)
Anesthesia Evaluation  Patient identified by MRN, date of birth, ID band Patient awake    Reviewed: Allergy & Precautions, H&P , NPO status , Patient's Chart, lab work & pertinent test results  History of Anesthesia Complications Negative for: history of anesthetic complications  Airway Mallampati: II  Neck ROM: Full    Dental  (+) Edentulous Upper, Partial Lower, Poor Dentition and Dental Advisory Given   Pulmonary former smoker (am cough),    Pulmonary exam normal       Cardiovascular hypertension, Pt. on medications + dysrhythmias (palpitations) Rhythm:Regular     Neuro/Psych  Neuromuscular disease    GI/Hepatic   Endo/Other    Renal/GU      Musculoskeletal  (+) Arthritis -,   Abdominal   Peds  Hematology   Anesthesia Other Findings   Reproductive/Obstetrics                           Anesthesia Physical Anesthesia Plan  ASA: III  Anesthesia Plan: Spinal   Post-op Pain Management:    Induction:   Airway Management Planned: Nasal Cannula  Additional Equipment:   Intra-op Plan:   Post-operative Plan:   Informed Consent: I have reviewed the patients History and Physical, chart, labs and discussed the procedure including the risks, benefits and alternatives for the proposed anesthesia with the patient or authorized representative who has indicated his/her understanding and acceptance.     Plan Discussed with:   Anesthesia Plan Comments:         Anesthesia Quick Evaluation

## 2013-05-02 NOTE — Transfer of Care (Signed)
Immediate Anesthesia Transfer of Care Note  Patient: Carol Rosario  Procedure(s) Performed: Procedure(s) with comments: TOTAL HIP ARTHROPLASTY (Right) - Right Total Hip Arthroplasty  Patient Location: PACU  Anesthesia Type:Spinal  Level of Consciousness: awake and patient cooperative  Airway & Oxygen Therapy: Patient Spontanous Breathing and Patient connected to face mask oxygen  Post-op Assessment: Report given to PACU RN and Post -op Vital signs reviewed and stable  Post vital signs: Reviewed and stable  Complications: No apparent anesthesia complications

## 2013-05-02 NOTE — Anesthesia Procedure Notes (Signed)
Spinal  Patient location during procedure: OR Start time: 05/02/2013 7:48 AM Staffing CRNA/Resident: Gabrella Stroh J Preanesthetic Checklist Completed: patient identified, site marked, surgical consent, pre-op evaluation, timeout performed, IV checked, risks and benefits discussed and monitors and equipment checked Spinal Block Patient position: right lateral decubitus Prep: Betadine Patient monitoring: heart rate, cardiac monitor, continuous pulse ox and blood pressure Approach: right paramedian Location: L3-4 Injection technique: single-shot Needle Needle type: Spinocan  Needle gauge: 22 G Assessment Sensory level: T10 Additional Notes CSF slow and clear        [id number]   11/2013

## 2013-05-02 NOTE — Brief Op Note (Addendum)
05/02/2013  10:07 AM  PATIENT:  Carol Rosario  72 y.o. female  PRE-OPERATIVE DIAGNOSIS:  Osteoarthritis right hip  POST-OPERATIVE DIAGNOSIS:  Osteoarthritis right hip  Findings: Osteoarthritis of the right hip with deformation of the femoral head misshapen acetabulum redundant capsule hyperemic capsule acetabular erosion  Implant sizes: 58F, metal 32 + 0 head, 52 cup titanium no screws, XL poly liner    Dictation right total hip arthroplasty The patient was identified in the preoperative holding area and reexamined her leg lengths were equal her right hip was confirmed as a surgical site marked. She was taken to the operating room for spinal anesthetic. Foley catheter was inserted sterilely. Antibiotics were started based on her weight of 71 kg. Ancef was used. The patient was then placed in the lateral decubitus position with the right side up. We used the Stulberg hip positioner. After sterile prep and drape and timeout we began the surgery  Skin incision was made over the greater trochanter just anterior to the midline of the femur this was carried across the trochanter proximally approximately 2 cm. This incision was taken down to the subcutaneous tissue down to the fascia with electrocautery used to control bleeding.  The fascia was split in line with the skin incision and the gluteus medius was identified anteriorly and posteriorly. The vertical fibers were left intact the anterior fibers were split and the gluteus medius and underlying gluteus minimus were resected from the greater trochanter and capsule and reflected anteriorly and held with a retractor. These were packed with packing sutures. A capsulectomy was performed and the hip was dislocated anteriorly.  The Accolade femoral cutting guide was used to make a provisional neck cut. This was followed by box cutting osteotome and starter reamer. We then serially broached up to a size 6.  The hip was then placed in extension a  capsulectomy was performed remaining labrum was removed and acetabular ligament was identified. We placed anterior and posterior retractors and 2 Steinmann pins in the pelvis. We started reaming with a 44 reamer and serially reamed up to a size 51. This gave excellent bleeding bone. The cup was aligned in 40 of abduction and 15 of anteversion using the acetabular ligament as a guide along with the bony landmarks.  Reamings from the acetabulum were placed in the bed of the acetabulum for bone graft, A 52 cup was placed without screws. This gave excellent fit and excellent stability. This was tested by trying to move the cup by hand and with the inserter and placed.  A trial reduction was performed with a 32 head 127 stem size 6 with appropriate liner. Trial reduction gave excellent range of motion and restored leg lengths without any noticeable shock. Sleeping position was checked as well. It was stable.  The joint was irrigated and the remaining implants were inserted. Repeat reduction maneuvers and range of motion check was satisfactory.  Drill holes were placed in the greater trochanter #5 Ethibond suture was passed through the drill holes. The sutures were tied with the leg in slight internal rotation. #1 Bralon suture was then used to reapproximate the fascia of the musculature with remaining fascia of the bone. Repeat irrigation was performed  The leg was then placed in abduction, #1 Bralon was used to close the fascia. 30 cc of Marcaine was injected subfascial  Skin was closed with 0 Monocryl and reapproximated with staples, 60 cc of Marcaine was injected.  Sterile dressing was applied  Patient was placed on  regular table leg lengths were checked and they were equal.  Patient taken recovery room in stable condition PROCEDURE:  Procedure(s) with comments: TOTAL HIP ARTHROPLASTY (Right) - Right Total Hip Arthroplasty  SURGEON:  Surgeon(s) and Role:    * Vickki Hearing, MD -  Primary  PHYSICIAN ASSISTANT:   ASSISTANTS: catherine page and debbie dallas   ANESTHESIA:   spinal  EBL:  Total I/O In: 2300 [I.V.:2300] Out: 600 [Urine:300; Blood:300]  BLOOD ADMINISTERED:none  DRAINS: none   LOCAL MEDICATIONS USED: 0.5%  MARCAINE with epi    and Amount: 90 ml  SPECIMEN:  No Specimen  DISPOSITION OF SPECIMEN:  N/A  COUNTS:  YES  TOURNIQUET:  * No tourniquets in log *  DICTATION: .Dragon Dictation  PLAN OF CARE: Admit to inpatient   PATIENT DISPOSITION:  PACU - hemodynamically stable.   Delay start of Pharmacological VTE agent (>24hrs) due to surgical blood loss or risk of bleeding: yes

## 2013-05-02 NOTE — H&P (Signed)
TOTAL HIP ADMISSION H&P  Patient is admitted for right total hip arthroplasty.  Subjective:  Chief Complaint: right hip pain  HPI: Carol Rosario, 72 y.o. female, has a history of pain and functional disability in the right hip(s) due to arthritis and patient has failed non-surgical conservative treatments for greater than 12 weeks to include NSAID's and/or analgesics, use of assistive devices, weight reduction as appropriate and activity modification.  Onset of symptoms was gradual starting 3 years ago with gradually worsening course since that time.The patient noted no past surgery on the right hip(s).  Patient currently rates pain in the right hip at 7 out of 10 with activity. Patient has night pain, worsening of pain with activity and weight bearing, pain that interfers with activities of daily living and pain with passive range of motion. Patient has evidence of subchondral cysts, subchondral sclerosis, periarticular osteophytes and joint space narrowing by imaging studies. This condition presents safety issues increasing the risk of falls. This patient has had left THA.  There is no current active infection.  Patient Active Problem List   Diagnosis Date Noted  . Radicular leg pain 03/15/2013  . Back pain 03/15/2013  . Arthritis pain of hip 03/15/2013  . Anemia 08/09/2012  . Heme positive stool 08/09/2012  . Hypertension 05/18/2012  . Osteoarthritis of hip 10/23/2011  . Hip pain 10/23/2011  . Arthritis of knee, degenerative 09/09/2011  . TOTAL HIP FOLLOW-UP 08/27/2010  . HIP, ARTHRITIS, DEGEN./OSTEO 07/29/2010  . OBESITY 01/16/2010  . DENTAL CARIES 01/16/2010  . SYNCOPE, HX OF 01/16/2010  . 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 SCIATICA 04/17/2008   Past Medical History  Diagnosis Date  . Hypertension   . Hyperlipidemia   . Palpitations   . Family history of tobacco abuse and dependence   . Obesity   . DJD (degenerative joint disease)   . Degeneration of  lumbar or lumbosacral intervertebral disc   . Sciatica   . History of recurrent UTIs   . PONV (postoperative nausea and vomiting)   . Anxiety   . Hypothyroidism   . Cancer 08/2012    colon cancer    Past Surgical History  Procedure Laterality Date  . Cataract extraction, bilateral    . Cholecystectomy    . Bilateral tubal ligation    . Vesicovaginal fistula closure w/ tah    . Bladder resuspension    . Hip surgery  2011    left, [facility]  . Knee surgery      left arthroscopy  . Flexible sigmoidoscopy  08/26/2012    Procedure: FLEXIBLE SIGMOIDOSCOPY;  Surgeon: Corbin Ade, MD;  Location: [facility] SUITE;  Service: Endoscopy;  Laterality: N/A;  . Partial colectomy  09/13/2012    Procedure: PARTIAL COLECTOMY;  Surgeon: Dalia Heading, MD;  Location: [facility] ORS;  Service: General;  Laterality: N/A;  . Tubal ligation      Facility-administered medications prior to admission  Medication Dose Route Frequency Provider Last Rate Last Dose  . methylPREDNISolone acetate (DEPO-MEDROL) injection 40 mg  40 mg Intra-articular Once Vickki Hearing, MD      . methylPREDNISolone acetate (DEPO-MEDROL) injection 40 mg  40 mg Intra-articular Once Vickki Hearing, MD       Prescriptions prior to admission  Medication Sig Dispense Refill  . ALPRAZolam (XANAX) 0.5 MG tablet Take 0.5 mg by mouth at bedtime as needed. Sleep      . aspirin 81 MG tablet Take 81 mg by mouth  daily.      . fish oil-omega-3 fatty acids 1000 MG capsule Take 2 g by mouth daily.      Marland Kitchen gabapentin (NEURONTIN) 100 MG capsule Take 1 capsule (100 mg total) by mouth 3 (three) times daily.  90 capsule  2  . levothyroxine (SYNTHROID, LEVOTHROID) 50 MCG tablet Take 50 mcg by mouth daily.      Marland Kitchen PRINIVIL 20 MG tablet TAKE ONE TABLET BY MOUTH DAILY.  90 tablet  2  . Probiotic Product (PHILLIPS COLON HEALTH) CAPS Take 1 capsule by mouth every other day.      Marland Kitchen ZOCOR 40 MG tablet TAKE ONE TABLET BY MOUTH AT BEDTIME.  90 tablet  2   Allergies   Allergen Reactions  . Codeine Shortness Of Breath and Itching    History  Substance Use Topics  . Smoking status: Former Smoker    Types: Cigarettes    Quit date: 08/13/2010  . Smokeless tobacco: Not on file  . Alcohol Use: No    Family History  Problem Relation Age of Onset  . Heart disease Neg Hx   . Arthritis    . Cancer    . Diabetes       Scheduled Meds: .  ceFAZolin (ANCEF) IV  2 g Intravenous On Call to OR   Continuous Infusions: . lactated ringers 50 mL/hr at 05/02/13 0651   PRN Meds:. Review of Systems  All other systems reviewed and are negative.   Occasional cough  Joint pain    Objective:  Physical Exam  Vitals reviewed. Constitutional: She is oriented to person, place, and time. She appears well-developed and well-nourished. No distress.  HENT:  Head: Atraumatic.  Eyes: Conjunctivae are normal.  Neck: Normal range of motion. Neck supple.  Cardiovascular: Normal rate and intact distal pulses.   Respiratory: Effort normal.  GI: Soft. She exhibits no distension.  Musculoskeletal:       Right shoulder: Normal.       Left shoulder: Normal.       Right elbow: Normal.      Left elbow: Normal.       Right wrist: Normal.       Left wrist: Normal.       Right hip: She exhibits decreased range of motion, tenderness, bony tenderness and crepitus. She exhibits no laceration.       Left hip: Normal.       Left knee: Normal.       Right ankle: Normal.       Left ankle: Normal.       Lumbar back: She exhibits decreased range of motion, tenderness, bony tenderness and pain. She exhibits no swelling, no edema, no laceration and normal pulse.  Lymphadenopathy:    She has no cervical adenopathy.  Neurological: She is alert and oriented to person, place, and time. She has normal reflexes.  Skin: Skin is warm and dry. She is not diaphoretic.  Psychiatric: She has a normal mood and affect. Her behavior is normal. Judgment and thought content normal.    Vital  signs in last 24 hours: Temp:  [98 F (36.7 C)] 98 F (36.7 C) (05/19 0643)  Labs: CBC    Component Value Date/Time   WBC 5.6 04/27/2013 1145   RBC 4.42 04/27/2013 1145   HGB 13.1 04/27/2013 1145   HCT 39.4 04/27/2013 1145   PLT 202 04/27/2013 1145   MCV 89.1 04/27/2013 1145   MCH 29.6 04/27/2013 1145   MCHC 33.2 04/27/2013  1145   RDW 13.9 04/27/2013 1145   LYMPHSABS 1.6 09/09/2012 1440   MONOABS 0.4 09/09/2012 1440   EOSABS 0.1 09/09/2012 1440   BASOSABS 0.0 09/09/2012 1440      Estimated body mass index is 27.11 kg/(m^2) as calculated from the following:   Height as of 04/27/13: 5\' 4"  (1.626 m).   Weight as of 04/12/13: 158 lb (71.668 kg).   Imaging Review Plain radiographs demonstrate severe degenerative joint disease of the right hip(s). The bone quality appears to be good for age and reported activity level.  Assessment/Plan:  End stage arthritis, right hip(s)  The patient history, physical examination, clinical judgement of the provider and imaging studies are consistent with end stage degenerative joint disease of the right hip(s) and total hip arthroplasty is deemed medically necessary. The treatment options including medical management, injection therapy, arthroscopy and arthroplasty were discussed at length. The risks and benefits of total hip arthroplasty were presented and reviewed. The risks due to aseptic loosening, infection, stiffness, dislocation/subluxation,  thromboembolic complications and other imponderables were discussed.  The patient acknowledged the explanation, agreed to proceed with the plan and consent was signed. Patient is being admitted for inpatient treatment for surgery, pain control, PT, OT, prophylactic antibiotics, VTE prophylaxis, progressive ambulation and ADL's and discharge planning.The patient is planning to be discharged to skilled nursing facility

## 2013-05-02 NOTE — Progress Notes (Signed)
Utilization Review Complete  

## 2013-05-02 NOTE — Evaluation (Signed)
Physical Therapy Evaluation Patient Details Name: Carol Rosario MRN: [id number] DOB: [birth year] Today's Date: 05/02/2013 Time: 6578-4696 PT Time Calculation (min): 50 min  PT Assessment / Plan / Recommendation Clinical Impression  Pt was seen for evaluation/tx following R THR.  She is very alert and oriented, no c/o following surgery this AM.  She is familiar with the rehab protocol from L THR done 2 years ago.  She was able to tolerate ther ex in the bed, almost able to do hip/knee flexion while supine independently.  She was able to transfer OOB with min assist and ambulated with a walker, WBAT R, 18'.  She plans to go home at discharge and I think this is entirely reasonable.    PT Assessment  Patient needs continued PT services    Follow Up Recommendations  Home health PT    Does the patient have the potential to tolerate intense rehabilitation      Barriers to Discharge None      Equipment Recommendations  None recommended by PT    Recommendations for Other Services     Frequency 7X/week    Precautions / Restrictions Precautions Precautions: Anterior Hip Precaution Booklet Issued: No Precaution Comments: pt already has one Restrictions Weight Bearing Restrictions: No RLE Weight Bearing: Weight bearing as tolerated   Pertinent Vitals/Pain       Mobility  Bed Mobility Bed Mobility: Supine to Sit Supine to Sit: 4: Min assist;HOB elevated Transfers Transfers: Sit to Stand;Stand to Sit Sit to Stand: 4: Min guard;With upper extremity assist;From bed Stand to Sit: 4: Min guard;To chair/3-in-1;With upper extremity assist Ambulation/Gait Ambulation/Gait Assistance: 4: Min assist Ambulation Distance (Feet): 18 Feet Assistive device: Rolling walker Gait Pattern: Decreased step length - right;Decreased hip/knee flexion - right;Decreased stance time - right Stairs: No Wheelchair Mobility Wheelchair Mobility: No    Exercises Total Joint Exercises Ankle Circles/Pumps:  AROM;Both;10 reps;Supine Quad Sets: AROM;Both;10 reps;Supine Gluteal Sets: AROM;Both;10 reps;Supine Short Arc Quad: AAROM;Both;10 reps;Supine Heel Slides: AAROM;Both;10 reps;Supine   PT Diagnosis: Difficulty walking;Generalized weakness;Acute pain  PT Problem List: Decreased strength;Decreased range of motion;Decreased activity tolerance;Decreased mobility;Decreased knowledge of precautions;Pain PT Treatment Interventions: DME instruction;Gait training;Functional mobility training;Therapeutic exercise;Patient/family education;Therapeutic activities   PT Goals Acute Rehab PT Goals PT Goal Formulation: With patient Time For Goal Achievement: 05/16/13 Potential to Achieve Goals: Good Pt will go Supine/Side to Sit: with modified independence;with HOB 0 degrees PT Goal: Supine/Side to Sit - Progress: Goal set today Pt will go Sit to Supine/Side: with modified independence;with HOB 0 degrees PT Goal: Sit to Supine/Side - Progress: Goal set today Pt will go Sit to Stand: with modified independence;with upper extremity assist PT Goal: Sit to Stand - Progress: Goal set today Pt will go Stand to Sit: with modified independence;with upper extremity assist PT Goal: Stand to Sit - Progress: Goal set today Pt will Transfer Bed to Chair/Chair to Bed: with supervision PT Transfer Goal: Bed to Chair/Chair to Bed - Progress: Goal set today Pt will Ambulate: 16 - 50 feet;with supervision;with rolling walker PT Goal: Ambulate - Progress: Goal set today  Visit Information  Last PT Received On: 05/02/13    Subjective Data  Subjective: feels OK Patient Stated Goal: return home   Prior Functioning  Home Living Lives With: Family Available Help at Discharge: Family;Available 24 hours/day Type of Home: House Home Access: Ramped entrance Home Layout: One level Bathroom Shower/Tub: Tub/shower unit;Walk-in shower Bathroom Toilet: Standard Home Adaptive Equipment: Walker - rolling;Straight cane;Bedside  commode/3-in-1;Hand-held shower hose Prior  Function Level of Independence: Independent with assistive device(s) Able to Take Stairs?: Yes Driving: No Vocation: Retired Musician: No difficulties    Copywriter, advertising Arousal/Alertness: Awake/alert Behavior During Therapy: WFL for tasks assessed/performed Overall Cognitive Status: Within Functional Limits for tasks assessed    Extremity/Trunk Assessment Right Lower Extremity Assessment RLE ROM/Strength/Tone: Deficits RLE ROM/Strength/Tone Deficits: Mild weakness in R hip due to recent surgery, but already can do a heel slide with minimal assist RLE Sensation: WFL - Light Touch Left Lower Extremity Assessment LLE ROM/Strength/Tone: Within functional levels LLE Sensation: WFL - Light Touch LLE Coordination: WFL - gross motor Trunk Assessment Trunk Assessment: Normal   Balance    End of Session PT - End of Session Equipment Utilized During Treatment: Gait belt;Oxygen Activity Tolerance: Patient tolerated treatment well Patient left: in chair;with call bell/phone within reach Nurse Communication: Mobility status  GP     Konrad Penta 05/02/2013, 4:15 PM

## 2013-05-02 NOTE — Anesthesia Postprocedure Evaluation (Signed)
  Anesthesia Post-op Note  Patient: Carol Rosario  Procedure(s) Performed: Procedure(s) with comments: TOTAL HIP ARTHROPLASTY (Right) - Right Total Hip Arthroplasty  Patient Location: PACU  Anesthesia Type:Spinal  Level of Consciousness: awake, alert , oriented and patient cooperative  Airway and Oxygen Therapy: Patient Spontanous Breathing  Post-op Pain: 3 /10, mild  Post-op Assessment: Post-op Vital signs reviewed, Patient's Cardiovascular Status Stable, Respiratory Function Stable, Patent Airway, No signs of Nausea or vomiting and Pain level controlled  Post-op Vital Signs: Reviewed and stable  Complications: No apparent anesthesia complications

## 2013-05-02 NOTE — Anesthesia Postprocedure Evaluation (Signed)
  Anesthesia Post-op Note  Patient: Carol Rosario  Procedure(s) Performed: Procedure(s) with comments: TOTAL HIP ARTHROPLASTY (Right) - Right Total Hip Arthroplasty  Patient Location: PACU  Anesthesia Type:Spinal  Level of Consciousness: awake and patient cooperative  Airway and Oxygen Therapy: Patient Spontanous Breathing and Patient connected to face mask oxygen  Post-op Pain: none  Post-op Assessment: Post-op Vital signs reviewed, Patient's Cardiovascular Status Stable, Respiratory Function Stable, Patent Airway, No signs of Nausea or vomiting and Pain level controlled  Post-op Vital Signs: Reviewed and stable  Complications: No apparent anesthesia complications

## 2013-05-02 NOTE — Clinical Social Work Note (Signed)
CSW received consult for SNF placement. Pt worked with PT this afternoon and recommendation is for home with home health. CSW signing off but can be reconsulted if needed.  Derenda Fennel, [place] [phone]

## 2013-05-03 LAB — BASIC METABOLIC PANEL
BUN: 22 mg/dL (ref 6–23)
CO2: 30 mEq/L (ref 19–32)
Calcium: 8.4 mg/dL (ref 8.4–10.5)
Chloride: 103 mEq/L (ref 96–112)
Creatinine, Ser: 1.28 mg/dL — ABNORMAL HIGH (ref 0.50–1.10)
GFR calc Af Amer: 48 mL/min — ABNORMAL LOW (ref >90)
GFR calc non Af Amer: 41 mL/min — ABNORMAL LOW (ref >90)
Glucose, Bld: 173 mg/dL — ABNORMAL HIGH (ref 70–99)
Potassium: 5 mEq/L (ref 3.5–5.1)
Sodium: 137 mEq/L (ref 135–145)

## 2013-05-03 LAB — CBC
HCT: 26.6 % — ABNORMAL LOW (ref 36.0–46.0)
Hemoglobin: 8.6 g/dL — ABNORMAL LOW (ref 12.0–15.0)
MCH: 29.3 pg (ref 26.0–34.0)
MCHC: 32.3 g/dL (ref 30.0–36.0)
MCV: 90.5 fL (ref 78.0–100.0)
Platelets: 148 10*3/uL — ABNORMAL LOW (ref 150–400)
RBC: 2.94 MIL/uL — ABNORMAL LOW (ref 3.87–5.11)
RDW: 13.6 % (ref 11.5–15.5)
WBC: 9.6 10*3/uL (ref 4.0–10.5)

## 2013-05-03 MED ORDER — OXYCODONE-ACETAMINOPHEN 5-325 MG PO TABS
1.0000 | ORAL_TABLET | ORAL | Status: DC
  Administered 2013-05-03 – 2013-05-05 (×13): 1 via ORAL
  Filled 2013-05-03 (×13): qty 1

## 2013-05-03 NOTE — Care Management Note (Signed)
    Page 1 of 1   05/05/2013     10:16:14 AM   CARE MANAGEMENT NOTE 05/05/2013  Patient:  Carol Rosario, Carol Rosario   Account Number:  [id number]  Date Initiated:  05/03/2013  Documentation initiated by:  Rosemary Holms  Subjective/Objective Assessment:   Pt admitted from home with family. Post op from hip surgery and doing very well. Pt anticipating DC to home with [facility] PT. Previous [facility] and would like to have [facility] again. No DME needs identified     Action/Plan:   Anticipated DC Date:  05/05/2013   Anticipated DC Plan:  HOME W HOME HEALTH SERVICES      DC Planning Services  CM consult      Choice offered to / List presented to:          [facility] arranged  HH-10 DISEASE MANAGEMENT  HH-1 RN  HH-2 PT      HH agency  [facility]   Status of service:  Completed, signed off Medicare Important Message given?  YES (If response is "NO", the following Medicare IM given date fields will be blank) Date Medicare IM given:  05/05/2013 Date Additional Medicare IM given:    Discharge Disposition:    Per UR Regulation:    If discussed at Long Length of Stay Meetings, dates discussed:    Comments:  05/03/13 Rosemary Holms RN BSN CM

## 2013-05-03 NOTE — Progress Notes (Signed)
Physical Therapy Treatment Patient Details Name: Carol Rosario MRN: [id number] DOB: Feb 26, 1941 Today's Date: 05/03/2013 Time: 4098-1191 PT Time Calculation (min): 35 min  PT Assessment / Plan / Recommendation Comments on Treatment Session  Pt is doing extremely well.  She is now off of O2 and requiring minimal pain meds.  She was instructed in anterior hip precautions, but still needs cueing to remember what they are.  She is able to transfer OOB with no assistance and ambulated 150' with a walker and good gait pattern.    Follow Up Recommendations        Does the patient have the potential to tolerate intense rehabilitation     Barriers to Discharge        Equipment Recommendations       Recommendations for Other Services    Frequency     Plan Discharge plan remains appropriate;Frequency remains appropriate    Precautions / Restrictions Restrictions Weight Bearing Restrictions: No RLE Weight Bearing: Weight bearing as tolerated   Pertinent Vitals/Pain     Mobility  Bed Mobility Bed Mobility: Supine to Sit Supine to Sit: 6: Modified independent (Device/Increase time) Details for Bed Mobility Assistance: pt instructed in correct technique Transfers Sit to Stand: 5: Supervision;From bed Stand to Sit: 5: Supervision;To chair/3-in-1 Details for Transfer Assistance: instructed in hand placement Ambulation/Gait Ambulation/Gait Assistance: 5: Supervision Ambulation Distance (Feet): 150 Feet Assistive device: Rolling walker Gait Pattern: Step-through pattern;Within Functional Limits General Gait Details: has a very good gait pattern Stairs: No Wheelchair Mobility Wheelchair Mobility: No    Exercises Total Joint Exercises Ankle Circles/Pumps: AROM;Both;10 reps;Supine Quad Sets: AROM;Both;10 reps;Supine Gluteal Sets: AROM;Both;10 reps;Supine Short Arc Quad: AROM;Right;10 reps;Supine Heel Slides: AAROM;10 reps;Supine   PT Diagnosis:    PT Problem List:   PT Treatment  Interventions:     PT Goals Acute Rehab PT Goals PT Goal: Supine/Side to Sit - Progress: Met PT Goal: Sit to Stand - Progress: Progressing toward goal PT Goal: Stand to Sit - Progress: Progressing toward goal Pt will Ambulate: >150 feet;with modified independence PT Goal: Ambulate - Progress: Updated due to goal met  Visit Information  Last PT Received On: 05/03/13    Subjective Data  Subjective: feels good   Cognition       Balance     End of Session PT - End of Session Equipment Utilized During Treatment: Gait belt Activity Tolerance: Patient tolerated treatment well Patient left: in chair;with call bell/phone within reach Nurse Communication: Mobility status   GP     Konrad Penta 05/03/2013, 9:28 AM

## 2013-05-03 NOTE — Progress Notes (Signed)
Physical Therapy Treatment Patient Details Name: Carol Rosario MRN: [id number] DOB: 09-Nov-1941 Today's Date: 05/03/2013 Time: 1132-1202 PT Time Calculation (min): 30 min  PT Assessment / Plan / Recommendation Comments on Treatment Session  Pt continues to do well.  To receive blood this afternoon.  Will continue with gait traing and steps tomorrow.    Follow Up Recommendations        Does the patient have the potential to tolerate intense rehabilitation     Barriers to Discharge        Equipment Recommendations       Recommendations for Other Services    Frequency     Plan Discharge plan remains appropriate;Frequency remains appropriate    Precautions / Restrictions Restrictions Weight Bearing Restrictions: No RLE Weight Bearing: Weight bearing as tolerated   Pertinent Vitals/Pain     Mobility  Bed Mobility Bed Mobility: Supine to Sit Supine to Sit: 6: Modified independent (Device/Increase time) Details for Bed Mobility Assistance: pt instructed in correct technique Transfers Sit to Stand: 5: Supervision;From chair/3-in-1 Stand to Sit: 5: Supervision;To chair/3-in-1 Details for Transfer Assistance: instructed in hand placement Ambulation/Gait Ambulation/Gait Assistance: 5: Supervision Ambulation Distance (Feet): 150 Feet Assistive device: Rolling walker Gait Pattern: Decreased stance time - right General Gait Details: has a very good gait pattern Stairs: No Wheelchair Mobility Wheelchair Mobility: No    Exercises Total Joint Exercises Ankle Circles/Pumps: AROM;Both;10 reps;Supine Quad Sets: AROM;Both;10 reps;Supine Gluteal Sets: AROM;Both;10 reps;Supine Short Arc Quad: AROM;Right;10 reps;Supine Heel Slides: AROM;Right;10 reps;Seated Long Arc Quad: AROM;Right;Left;10 reps;Seated   PT Diagnosis:    PT Problem List:   PT Treatment Interventions:     PT Goals Acute Rehab PT Goals PT Goal: Supine/Side to Sit - Progress: Met PT Goal: Sit to Stand -  Progress: Progressing toward goal PT Goal: Stand to Sit - Progress: Progressing toward goal Pt will Ambulate: >150 feet;with modified independence PT Goal: Ambulate - Progress: Updated due to goal met Pt will Go Up / Down Stairs: 1-2 stairs;with supervision;with rail(s) PT Goal: Up/Down Stairs - Progress: Goal set today  Visit Information  Last PT Received On: 05/03/13    Subjective Data  Subjective: R leg is sore   Cognition       Balance     End of Session PT - End of Session Equipment Utilized During Treatment: Gait belt Activity Tolerance: Patient tolerated treatment well Patient left: in chair;with call bell/phone within reach;with nursing in room Nurse Communication: Mobility status   GP     Konrad Penta 05/03/2013, 12:07 PM

## 2013-05-03 NOTE — Progress Notes (Signed)
Subjective: 1 Day Post-Op Procedure(s) (LRB): TOTAL HIP ARTHROPLASTY (Right) Patient reports pain as soreness.    Objective: Vital signs in last 24 hours: Temp:  [97.5 F (36.4 C)-98.2 F (36.8 C)] 97.5 F (36.4 C) (05/20 0612) Pulse Rate:  [70-112] 70 (05/20 0612) Resp:  [12-20] 18 (05/20 0612) BP: (87-130)/(55-76) 93/59 mmHg (05/20 0612) SpO2:  [92 %-98 %] 95 % (05/20 0612) Weight:  [176 lb 12.9 oz (80.2 kg)] 176 lb 12.9 oz (80.2 kg) (05/19 1149)  Intake/Output from previous day: 05/19 0701 - 05/20 0700 In: 3000 [P.O.:600; I.V.:2400] Out: 3150 [Urine:2850; Blood:300] Intake/Output this shift:     Recent Labs  05/02/13 1246 05/03/13 0619  HGB 10.9* 8.6*    Recent Labs  05/02/13 1246 05/03/13 0619  WBC 9.0 9.6  RBC 3.80* 2.94*  HCT 34.6* 26.6*  PLT 174 148*    Recent Labs  05/02/13 1246 05/03/13 0619  NA  --  137  K  --  5.0  CL  --  103  CO2  --  30  BUN  --  22  CREATININE 1.21* 1.28*  GLUCOSE  --  173*  CALCIUM  --  8.4   No results found for this basename: LABPT, INR,  in the last 72 hours  Neurologically intact Neurovascular intact Sensation intact distally Intact pulses distally Dorsiflexion/Plantar flexion intact  Assessment/Plan: Acute blood loss anemia post op expected  1 Day Post-Op Procedure(s) (LRB): TOTAL HIP ARTHROPLASTY (Right) Advance diet Up with therapy D/C IV fluids Hold lovenox continue PT as ordered   Fuller Canada 05/03/2013, 7:15 AM

## 2013-05-03 NOTE — Anesthesia Postprocedure Evaluation (Signed)
  Anesthesia Post-op Note  Patient: Carol Rosario  Procedure(s) Performed: Procedure(s) with comments: TOTAL HIP ARTHROPLASTY (Right) - Right Total Hip Arthroplasty  Patient Location:Room 332  Anesthesia Type:Spinal  Level of Consciousness: awake, alert , oriented and patient cooperative  Airway and Oxygen Therapy: Patient Spontanous Breathing  Post-op Pain: mild  Post-op Assessment: Post-op Vital signs reviewed, Patient's Cardiovascular Status Stable, Respiratory Function Stable, Patent Airway, No signs of Nausea or vomiting, Adequate PO intake and Pain level controlled  Post-op Vital Signs: Reviewed and stable  Complications: No apparent anesthesia complications

## 2013-05-03 NOTE — Progress Notes (Signed)
IVF to [facility] per MD order - pt tolerating PO well

## 2013-05-04 ENCOUNTER — Encounter (HOSPITAL_COMMUNITY): Payer: Self-pay | Admitting: Orthopedic Surgery

## 2013-05-04 LAB — CBC
HCT: 31.3 % — ABNORMAL LOW (ref 36.0–46.0)
Hemoglobin: 10.4 g/dL — ABNORMAL LOW (ref 12.0–15.0)
MCH: 30.1 pg (ref 26.0–34.0)
MCHC: 33.2 g/dL (ref 30.0–36.0)
MCV: 90.7 fL (ref 78.0–100.0)
Platelets: 135 10*3/uL — ABNORMAL LOW (ref 150–400)
RBC: 3.45 MIL/uL — ABNORMAL LOW (ref 3.87–5.11)
RDW: 14.2 % (ref 11.5–15.5)
WBC: 8.1 10*3/uL (ref 4.0–10.5)

## 2013-05-04 MED ORDER — LISINOPRIL 5 MG PO TABS
2.5000 mg | ORAL_TABLET | Freq: Every day | ORAL | Status: DC
  Administered 2013-05-04 – 2013-05-05 (×2): 2.5 mg via ORAL
  Filled 2013-05-04 (×2): qty 1

## 2013-05-04 MED ORDER — SODIUM CHLORIDE 0.9 % IJ SOLN
3.0000 mL | Freq: Two times a day (BID) | INTRAMUSCULAR | Status: DC
  Administered 2013-05-04 (×2): 3 mL via INTRAVENOUS

## 2013-05-04 MED ORDER — MAGNESIUM HYDROXIDE 400 MG/5ML PO SUSP
15.0000 mL | Freq: Every day | ORAL | Status: DC
  Administered 2013-05-04 – 2013-05-05 (×2): 15 mL via ORAL
  Filled 2013-05-04 (×2): qty 30

## 2013-05-04 MED ORDER — POLYETHYLENE GLYCOL 3350 17 G PO PACK: 17.0000 g | PACK | Freq: Every day | ORAL | Status: DC

## 2013-05-04 MED ORDER — KETOROLAC TROMETHAMINE 30 MG/ML IJ SOLN
30.0000 mg | Freq: Four times a day (QID) | INTRAMUSCULAR | Status: AC
  Administered 2013-05-04 (×3): 30 mg via INTRAVENOUS
  Filled 2013-05-04 (×3): qty 1

## 2013-05-04 MED ORDER — SODIUM CHLORIDE 0.9 % IV SOLN: 250.0000 mL | INTRAVENOUS | Status: DC | PRN

## 2013-05-04 MED ORDER — SODIUM CHLORIDE 0.9 % IJ SOLN: 3.0000 mL | INTRAMUSCULAR | Status: DC | PRN

## 2013-05-04 MED ORDER — ASPIRIN EC 325 MG PO TBEC
325.0000 mg | DELAYED_RELEASE_TABLET | Freq: Two times a day (BID) | ORAL | Status: DC
  Administered 2013-05-04 – 2013-05-05 (×3): 325 mg via ORAL
  Filled 2013-05-04 (×3): qty 1

## 2013-05-04 NOTE — Progress Notes (Signed)
Physical Therapy Treatment Patient Details Name: Carol Rosario MRN: [id number] DOB: 1941-03-09 Today's Date: 05/04/2013 Time: 6962-9528 PT Time Calculation (min): 26 min  PT Assessment / Plan / Recommendation Comments on Treatment Session  Instructed stair training for safe mechanics for return home.  Pt able to demonstrate and verbalize appropriate technique with stairs ascending and descending.  Increased distance with gait training, supervision with RW x 200 with good gait, min cueing for posture and heel to toe gait pattern with Rt LE.  Pt left in chair with call bell within reach and ice applied to hip for pain control.    Follow Up Recommendations        Does the patient have the potential to tolerate intense rehabilitation     Barriers to Discharge        Equipment Recommendations       Recommendations for Other Services    Frequency     Plan      Precautions / Restrictions Precautions Precautions: Anterior Hip Restrictions Weight Bearing Restrictions: No RLE Weight Bearing: Weight bearing as tolerated    Mobility  Bed Mobility Bed Mobility: Supine to Sit Supine to Sit: 7: Independent Transfers Transfers: Sit to Stand;Stand to Sit Sit to Stand: 5: Supervision;From bed Stand to Sit: 5: Supervision;To chair/3-in-1 Details for Transfer Assistance: instructed in hand placement Ambulation/Gait Ambulation/Gait Assistance: 5: Supervision Ambulation Distance (Feet): 200 Feet Assistive device: Rolling walker Gait Pattern: Decreased stance time - right General Gait Details: has a very good gait pattern Stairs: Yes Stairs Assistance: 5: Supervision Stair Management Technique: Two rails;Forwards Number of Stairs: 3 Wheelchair Mobility Wheelchair Mobility: No    Exercises Total Joint Exercises Ankle Circles/Pumps: AROM;Both;10 reps;Seated Long Arc Quad: AROM;Right;Left;10 reps;Seated Bridges: AROM;Both;Supine;10 reps   PT Diagnosis:    PT Problem List:   PT  Treatment Interventions:     PT Goals Acute Rehab PT Goals PT Goal: Supine/Side to Sit - Progress: Met PT Goal: Sit to Supine/Side - Progress: Met PT Goal: Sit to Stand - Progress: Met PT Goal: Stand to Sit - Progress: Met PT Transfer Goal: Bed to Chair/Chair to Bed - Progress: Met PT Goal: Ambulate - Progress: Met PT Goal: Up/Down Stairs - Progress: Met  Visit Information  Last PT Received On: 05/04/13    Subjective Data  Subjective: Rt hip is sore today.   Cognition  Cognition Arousal/Alertness: Awake/alert Behavior During Therapy: WFL for tasks assessed/performed Overall Cognitive Status: Within Functional Limits for tasks assessed    Balance     End of Session PT - End of Session Equipment Utilized During Treatment: Gait belt Activity Tolerance: Patient tolerated treatment well Patient left: in chair;with call bell/phone within reach;with nursing in room Nurse Communication: Mobility status   GP     Juel Burrow 05/04/2013, 9:48 AM

## 2013-05-04 NOTE — Progress Notes (Signed)
Physical Therapy Treatment Patient Details Name: Carol Rosario MRN: [id number] DOB: 26-Sep-1941 Today's Date: 05/04/2013 Time: 1328-1400 PT Time Calculation (min): 32 min  PT Assessment / Plan / Recommendation Comments on Treatment Session  Safe mechanics noted with hand placement with no cueing required with sit to stands and appropriate mechanics with RW.  Gait training with supervision x 350 ft with min cueing required for posture.  Pt does walk with Rt hip in ER, when questioned pt stated helps reduce pain on Rt knee with ER from previous fall.  Pt left in bed with call bell within reach and bed alarm set.      Follow Up Recommendations        Does the patient have the potential to tolerate intense rehabilitation     Barriers to Discharge        Equipment Recommendations       Recommendations for Other Services    Frequency     Plan      Precautions / Restrictions Precautions Precautions: Anterior Hip Restrictions Weight Bearing Restrictions: No RLE Weight Bearing: Weight bearing as tolerated       Mobility  Bed Mobility Bed Mobility: Supine to Sit Supine to Sit: 7: Independent;With rails Transfers Transfers: Sit to Stand;Stand to Sit Sit to Stand: 5: Supervision;From bed Stand to Sit: 5: Supervision;With upper extremity assist;To bed Details for Transfer Assistance: Pt able to demonstrate safe hand placement with no cueing required Ambulation/Gait Ambulation/Gait Assistance: 5: Supervision Ambulation Distance (Feet): 350 Feet Assistive device: Rolling walker Gait Pattern: Decreased stance time - right General Gait Details: has a very good gait pattern Stairs: No Wheelchair Mobility Wheelchair Mobility: No    Exercises Total Joint Exercises Ankle Circles/Pumps: AROM;Both;10 reps;Supine Quad Sets: AROM;Both;10 reps;Supine Gluteal Sets: AROM;Both;10 reps;Supine Short Arc Quad: AROM;Right;10 reps;Supine Heel Slides: AROM;Right;10 reps;Seated Bridges:  AROM;Both;Supine;10 reps   PT Diagnosis:    PT Problem List:   PT Treatment Interventions:     PT Goals Acute Rehab PT Goals PT Goal: Supine/Side to Sit - Progress: Met PT Goal: Sit to Supine/Side - Progress: Met PT Goal: Sit to Stand - Progress: Met PT Goal: Stand to Sit - Progress: Met PT Transfer Goal: Bed to Chair/Chair to Bed - Progress: Met PT Goal: Ambulate - Progress: Met PT Goal: Up/Down Stairs - Progress: Met  Visit Information  Last PT Received On: 05/04/13    Subjective Data  Subjective: Pt resting in bed upon return for 2nd session today, stated she set up for a while.  Rt hip is sore, pain scale 3-4/10   Cognition  Cognition Arousal/Alertness: Awake/alert Behavior During Therapy: WFL for tasks assessed/performed Overall Cognitive Status: Within Functional Limits for tasks assessed    Balance     End of Session PT - End of Session Equipment Utilized During Treatment: Gait belt Activity Tolerance: Patient tolerated treatment well Patient left: in bed;with call bell/phone within reach;with bed alarm set   GP     Juel Burrow 05/04/2013, 2:00 PM

## 2013-05-04 NOTE — Progress Notes (Signed)
Subjective: 2 Days Post-Op Procedure(s) (LRB): TOTAL HIP ARTHROPLASTY (Right) Patient reports pain as soreness anterior thigh .    Objective: Vital signs in last 24 hours: Temp:  [97.4 F (36.3 C)-98.1 F (36.7 C)] 98.1 F (36.7 C) (05/21 0557) Pulse Rate:  [67-89] 67 (05/21 0557) Resp:  [14-20] 18 (05/21 0557) BP: (85-113)/(49-69) 93/60 mmHg (05/21 0557) SpO2:  [93 %-98 %] 93 % (05/21 0557)  Intake/Output from previous day: 05/20 0701 - 05/21 0700 In: 1805 [P.O.:780; I.V.:1000; Blood:25] Out: -  Intake/Output this shift:     Recent Labs  05/02/13 1246 05/03/13 0619 05/04/13 0610  HGB 10.9* 8.6* 10.4*    Recent Labs  05/03/13 0619 05/04/13 0610  WBC 9.6 8.1  RBC 2.94* 3.45*  HCT 26.6* 31.3*  PLT 148* 135*    Recent Labs  05/02/13 1246 05/03/13 0619  NA  --  137  K  --  5.0  CL  --  103  CO2  --  30  BUN  --  22  CREATININE 1.21* 1.28*  GLUCOSE  --  173*  CALCIUM  --  8.4   No results found for this basename: LABPT, INR,  in the last 72 hours  Neurologically intact Neurovascular intact Sensation intact distally Intact pulses distally Dorsiflexion/Plantar flexion intact Incision: dressing C/D/I  Assessment/Plan: 2 Days Post-Op Procedure(s) (LRB): TOTAL HIP ARTHROPLASTY (Right) Up with therapy Plan for discharge tomorrow Discharge home with home health toradol for thigh pain  Decrease BP med  Miralax and MOM for BM    [employer] 05/04/2013, 7:37 AM

## 2013-05-05 LAB — CBC
HCT: 30.8 % — ABNORMAL LOW (ref 36.0–46.0)
Hemoglobin: 10.1 g/dL — ABNORMAL LOW (ref 12.0–15.0)
MCH: 30.1 pg (ref 26.0–34.0)
MCHC: 32.8 g/dL (ref 30.0–36.0)
MCV: 91.9 fL (ref 78.0–100.0)
Platelets: 140 10*3/uL — ABNORMAL LOW (ref 150–400)
RBC: 3.35 MIL/uL — ABNORMAL LOW (ref 3.87–5.11)
RDW: 14.3 % (ref 11.5–15.5)
WBC: 7.9 10*3/uL (ref 4.0–10.5)

## 2013-05-05 MED ORDER — ASPIRIN 325 MG PO TBEC: 325.0000 mg | DELAYED_RELEASE_TABLET | Freq: Two times a day (BID) | ORAL | Status: AC

## 2013-05-05 MED ORDER — METHOCARBAMOL 500 MG PO TABS: 500.0000 mg | ORAL_TABLET | Freq: Four times a day (QID) | ORAL | Status: DC | PRN

## 2013-05-05 MED ORDER — OXYCODONE-ACETAMINOPHEN 5-325 MG PO TABS: 1.0000 | ORAL_TABLET | ORAL | Status: DC

## 2013-05-05 NOTE — Progress Notes (Signed)
Pt. D/c instructions provided, Iv removed, Prescriptions reviewed and given to patient. Hip precautions reviewed with patient and family, pt. Encouraged to stop smoking, and educationed when to contact MD.

## 2013-05-05 NOTE — Discharge Summary (Addendum)
Physician Discharge Summary  Patient ID: Carol Rosario MRN: [id number] DOB/AGE: 04/10/1941 72 y.o.  Admit date: 05/02/2013 Discharge date: 05/05/2013  Admission Diagnoses: oa right hip   Discharge Diagnoses: oa right hip  Acute blood loss anemia  Active Problems:   Degenerative arthritis of hip   Discharged Condition: good  Hospital Course: surgery May 19th directlateral approach, spinal anesthetic, Stryker implants accolade II, Metal heal Poly liner. Post op hg decrease to 8. Transfused 2 units. lovenox changed to aspirin for DVT prevention.  Consults: None  Significant Diagnostic Studies: none  CBC    Component Value Date/Time   WBC 7.9 05/05/2013 0608   RBC 3.35* 05/05/2013 0608   HGB 10.1* 05/05/2013 0608   HCT 30.8* 05/05/2013 0608   PLT 140* 05/05/2013 0608   MCV 91.9 05/05/2013 0608   MCH 30.1 05/05/2013 0608   MCHC 32.8 05/05/2013 0608   RDW 14.3 05/05/2013 0608   LYMPHSABS 1.6 09/09/2012 1440   MONOABS 0.4 09/09/2012 1440   EOSABS 0.1 09/09/2012 1440   BASOSABS 0.0 09/09/2012 1440     Discharge Exam: Blood pressure 124/69, pulse 73, temperature 97.2 F (36.2 C), temperature source Oral, resp. rate 18, height 5\' 4"  (1.626 m), weight 176 lb 12.9 oz (80.2 kg), SpO2 94.00%. Incision/Wound:C/D/I  Disposition: 01-Home or Self Care  Discharge Orders   Future Appointments Provider Department Dept Phone   05/16/2013 2:30 PM Vickki Hearing, MD [facility] [phone]   Future Orders Complete By Expires     Call MD / Call 911  As directed     Comments:      If you experience chest pain or shortness of breath, CALL 911 and be transported to the hospital emergency room.  If you develope a fever above 101 F, pus (white drainage) or increased drainage or redness at the wound, or calf pain, call your surgeon's office.    Change dressing  As directed     Comments:      You may change your dressing on friday, then change the dressing daily with  sterile 4 x 4 inch gauze dressing and paper tape.  You may clean the incision with alcohol prior to redressing    Constipation Prevention  As directed     Comments:      Drink plenty of fluids.  Prune juice may be helpful.  You may use a stool softener, such as Colace (over the counter) 100 mg twice a day.  Use MiraLax (over the counter) for constipation as needed.    Diet - low sodium heart healthy  As directed     Follow the hip precautions as taught in Physical Therapy  As directed     Increase activity slowly as tolerated  As directed     TED hose  As directed     Comments:      Use stockings (TED hose) for 4 weeks on both leg(s).  You may remove them at night for sleeping.        Medication List    STOP taking these medications       aspirin 81 MG tablet      TAKE these medications       ALPRAZolam 0.5 MG tablet  Commonly known as:  XANAX  Take 0.5 mg by mouth at bedtime as needed. Sleep     aspirin 325 MG EC tablet  Take 1 tablet (325 mg total) by mouth 2 (two) times daily.  fish oil-omega-3 fatty acids 1000 MG capsule  Take 2 g by mouth daily.     gabapentin 100 MG capsule  Commonly known as:  NEURONTIN  Take 1 capsule (100 mg total) by mouth 3 (three) times daily.     levothyroxine 50 MCG tablet  Commonly known as:  SYNTHROID, LEVOTHROID  Take 50 mcg by mouth daily.     methocarbamol 500 MG tablet  Commonly known as:  ROBAXIN  Take 1 tablet (500 mg total) by mouth every 6 (six) hours as needed.     oxyCODONE-acetaminophen 5-325 MG per tablet  Commonly known as:  PERCOCET/ROXICET  Take 1 tablet by mouth every 4 (four) hours.     PHILLIPS COLON HEALTH Caps  Take 1 capsule by mouth every other day.     PRINIVIL 20 MG tablet  Generic drug:  lisinopril  TAKE ONE TABLET BY MOUTH DAILY.     ZOCOR 40 MG tablet  Generic drug:  simvastatin  TAKE ONE TABLET BY MOUTH AT BEDTIME.           Follow-up Information   Follow up with Fuller Canada, MD.    Contact information:   [address], [address], [place] [address] [phone]       Signed: Fuller Canada 05/05/2013, 7:38 AM

## 2013-05-07 LAB — TYPE AND SCREEN
ABO/RH(D): O POS
Antibody Screen: NEGATIVE
Unit division: 0
Unit division: 0

## 2013-05-16 ENCOUNTER — Ambulatory Visit (INDEPENDENT_AMBULATORY_CARE_PROVIDER_SITE_OTHER): Payer: Medicare Other | Admitting: Orthopedic Surgery

## 2013-05-16 ENCOUNTER — Encounter: Payer: Self-pay | Admitting: Orthopedic Surgery

## 2013-05-16 VITALS — BP 106/78 | Ht 64.0 in | Wt 164.0 lb

## 2013-05-16 DIAGNOSIS — Z96649 Presence of unspecified artificial hip joint: Secondary | ICD-10-CM | POA: Insufficient documentation

## 2013-05-16 MED ORDER — OXYCODONE-ACETAMINOPHEN 5-325 MG PO TABS: 1.0000 | ORAL_TABLET | ORAL | Status: DC

## 2013-05-16 MED ORDER — METHOCARBAMOL 500 MG PO TABS: 500.0000 mg | ORAL_TABLET | Freq: Four times a day (QID) | ORAL | Status: DC | PRN

## 2013-05-16 NOTE — Patient Instructions (Addendum)
Wear stockings

## 2013-05-16 NOTE — Progress Notes (Signed)
Patient ID: Carol Rosario, female   DOB: [birth year], 72 y.o.   MRN: [id number] Chief Complaint  Patient presents with  . Follow-up    Post op 1 Right THA DOS 05/02/13   BP 106/78  Ht 5\' 4"  (1.626 m)  Wt 164 lb (74.39 kg)  BMI 28.14 kg/m2  Wound check staples out  A portion of the wound separated treated with Steri-Strips, she will come back next week for a wound check she should change dressing daily I gave her the dressings we gave her some Steri-Strips in case they start to feel back  She's having some swelling in her foot she is advised to do ankle pumps and continue with TED stockings. Calf is nontender Homans sign is negative.

## 2013-05-23 ENCOUNTER — Encounter: Payer: Self-pay | Admitting: Orthopedic Surgery

## 2013-05-23 ENCOUNTER — Ambulatory Visit (INDEPENDENT_AMBULATORY_CARE_PROVIDER_SITE_OTHER): Payer: Medicare Other | Admitting: Orthopedic Surgery

## 2013-05-23 VITALS — BP 104/70 | Ht 64.0 in | Wt 158.0 lb

## 2013-05-23 DIAGNOSIS — Z96649 Presence of unspecified artificial hip joint: Secondary | ICD-10-CM

## 2013-05-23 MED ORDER — SULFAMETHOXAZOLE-TRIMETHOPRIM 800-160 MG PO TABS: 1.0000 | ORAL_TABLET | Freq: Two times a day (BID) | ORAL | Status: DC

## 2013-05-23 NOTE — Patient Instructions (Signed)
DRESSING CHANGES DAILY  APPLY PEROXIDE  THEN TRIPLE ANTIBIOTIC   START BACTRIM 1 PO TWICE A DAY   (PRESCRIPTION IS AT PHARMACY)

## 2013-05-23 NOTE — Progress Notes (Signed)
Patient ID: Carol Rosario, female   DOB: Oct 08, 1941, 72 y.o.   MRN: [id number] Chief Complaint  Patient presents with  . Wound Check    1 week recheck right hip DOS 05/02/13    Right total hip arthroplasty the distal portion of the wound is separated the surrounding areas are erythematous  Recommend antibiotic cream with peroxide and dressing changes daily followup 2 weeks start Bactrim one twice a day

## 2013-05-24 ENCOUNTER — Ambulatory Visit: Payer: Medicare Other | Admitting: Orthopedic Surgery

## 2013-06-06 ENCOUNTER — Ambulatory Visit (INDEPENDENT_AMBULATORY_CARE_PROVIDER_SITE_OTHER): Payer: Medicare Other | Admitting: Orthopedic Surgery

## 2013-06-06 VITALS — BP 140/98 | Ht 64.0 in | Wt 158.0 lb

## 2013-06-06 DIAGNOSIS — Z96649 Presence of unspecified artificial hip joint: Secondary | ICD-10-CM

## 2013-06-06 MED ORDER — SULFAMETHOXAZOLE-TRIMETHOPRIM 800-160 MG PO TABS: 1.0000 | ORAL_TABLET | Freq: Two times a day (BID) | ORAL | Status: DC

## 2013-06-06 NOTE — Patient Instructions (Addendum)
Return 2 weeks   Continue antibiotics

## 2013-06-06 NOTE — Progress Notes (Signed)
Patient ID: Carol Rosario, female   DOB: [birth year], 72 y.o.   MRN: [id number] Wound check right hip status post right hip replacement  Currently on Bactrim topical antibiotic cream. Her wound looks much better she just has a small 2 cm area of redness. She will continue antibiotics and follow up in 2 weeks

## 2013-06-21 ENCOUNTER — Ambulatory Visit (INDEPENDENT_AMBULATORY_CARE_PROVIDER_SITE_OTHER): Payer: Medicare Other | Admitting: Orthopedic Surgery

## 2013-06-21 VITALS — BP 98/60 | Ht 64.0 in | Wt 158.0 lb

## 2013-06-21 DIAGNOSIS — Z96649 Presence of unspecified artificial hip joint: Secondary | ICD-10-CM

## 2013-06-21 MED ORDER — OXYCODONE-ACETAMINOPHEN 5-325 MG PO TABS: 1.0000 | ORAL_TABLET | ORAL | Status: DC

## 2013-06-21 NOTE — Patient Instructions (Signed)
activities as tolerated 

## 2013-06-21 NOTE — Progress Notes (Signed)
Patient ID: Carol Rosario, female   DOB: 1941-04-26, 72 y.o.   MRN: [id number] Chief Complaint  Patient presents with  . Follow-up    2 week recheck wound right hip DOS 05/02/13   Check weeks 6 at postop wound drainage treated with Bactrim doing well wound looks clean dry closed return 6 weeks refill Percocet next prescription change medication

## 2013-08-02 ENCOUNTER — Ambulatory Visit (INDEPENDENT_AMBULATORY_CARE_PROVIDER_SITE_OTHER): Payer: Medicare Other | Admitting: Orthopedic Surgery

## 2013-08-02 ENCOUNTER — Encounter: Payer: Self-pay | Admitting: Orthopedic Surgery

## 2013-08-02 VITALS — BP 118/70 | Ht 64.0 in | Wt 158.0 lb

## 2013-08-02 DIAGNOSIS — Z96649 Presence of unspecified artificial hip joint: Secondary | ICD-10-CM

## 2013-08-02 MED ORDER — METHOCARBAMOL 500 MG PO TABS: 500.0000 mg | ORAL_TABLET | Freq: Four times a day (QID) | ORAL | Status: DC | PRN

## 2013-08-02 NOTE — Progress Notes (Signed)
Patient ID: Carol Rosario, female   DOB: 1941-07-17, 72 y.o.   MRN: [id number] BP 118/70  Ht 5\' 4"  (1.626 m)  Wt 158 lb (71.668 kg)  BMI 27.11 kg/m2 Chief Complaint  Patient presents with  . Follow-up    6 week recheck Right Hip THA DOS 05/02/13   Encounter Diagnosis  Name Primary?  . S/P total hip arthroplasty Yes    3 months status post total hip  No complaints of pain at this point cane uses occasional  Hip flexion is normal wound healing complete  Leg lengths are equal  Followup in 3 months

## 2013-11-03 ENCOUNTER — Ambulatory Visit (INDEPENDENT_AMBULATORY_CARE_PROVIDER_SITE_OTHER): Payer: Medicare Other | Admitting: Orthopedic Surgery

## 2013-11-03 VITALS — BP 122/74 | Ht 64.0 in | Wt 152.0 lb

## 2013-11-03 DIAGNOSIS — Z96649 Presence of unspecified artificial hip joint: Secondary | ICD-10-CM

## 2013-11-03 MED ORDER — METHOCARBAMOL 500 MG PO TABS: 500.0000 mg | ORAL_TABLET | Freq: Four times a day (QID) | ORAL | Status: DC | PRN

## 2013-11-03 NOTE — Progress Notes (Signed)
Patient ID: Carol Rosario, female   DOB: Feb 10, 1941, 72 y.o.   MRN: [id number]  Chief Complaint  Patient presents with  . Follow-up    3 month recheck on right THA. DOS 05-02-13.    Status post right total hip 6 months ago when well. Her only complaint is that she has to use a bump under her right hip at night missing a pillow and take a muscle relaxer  No other pain and bleeding without device  System review negative  General appearance is normal, the patient is alert and oriented x3 with normal mood and affect. BP 122/74  Ht 5\' 4"  (1.626 m)  Wt 152 lb (68.947 kg)  BMI 26.08 kg/m2 No tenderness over the greater trochanter leg lengths equal gait pattern normal  Status post total hip doing well followup 6 months for first annual total hip x-ray

## 2013-11-03 NOTE — Patient Instructions (Signed)
Pick up your prescription at pharmacy 

## 2013-12-23 ENCOUNTER — Ambulatory Visit (INDEPENDENT_AMBULATORY_CARE_PROVIDER_SITE_OTHER): Payer: Medicare Other | Admitting: Adult Health

## 2013-12-23 ENCOUNTER — Encounter: Payer: Self-pay | Admitting: Adult Health

## 2013-12-23 VITALS — BP 104/72 | HR 85 | Ht 62.0 in | Wt 152.0 lb

## 2013-12-23 DIAGNOSIS — J111 Influenza due to unidentified influenza virus with other respiratory manifestations: Secondary | ICD-10-CM

## 2013-12-23 MED ORDER — DEXTROMETHORPHAN-GUAIFENESIN 10-100 MG/5ML PO SYRP: 5.0000 mL | ORAL_SOLUTION | Freq: Two times a day (BID) | ORAL | Status: DC

## 2013-12-23 MED ORDER — OSELTAMIVIR PHOSPHATE 75 MG PO CAPS
75.0000 mg | ORAL_CAPSULE | Freq: Two times a day (BID) | ORAL | Status: DC
Start: 2013-12-23 — End: 2017-03-19

## 2013-12-23 NOTE — Assessment & Plan Note (Signed)
Temp of 102.2  She appears to have symptoms of the flu. I will start her on Tamiflu and Robitussion She will see PCP for further assessment. I will have her rescheduled.

## 2013-12-23 NOTE — Patient Instructions (Signed)
Your physician recommends that you schedule a follow-up appointment in: 1 month  Your physician has recommended you make the following change in your medication:   1. Tamiflu 75 mg twice a day for 5 days 2. Robitussin DM 3mL every 12 hours  Please see Primary care physician soon.

## 2013-12-23 NOTE — Progress Notes (Deleted)
Name: Carol Rosario    DOB: 16-Feb-1941  Age: 73 y.o.  MR#: [id number]       PCP:  Glo Herring., MD      Insurance: Payor: [employer] MEDICARE / Plan: AARP MEDICARE COMPLETE / Product Type: *No Product type* /   CC:    Chief Complaint  Patient presents with  . Hypertension    VS Filed Vitals:   12/23/13 1409  BP: 104/72  Pulse: 85  Height: 5\' 2"  (1.575 m)  Weight: 152 lb (68.947 kg)    Weights Current Weight  12/23/13 152 lb (68.947 kg)  11/03/13 152 lb (68.947 kg)  08/02/13 158 lb (71.668 kg)    Blood Pressure  BP Readings from Last 3 Encounters:  12/23/13 104/72  11/03/13 122/74  08/02/13 118/70     Admit date:  (Not on file) Last encounter with RMR:  Visit date not found   Allergy Codeine  Current Outpatient Prescriptions  Medication Sig Dispense Refill  . ALPRAZolam (XANAX) 0.5 MG tablet Take 0.5 mg by mouth at bedtime as needed. Sleep      . aspirin EC 325 MG EC tablet Take 1 tablet (325 mg total) by mouth 2 (two) times daily.  30 tablet  0  . fish oil-omega-3 fatty acids 1000 MG capsule Take 2 g by mouth daily.      Marland Kitchen gabapentin (NEURONTIN) 100 MG capsule Take 1 capsule (100 mg total) by mouth 3 (three) times daily.  90 capsule  2  . levothyroxine (SYNTHROID, LEVOTHROID) 50 MCG tablet Take 50 mcg by mouth daily.      Marland Kitchen lisinopril-hydrochlorothiazide (PRINZIDE,ZESTORETIC) 20-12.5 MG per tablet Take 1 tablet by mouth daily.      . methocarbamol (ROBAXIN) 500 MG tablet Take 1 tablet (500 mg total) by mouth every 6 (six) hours as needed.  60 tablet  1  . oxyCODONE-acetaminophen (PERCOCET/ROXICET) 5-325 MG per tablet Take 1 tablet by mouth every 4 (four) hours.  70 tablet  0  . Probiotic Product ([place]) CAPS Take 1 capsule by mouth every other day.      Marland Kitchen ZOCOR 40 MG tablet TAKE ONE TABLET BY MOUTH AT BEDTIME.  90 tablet  2   No current facility-administered medications for this visit.    Discontinued Meds:    Medications Discontinued  During This Encounter  Medication Reason  . sulfamethoxazole-trimethoprim (BACTRIM DS,SEPTRA DS) 800-160 MG per tablet Error  . PRINIVIL 20 MG tablet Error    Patient Active Problem List   Diagnosis Date Noted  . S/P total hip arthroplasty 05/16/2013  . Degenerative arthritis of hip 05/02/2013  . Radicular leg pain 03/15/2013  . Back pain 03/15/2013  . Arthritis pain of hip 03/15/2013  . Anemia 08/09/2012  . Heme positive stool 08/09/2012  . Hypertension 05/18/2012  . Osteoarthritis of hip 10/23/2011  . Hip pain 10/23/2011  . Arthritis of knee, degenerative 09/09/2011  . TOTAL HIP FOLLOW-UP 08/27/2010  . HIP, ARTHRITIS, DEGEN./OSTEO 07/29/2010  . OBESITY 01/16/2010  . DENTAL CARIES 01/16/2010  . SYNCOPE, HX OF 01/16/2010  . 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 SCIATICA 04/17/2008    LABS    Component Value Date/Time   NA 137 05/03/2013 0619   NA 138 04/27/2013 1030   NA 137 09/16/2012 0552   K 5.0 05/03/2013 0619   K 4.9 04/27/2013 1030   K 4.0 09/16/2012 0552   CL 103 05/03/2013 0619   CL 101 04/27/2013 1030   CL  101 09/16/2012 0552   CO2 30 05/03/2013 0619   CO2 30 04/27/2013 1030   CO2 31 09/16/2012 0552   GLUCOSE 173* 05/03/2013 0619   GLUCOSE 87 04/27/2013 1030   GLUCOSE 86 09/16/2012 0552   BUN 22 05/03/2013 0619   BUN 24* 04/27/2013 1030   BUN 6 09/16/2012 0552   CREATININE 1.28* 05/03/2013 0619   CREATININE 1.21* 05/02/2013 1246   CREATININE 1.36* 04/27/2013 1030   CREATININE 1.35* 05/24/2012 0951   CALCIUM 8.4 05/03/2013 0619   CALCIUM 9.8 04/27/2013 1030   CALCIUM 9.4 09/16/2012 0552   GFRNONAA 41* 05/03/2013 0619   GFRNONAA 44* 05/02/2013 1246   GFRNONAA 38* 04/27/2013 1030   GFRAA 48* 05/03/2013 0619   GFRAA 51* 05/02/2013 1246   GFRAA 44* 04/27/2013 1030   CMP     Component Value Date/Time   NA 137 05/03/2013 0619   K 5.0 05/03/2013 0619   CL 103 05/03/2013 0619   CO2 30 05/03/2013 0619   GLUCOSE 173* 05/03/2013 0619   BUN 22 05/03/2013 0619    CREATININE 1.28* 05/03/2013 0619   CREATININE 1.35* 05/24/2012 0951   CALCIUM 8.4 05/03/2013 0619   PROT 6.8 09/09/2012 1440   ALBUMIN 3.9 09/09/2012 1440   AST 16 09/09/2012 1440   ALT 10 09/09/2012 1440   ALKPHOS 69 09/09/2012 1440   BILITOT 0.3 09/09/2012 1440   GFRNONAA 41* 05/03/2013 0619   GFRAA 48* 05/03/2013 0619       Component Value Date/Time   WBC 7.9 05/05/2013 0608   WBC 8.1 05/04/2013 0610   WBC 9.6 05/03/2013 0619   HGB 10.1* 05/05/2013 0608   HGB 10.4* 05/04/2013 0610   HGB 8.6* 05/03/2013 0619   HCT 30.8* 05/05/2013 0608   HCT 31.3* 05/04/2013 0610   HCT 26.6* 05/03/2013 0619   MCV 91.9 05/05/2013 0608   MCV 90.7 05/04/2013 0610   MCV 90.5 05/03/2013 0619    Lipid Panel     Component Value Date/Time   CHOL 243* 05/24/2012 0951   TRIG 288* 05/24/2012 0951   HDL 39* 05/24/2012 0951   CHOLHDL 6.2 05/24/2012 0951   VLDL 58* 05/24/2012 0951   LDLCALC 146* 05/24/2012 0951    ABG No results found for this basename: phart, pco2, pco2art, po2, po2art, hco3, tco2, acidbasedef, o2sat     Lab Results  Component Value Date   TSH 7.606* 01/21/2010   BNP (last 3 results) No results found for this basename: PROBNP,  in the last 8760 hours Cardiac Panel (last 3 results) No results found for this basename: CKTOTAL, CKMB, TROPONINI, RELINDX,  in the last 72 hours  Iron/TIBC/Ferritin    Component Value Date/Time   IRON 30* 07/30/2012 0815   FERRITIN 4* 07/30/2012 0815     EKG Orders placed during the hospital encounter of 05/02/13  . EKG 12-LEAD  . EKG 12-LEAD     Prior Assessment and Plan Problem List as of 12/23/2013   OBESITY   DENTAL CARIES   HIP, ARTHRITIS, DEGEN./OSTEO   DEGENERATIVE DISC DISEASE, LUMBAR SPINE   SCIATICA   SYNCOPE, HX OF   TOTAL HIP FOLLOW-UP   Arthritis of knee, degenerative   Osteoarthritis of hip   Hip pain   Hypertension   Last Assessment & Plan   05/18/2012 Office Visit Written 05/18/2012  1:44 PM by Lendon Colonel, NP     Excellent control of BP,  actually on low side. With complaints of leg cramps, I will d/c HCTZ portion of  zestoretic and continue just zestoril 20 mg only. I will have CMET completed along with CBC. She will also have lipids completed as they have not been checked this year. Hopefully she will have less complaint of leg cramping without use of diuretic. I will consider adding magnesium to her regimen if labs are ok.     Anemia   Last Assessment & Plan   07/29/2012 Office Visit Written 08/09/2012  4:38 PM by Orvil Feil, NP     CBC, iron, ferritin. TCS and possible EGD as planned. If continued anemia, evidence of iron deficiency and no source from lower or upper evaluation, consider capsule study.     Heme positive stool   Last Assessment & Plan   07/29/2012 Office Visit Written 08/09/2012  4:37 PM by Orvil Feil, NP     72 year old female with heme + stool through PCP's office. No upper or lower GI symptoms currently. Notes approximately 30 lbs wt loss of past several years, unintentional. Mild normocytic anemia noted on recent labs as above. Will update CBC, iron, ferritin. No prior colonoscopy. Proceed with TCS+/- EGD in near future.   Proceed with colonoscopy, possible upper endoscopy with Dr. Oneida Alar in the near future. The risks, benefits, and alternatives have been discussed in detail with the patient. They state understanding and desire to proceed.      Radicular leg pain   Back pain   Arthritis pain of hip   Degenerative arthritis of hip   S/P total hip arthroplasty       Imaging: No results found.

## 2013-12-23 NOTE — Progress Notes (Signed)
HPI: Carol Rosario is a 73 year old former patient of Dr. Lattie Haw we have not seen for 18 months that we were following for hypertension and hypercholesterolemia. The last office visit in June of 2013 she was without complaint.   At that time her husband had recently been placed in hospice. No changes were made in her medication regimen. She was to have some followup labs to check lipid panel and liver enzymes.    She come today with complaints of cough and congestion, with fever of 102.0.Saw PCP yesterday and got a "shot". Feels worse with congestion in her chest.    Allergies  Allergen Reactions  . Codeine Shortness Of Breath and Itching    Current Outpatient Prescriptions  Medication Sig Dispense Refill  . ALPRAZolam (XANAX) 0.5 MG tablet Take 0.5 mg by mouth at bedtime as needed. Sleep      . aspirin EC 325 MG EC tablet Take 1 tablet (325 mg total) by mouth 2 (two) times daily.  30 tablet  0  . fish oil-omega-3 fatty acids 1000 MG capsule Take 2 g by mouth daily.      Marland Kitchen gabapentin (NEURONTIN) 100 MG capsule Take 1 capsule (100 mg total) by mouth 3 (three) times daily.  90 capsule  2  . levothyroxine (SYNTHROID, LEVOTHROID) 50 MCG tablet Take 50 mcg by mouth daily.      Marland Kitchen lisinopril-hydrochlorothiazide (PRINZIDE,ZESTORETIC) 20-12.5 MG per tablet Take 1 tablet by mouth daily.      . methocarbamol (ROBAXIN) 500 MG tablet Take 1 tablet (500 mg total) by mouth every 6 (six) hours as needed.  60 tablet  1  . oxyCODONE-acetaminophen (PERCOCET/ROXICET) 5-325 MG per tablet Take 1 tablet by mouth every 4 (four) hours.  70 tablet  0  . Probiotic Product ([place]) CAPS Take 1 capsule by mouth every other day.      Marland Kitchen ZOCOR 40 MG tablet TAKE ONE TABLET BY MOUTH AT BEDTIME.  90 tablet  2   No current facility-administered medications for this visit.    Past Medical History  Diagnosis Date  . Hypertension   . Hyperlipidemia   . Palpitations   . Family history of tobacco abuse and  dependence   . Obesity   . DJD (degenerative joint disease)   . Degeneration of lumbar or lumbosacral intervertebral disc   . Sciatica   . History of recurrent UTIs   . PONV (postoperative nausea and vomiting)   . Anxiety   . Hypothyroidism   . Cancer 08/2012    colon cancer    Past Surgical History  Procedure Laterality Date  . Cataract extraction, bilateral    . Cholecystectomy    . Bilateral tubal ligation    . Vesicovaginal fistula closure w/ tah    . Bladder resuspension    . Hip surgery  2011    left, [facility]  . Knee surgery      left arthroscopy  . Flexible sigmoidoscopy  08/26/2012    Procedure: FLEXIBLE SIGMOIDOSCOPY;  Surgeon: Daneil Dolin, MD;  Location: [facility];  Service: Endoscopy;  Laterality: N/A;  . Partial colectomy  09/13/2012    Procedure: PARTIAL COLECTOMY;  Surgeon: Jamesetta So, MD;  Location: [facility];  Service: General;  Laterality: N/A;  . Tubal ligation    . Total hip arthroplasty Right 05/02/2013    Procedure: TOTAL HIP ARTHROPLASTY;  Surgeon: Carole Civil, MD;  Location: [facility];  Service: Orthopedics;  Laterality: Right;  Right  Total Hip Arthroplasty    ROS:  Review of systems complete and found to be negative unless listed above  PHYSICAL EXAM BP 104/72  Pulse 85  Ht 5\' 2"  (1.575 m)  Wt 152 lb (68.947 kg)  BMI 27.79 kg/m2  General: Well developed, well nourished, in no acute distress Head: Eyes PERRLA, No xanthomas.   Normal cephalic and atramatic  Lungs: Bilateral crackles. Some wheezes.  Heart: HRRR S1 S2, without MRG.  Pulses are 2+ & equal.            No carotid bruit. No JVD.  No abdominal bruits. No femoral bruits. Abdomen: Bowel sounds are positive, abdomen soft and non-tender without masses or                  Hernia's noted. Msk:  Back normal, normal gait. Normal strength and tone for age. Extremities: No clubbing, cyanosis or edema.  DP +1 Neuro: Alert and oriented X 3. Psych:  Good affect, responds  appropriately    ASSESSMENT AND PLAN

## 2014-01-03 ENCOUNTER — Ambulatory Visit: Payer: Medicare Other | Admitting: Cardiology

## 2014-01-31 ENCOUNTER — Ambulatory Visit: Payer: Medicare Other | Admitting: Cardiovascular Disease

## 2014-02-08 ENCOUNTER — Ambulatory Visit (INDEPENDENT_AMBULATORY_CARE_PROVIDER_SITE_OTHER): Payer: Medicare Other | Admitting: Cardiovascular Disease

## 2014-02-08 ENCOUNTER — Encounter: Payer: Self-pay | Admitting: Cardiovascular Disease

## 2014-02-08 VITALS — BP 106/62 | HR 97 | Ht 59.0 in | Wt 149.0 lb

## 2014-02-08 DIAGNOSIS — Z9189 Other specified personal risk factors, not elsewhere classified: Secondary | ICD-10-CM

## 2014-02-08 DIAGNOSIS — Z87898 Personal history of other specified conditions: Secondary | ICD-10-CM

## 2014-02-08 DIAGNOSIS — E782 Mixed hyperlipidemia: Secondary | ICD-10-CM

## 2014-02-08 DIAGNOSIS — I10 Essential (primary) hypertension: Secondary | ICD-10-CM

## 2014-02-08 NOTE — Patient Instructions (Signed)
Your physician recommends that you schedule a follow-up appointment in:  As needed  Your physician recommends that you continue on your current medications as directed. Please refer to the Current Medication list given to you today.  

## 2014-02-08 NOTE — Progress Notes (Signed)
Patient ID: KATRECE ROEDIGER, female   DOB: January 16, 1941, 73 y.o.   MRN: [id number]      SUBJECTIVE: The patient is a 73 yr old woman who I am meeting for the first time, who reportedly has a h/o HTN, hyperlipidemia, and a remote h/o syncope in 2008. She has no known CAD. She tells me she was initially referred to a cardiologist as her "heart was skipping beats".  She said her heart "skips a beat" every now and then, but denies any associated dizziness, shortness of breath, or chest pain. She sees her PCP, Dr. Gerarda Fraction, for management of HTN and hyperlipidemia.    Allergies  Allergen Reactions  . Codeine Shortness Of Breath and Itching    Current Outpatient Prescriptions  Medication Sig Dispense Refill  . ALPRAZolam (XANAX) 0.5 MG tablet Take 0.5 mg by mouth at bedtime as needed. Sleep      . aspirin EC 325 MG EC tablet Take 1 tablet (325 mg total) by mouth 2 (two) times daily.  30 tablet  0  . Dextromethorphan-Guaifenesin (ROBITUSSIN DM) 10-100 MG/5ML liquid Take 5 mLs by mouth every 12 (twelve) hours.  120 mL  0  . fish oil-omega-3 fatty acids 1000 MG capsule Take 2 g by mouth daily.      Marland Kitchen gabapentin (NEURONTIN) 100 MG capsule Take 1 capsule (100 mg total) by mouth 3 (three) times daily.  90 capsule  2  . levothyroxine (SYNTHROID, LEVOTHROID) 50 MCG tablet Take 50 mcg by mouth daily.      Marland Kitchen lisinopril-hydrochlorothiazide (PRINZIDE,ZESTORETIC) 20-12.5 MG per tablet Take 1 tablet by mouth daily.      . methocarbamol (ROBAXIN) 500 MG tablet Take 1 tablet (500 mg total) by mouth every 6 (six) hours as needed.  60 tablet  1  . oseltamivir (TAMIFLU) 75 MG capsule Take 1 capsule (75 mg total) by mouth 2 (two) times daily. For 5 days  10 capsule  0  . oxyCODONE-acetaminophen (PERCOCET/ROXICET) 5-325 MG per tablet Take 1 tablet by mouth every 4 (four) hours.  70 tablet  0  . Probiotic Product ([place]) CAPS Take 1 capsule by mouth every other day.      Marland Kitchen ZOCOR 40 MG tablet TAKE ONE  TABLET BY MOUTH AT BEDTIME.  90 tablet  2   No current facility-administered medications for this visit.    Past Medical History  Diagnosis Date  . Hypertension   . Hyperlipidemia   . Palpitations   . Family history of tobacco abuse and dependence   . Obesity   . DJD (degenerative joint disease)   . Degeneration of lumbar or lumbosacral intervertebral disc   . Sciatica   . History of recurrent UTIs   . PONV (postoperative nausea and vomiting)   . Anxiety   . Hypothyroidism   . Cancer 08/2012    colon cancer    Past Surgical History  Procedure Laterality Date  . Cataract extraction, bilateral    . Cholecystectomy    . Bilateral tubal ligation    . Vesicovaginal fistula closure w/ tah    . Bladder resuspension    . Hip surgery  2011    left, [facility]  . Knee surgery      left arthroscopy  . Flexible sigmoidoscopy  08/26/2012    Procedure: FLEXIBLE SIGMOIDOSCOPY;  Surgeon: Daneil Dolin, MD;  Location: [facility];  Service: Endoscopy;  Laterality: N/A;  . Partial colectomy  09/13/2012    Procedure: PARTIAL COLECTOMY;  Surgeon: Jamesetta So, MD;  Location: [facility];  Service: General;  Laterality: N/A;  . Tubal ligation    . Total hip arthroplasty Right 05/02/2013    Procedure: TOTAL HIP ARTHROPLASTY;  Surgeon: Carole Civil, MD;  Location: [facility];  Service: Orthopedics;  Laterality: Right;  Right Total Hip Arthroplasty    History   Social History  . Marital Status: Widowed    Spouse Name: N/A    Number of Children: N/A  . Years of Education: N/A   Occupational History  . Not on file.   Social History Main Topics  . Smoking status: Former Smoker    Types: Cigarettes    Quit date: 08/13/2010  . Smokeless tobacco: Not on file  . Alcohol Use: No  . Drug Use: No  . Sexual Activity: Yes    Birth Control/ Protection: Surgical   Other Topics Concern  . Not on file   Social History Narrative  . No narrative on file     Filed Vitals:   02/08/14 1400  BP:  106/62  Pulse: 97  Height: 4\' 11"  (1.499 m)  Weight: 149 lb (67.586 kg)  SpO2: 98%    PHYSICAL EXAM General: NAD Neck: No JVD, no thyromegaly. Lungs: Clear to auscultation bilaterally with normal respiratory effort. CV: Nondisplaced PMI.  Regular rate and rhythm, normal S1/S2, no S3/S4, no murmur. No pretibial or periankle edema.  No carotid bruit.  Normal pedal pulses.  Abdomen: Soft, nontender, no hepatosplenomegaly, no distention.  Neurologic: Alert and oriented x 3.  Psych: Normal affect. Extremities: No clubbing or cyanosis.   ECG: reviewed and available in electronic records.      ASSESSMENT AND PLAN: 1. HTN: well controlled on current therapy. 2. Hyperlipidemia: I don't have recent lipid results, but she appears to be maintained on simvastatin 40 mg daily and fish oil. 3. Remote h/o syncope: no further recurrences.  Dispo: Given the fact that she has no evidence of CAD or arrhythmias, I feel her chronic medical problems of HTN and hyperlipidemia can continue to be managed by Dr. Gerarda Fraction, her PCP. I did tell her that should she experience any cardiac-related symptoms, I would be happy to see her. She can f/u prn with me.  Kate Sable, M.D., F.A.C.C.

## 2014-05-04 ENCOUNTER — Ambulatory Visit (INDEPENDENT_AMBULATORY_CARE_PROVIDER_SITE_OTHER): Payer: Medicare Other | Admitting: Orthopedic Surgery

## 2014-05-04 ENCOUNTER — Ambulatory Visit (INDEPENDENT_AMBULATORY_CARE_PROVIDER_SITE_OTHER): Payer: Medicare Other

## 2014-05-04 VITALS — BP 113/65 | Ht 62.0 in | Wt 149.0 lb

## 2014-05-04 DIAGNOSIS — Z96649 Presence of unspecified artificial hip joint: Secondary | ICD-10-CM

## 2014-05-04 DIAGNOSIS — M169 Osteoarthritis of hip, unspecified: Secondary | ICD-10-CM

## 2014-05-04 DIAGNOSIS — M1610 Unilateral primary osteoarthritis, unspecified hip: Secondary | ICD-10-CM

## 2014-05-04 NOTE — Progress Notes (Signed)
Patient ID: Carol Rosario, female   DOB: 1941-11-05, 73 y.o.   MRN: [id number]  annual followup visit  History s/p right total hip   Doing very well review of systems negative  VS: BP 113/65  Ht 5\' 2"  (1.575 m)  Wt 149 lb (67.586 kg)  BMI 27.25 kg/m2  General appearance is normal, the patient is alert and oriented x3 with normal mood and affect. Gait unsupported without a limp   LL equal   Hip flexion normal   DX s/p right THA  Assess: stable well functioning total hip   Plan: annual xrays in 1 year

## 2014-05-05 ENCOUNTER — Other Ambulatory Visit (HOSPITAL_COMMUNITY): Payer: Self-pay | Admitting: Internal Medicine

## 2014-05-05 DIAGNOSIS — Z1231 Encounter for screening mammogram for malignant neoplasm of breast: Secondary | ICD-10-CM

## 2014-05-12 ENCOUNTER — Ambulatory Visit (HOSPITAL_COMMUNITY)
Admission: RE | Admit: 2014-05-12 | Discharge: 2014-05-12 | Disposition: A | Discharge status: Home / Self Care | Payer: Medicare Other | Source: Ambulatory Visit | Attending: Internal Medicine | Admitting: Internal Medicine

## 2014-05-12 DIAGNOSIS — Z1231 Encounter for screening mammogram for malignant neoplasm of breast: Secondary | ICD-10-CM | POA: Insufficient documentation

## 2014-12-19 ENCOUNTER — Ambulatory Visit (HOSPITAL_COMMUNITY)
Admission: RE | Admit: 2014-12-19 | Discharge: 2014-12-19 | Disposition: A | Discharge status: Home / Self Care | Payer: Medicare Other | Source: Ambulatory Visit | Attending: Physician Assistant | Admitting: Physician Assistant

## 2014-12-19 ENCOUNTER — Other Ambulatory Visit (HOSPITAL_COMMUNITY): Payer: Self-pay | Admitting: Physician Assistant

## 2014-12-19 DIAGNOSIS — M954 Acquired deformity of chest and rib: Secondary | ICD-10-CM

## 2014-12-19 DIAGNOSIS — R222 Localized swelling, mass and lump, trunk: Secondary | ICD-10-CM | POA: Insufficient documentation

## 2014-12-19 DIAGNOSIS — M19011 Primary osteoarthritis, right shoulder: Secondary | ICD-10-CM | POA: Diagnosis not present

## 2014-12-19 DIAGNOSIS — M19019 Primary osteoarthritis, unspecified shoulder: Secondary | ICD-10-CM | POA: Diagnosis not present

## 2015-01-17 DIAGNOSIS — G894 Chronic pain syndrome: Secondary | ICD-10-CM | POA: Diagnosis not present

## 2015-02-15 DIAGNOSIS — G894 Chronic pain syndrome: Secondary | ICD-10-CM | POA: Diagnosis not present

## 2015-04-09 DIAGNOSIS — G894 Chronic pain syndrome: Secondary | ICD-10-CM | POA: Diagnosis not present

## 2015-04-09 DIAGNOSIS — E782 Mixed hyperlipidemia: Secondary | ICD-10-CM | POA: Diagnosis not present

## 2015-04-09 DIAGNOSIS — Z6837 Body mass index (BMI) 37.0-37.9, adult: Secondary | ICD-10-CM | POA: Diagnosis not present

## 2015-04-09 DIAGNOSIS — I10 Essential (primary) hypertension: Secondary | ICD-10-CM | POA: Diagnosis not present

## 2015-04-10 DIAGNOSIS — E063 Autoimmune thyroiditis: Secondary | ICD-10-CM | POA: Diagnosis not present

## 2015-04-10 DIAGNOSIS — G894 Chronic pain syndrome: Secondary | ICD-10-CM | POA: Diagnosis not present

## 2015-04-10 DIAGNOSIS — I10 Essential (primary) hypertension: Secondary | ICD-10-CM | POA: Diagnosis not present

## 2015-04-10 DIAGNOSIS — E782 Mixed hyperlipidemia: Secondary | ICD-10-CM | POA: Diagnosis not present

## 2015-05-10 ENCOUNTER — Encounter: Payer: Self-pay | Admitting: Orthopedic Surgery

## 2015-05-10 ENCOUNTER — Ambulatory Visit (INDEPENDENT_AMBULATORY_CARE_PROVIDER_SITE_OTHER): Payer: Medicare Other

## 2015-05-10 ENCOUNTER — Ambulatory Visit (INDEPENDENT_AMBULATORY_CARE_PROVIDER_SITE_OTHER): Payer: Medicare Other | Admitting: Orthopedic Surgery

## 2015-05-10 VITALS — BP 119/61 | Ht 64.0 in | Wt 162.0 lb

## 2015-05-10 DIAGNOSIS — Z9660 Presence of unspecified orthopedic joint implant: Secondary | ICD-10-CM | POA: Diagnosis not present

## 2015-05-10 DIAGNOSIS — M1990 Unspecified osteoarthritis, unspecified site: Secondary | ICD-10-CM

## 2015-05-10 DIAGNOSIS — M1610 Unilateral primary osteoarthritis, unspecified hip: Secondary | ICD-10-CM

## 2015-05-10 DIAGNOSIS — Z96649 Presence of unspecified artificial hip joint: Secondary | ICD-10-CM

## 2015-05-10 NOTE — Progress Notes (Signed)
Patient ID: Carol Rosario, female   DOB: [birth year], 74 y.o.   MRN: [id number]  Chief Complaint  Patient presents with  . Follow-up    Yearly recheck on right total hip replacement, DOS 05-02-13.    HPI Carol Rosario is a 74 y.o. female.  Presents for her routine follow-up annual visit complaining primarily of back pain Status post RIGH total hip arthroplasty. The patient is doing well the hip implant is functioning well.  Review of Systems Review of Systems Back pain  Past Medical History  Diagnosis Date  . Hypertension   . Hyperlipidemia   . Palpitations   . Family history of tobacco abuse and dependence   . Obesity   . DJD (degenerative joint disease)   . Degeneration of lumbar or lumbosacral intervertebral disc   . Sciatica   . History of recurrent UTIs   . PONV (postoperative nausea and vomiting)   . Anxiety   . Hypothyroidism   . Cancer 08/2012    colon cancer    Past Surgical History  Procedure Laterality Date  . Cataract extraction, bilateral    . Cholecystectomy    . Bilateral tubal ligation    . Vesicovaginal fistula closure w/ tah    . Bladder resuspension    . Hip surgery  2011    left, [facility]  . Knee surgery      left arthroscopy  . Flexible sigmoidoscopy  08/26/2012    Procedure: FLEXIBLE SIGMOIDOSCOPY;  Surgeon: Daneil Dolin, MD;  Location: [facility];  Service: Endoscopy;  Laterality: N/A;  . Partial colectomy  09/13/2012    Procedure: PARTIAL COLECTOMY;  Surgeon: Jamesetta So, MD;  Location: [facility];  Service: General;  Laterality: N/A;  . Tubal ligation    . Total hip arthroplasty Right 05/02/2013    Procedure: TOTAL HIP ARTHROPLASTY;  Surgeon: Carole Civil, MD;  Location: [facility];  Service: Orthopedics;  Laterality: Right;  Right Total Hip Arthroplasty     Allergies  Allergen Reactions  . Codeine Shortness Of Breath and Itching    Current Outpatient Prescriptions  Medication Sig Dispense Refill  . ALPRAZolam (XANAX) 0.5 MG tablet Take  0.5 mg by mouth at bedtime as needed. Sleep    . aspirin EC 325 MG EC tablet Take 1 tablet (325 mg total) by mouth 2 (two) times daily. 30 tablet 0  . Dextromethorphan-Guaifenesin (ROBITUSSIN DM) 10-100 MG/5ML liquid Take 5 mLs by mouth every 12 (twelve) hours. 120 mL 0  . fish oil-omega-3 fatty acids 1000 MG capsule Take 2 g by mouth daily.    Marland Kitchen gabapentin (NEURONTIN) 100 MG capsule Take 1 capsule (100 mg total) by mouth 3 (three) times daily. 90 capsule 2  . levothyroxine (SYNTHROID, LEVOTHROID) 50 MCG tablet Take 50 mcg by mouth daily.    Marland Kitchen lisinopril-hydrochlorothiazide (PRINZIDE,ZESTORETIC) 20-12.5 MG per tablet Take 1 tablet by mouth daily.    . methocarbamol (ROBAXIN) 500 MG tablet Take 1 tablet (500 mg total) by mouth every 6 (six) hours as needed. 60 tablet 1  . oseltamivir (TAMIFLU) 75 MG capsule Take 1 capsule (75 mg total) by mouth 2 (two) times daily. For 5 days 10 capsule 0  . oxyCODONE-acetaminophen (PERCOCET/ROXICET) 5-325 MG per tablet Take 1 tablet by mouth every 4 (four) hours. 70 tablet 0  . Probiotic Product ([place]) CAPS Take 1 capsule by mouth every other day.    Marland Kitchen ZOCOR 40 MG tablet TAKE ONE TABLET BY MOUTH AT BEDTIME.  90 tablet 2   No current facility-administered medications for this visit.      Physical Exam Blood pressure 119/61, height 5\' 4"  (1.626 m), weight 162 lb (73.483 kg).  Overall appearance normal grooming and hygiene. The patient is awake alert and oriented 3 with a pleasant mood and affect. The patient is ambulatory without assistive device and without limping.  . Skin incision is clean dry and intact. Muscle tone is normal and the hip is stable. Flexion is 120 bilaterally. Leg lengths are equal.  Data Reviewed Today's imaging shows stable total hip implant without loosening or complication  Assessment    Encounter Diagnoses  Name Primary?  . Hip arthritis   . S/P hip replacement Yes   Stable total hip     Plan     Follow-up in a year x-ray both at the same time       Carol Rosario 05/10/2015, 10:45 AM

## 2015-05-15 NOTE — Addendum Note (Signed)
Addended by: Baldomero Lamy B on: 05/15/2015 12:07 PM   Modules accepted: Medications

## 2015-05-25 DIAGNOSIS — E039 Hypothyroidism, unspecified: Secondary | ICD-10-CM | POA: Diagnosis not present

## 2015-05-25 DIAGNOSIS — Z6836 Body mass index (BMI) 36.0-36.9, adult: Secondary | ICD-10-CM | POA: Diagnosis not present

## 2015-05-25 DIAGNOSIS — N183 Chronic kidney disease, stage 3 (moderate): Secondary | ICD-10-CM | POA: Diagnosis not present

## 2015-05-25 DIAGNOSIS — E6609 Other obesity due to excess calories: Secondary | ICD-10-CM | POA: Diagnosis not present

## 2015-05-25 DIAGNOSIS — G894 Chronic pain syndrome: Secondary | ICD-10-CM | POA: Diagnosis not present

## 2015-06-13 DIAGNOSIS — E669 Obesity, unspecified: Secondary | ICD-10-CM | POA: Diagnosis not present

## 2015-06-13 DIAGNOSIS — I10 Essential (primary) hypertension: Secondary | ICD-10-CM | POA: Diagnosis not present

## 2015-06-13 DIAGNOSIS — N183 Chronic kidney disease, stage 3 (moderate): Secondary | ICD-10-CM | POA: Diagnosis not present

## 2015-06-27 ENCOUNTER — Other Ambulatory Visit (HOSPITAL_COMMUNITY): Payer: Self-pay | Admitting: Nephrology

## 2015-06-27 DIAGNOSIS — N183 Chronic kidney disease, stage 3 unspecified: Secondary | ICD-10-CM

## 2015-06-27 DIAGNOSIS — I129 Hypertensive chronic kidney disease with stage 1 through stage 4 chronic kidney disease, or unspecified chronic kidney disease: Secondary | ICD-10-CM

## 2015-07-06 DIAGNOSIS — G894 Chronic pain syndrome: Secondary | ICD-10-CM | POA: Diagnosis not present

## 2015-07-06 DIAGNOSIS — Z1389 Encounter for screening for other disorder: Secondary | ICD-10-CM | POA: Diagnosis not present

## 2015-07-06 DIAGNOSIS — Z6837 Body mass index (BMI) 37.0-37.9, adult: Secondary | ICD-10-CM | POA: Diagnosis not present

## 2015-07-06 DIAGNOSIS — F419 Anxiety disorder, unspecified: Secondary | ICD-10-CM | POA: Diagnosis not present

## 2015-07-13 ENCOUNTER — Other Ambulatory Visit (HOSPITAL_COMMUNITY): Payer: Self-pay | Admitting: Physician Assistant

## 2015-07-13 DIAGNOSIS — Z1231 Encounter for screening mammogram for malignant neoplasm of breast: Secondary | ICD-10-CM

## 2015-07-18 ENCOUNTER — Ambulatory Visit (HOSPITAL_COMMUNITY)
Admission: RE | Admit: 2015-07-18 | Discharge: 2015-07-18 | Disposition: A | Discharge status: Home / Self Care | Payer: Medicare Other | Source: Ambulatory Visit | Attending: Nephrology | Admitting: Nephrology

## 2015-07-18 DIAGNOSIS — F419 Anxiety disorder, unspecified: Secondary | ICD-10-CM | POA: Diagnosis not present

## 2015-07-18 DIAGNOSIS — Z6836 Body mass index (BMI) 36.0-36.9, adult: Secondary | ICD-10-CM | POA: Diagnosis not present

## 2015-07-18 DIAGNOSIS — N2889 Other specified disorders of kidney and ureter: Secondary | ICD-10-CM | POA: Insufficient documentation

## 2015-07-18 DIAGNOSIS — N183 Chronic kidney disease, stage 3 unspecified: Secondary | ICD-10-CM

## 2015-07-18 DIAGNOSIS — Z79899 Other long term (current) drug therapy: Secondary | ICD-10-CM | POA: Diagnosis not present

## 2015-07-18 DIAGNOSIS — Z1389 Encounter for screening for other disorder: Secondary | ICD-10-CM | POA: Diagnosis not present

## 2015-07-18 DIAGNOSIS — I129 Hypertensive chronic kidney disease with stage 1 through stage 4 chronic kidney disease, or unspecified chronic kidney disease: Secondary | ICD-10-CM

## 2015-07-18 DIAGNOSIS — G894 Chronic pain syndrome: Secondary | ICD-10-CM | POA: Diagnosis not present

## 2015-07-18 DIAGNOSIS — E039 Hypothyroidism, unspecified: Secondary | ICD-10-CM | POA: Diagnosis not present

## 2015-07-18 DIAGNOSIS — I10 Essential (primary) hypertension: Secondary | ICD-10-CM | POA: Diagnosis not present

## 2015-07-18 DIAGNOSIS — C189 Malignant neoplasm of colon, unspecified: Secondary | ICD-10-CM | POA: Diagnosis not present

## 2015-07-18 DIAGNOSIS — D509 Iron deficiency anemia, unspecified: Secondary | ICD-10-CM | POA: Diagnosis not present

## 2015-07-18 DIAGNOSIS — R809 Proteinuria, unspecified: Secondary | ICD-10-CM | POA: Diagnosis not present

## 2015-07-18 DIAGNOSIS — Z0001 Encounter for general adult medical examination with abnormal findings: Secondary | ICD-10-CM | POA: Diagnosis not present

## 2015-07-23 ENCOUNTER — Ambulatory Visit (HOSPITAL_COMMUNITY)
Admission: RE | Admit: 2015-07-23 | Discharge: 2015-07-23 | Disposition: A | Discharge status: Home / Self Care | Payer: Medicare Other | Source: Ambulatory Visit | Attending: Physician Assistant | Admitting: Physician Assistant

## 2015-07-23 DIAGNOSIS — Z1231 Encounter for screening mammogram for malignant neoplasm of breast: Secondary | ICD-10-CM | POA: Diagnosis not present

## 2015-08-01 DIAGNOSIS — I10 Essential (primary) hypertension: Secondary | ICD-10-CM | POA: Diagnosis not present

## 2015-08-01 DIAGNOSIS — N183 Chronic kidney disease, stage 3 (moderate): Secondary | ICD-10-CM | POA: Diagnosis not present

## 2015-08-01 DIAGNOSIS — E559 Vitamin D deficiency, unspecified: Secondary | ICD-10-CM | POA: Diagnosis not present

## 2015-08-06 DIAGNOSIS — E039 Hypothyroidism, unspecified: Secondary | ICD-10-CM | POA: Diagnosis not present

## 2015-11-02 DIAGNOSIS — M160 Bilateral primary osteoarthritis of hip: Secondary | ICD-10-CM | POA: Diagnosis not present

## 2015-11-02 DIAGNOSIS — Z6837 Body mass index (BMI) 37.0-37.9, adult: Secondary | ICD-10-CM | POA: Diagnosis not present

## 2015-11-02 DIAGNOSIS — Z1389 Encounter for screening for other disorder: Secondary | ICD-10-CM | POA: Diagnosis not present

## 2015-11-02 DIAGNOSIS — E063 Autoimmune thyroiditis: Secondary | ICD-10-CM | POA: Diagnosis not present

## 2015-11-12 DIAGNOSIS — D649 Anemia, unspecified: Secondary | ICD-10-CM | POA: Diagnosis not present

## 2015-11-12 DIAGNOSIS — R809 Proteinuria, unspecified: Secondary | ICD-10-CM | POA: Diagnosis not present

## 2015-11-12 DIAGNOSIS — Z79899 Other long term (current) drug therapy: Secondary | ICD-10-CM | POA: Diagnosis not present

## 2015-11-12 DIAGNOSIS — E559 Vitamin D deficiency, unspecified: Secondary | ICD-10-CM | POA: Diagnosis not present

## 2015-11-12 DIAGNOSIS — I10 Essential (primary) hypertension: Secondary | ICD-10-CM | POA: Diagnosis not present

## 2015-11-12 DIAGNOSIS — N183 Chronic kidney disease, stage 3 (moderate): Secondary | ICD-10-CM | POA: Diagnosis not present

## 2015-11-14 DIAGNOSIS — D649 Anemia, unspecified: Secondary | ICD-10-CM | POA: Diagnosis not present

## 2015-11-14 DIAGNOSIS — N184 Chronic kidney disease, stage 4 (severe): Secondary | ICD-10-CM | POA: Diagnosis not present

## 2015-11-14 DIAGNOSIS — I10 Essential (primary) hypertension: Secondary | ICD-10-CM | POA: Diagnosis not present

## 2015-11-28 DIAGNOSIS — N183 Chronic kidney disease, stage 3 (moderate): Secondary | ICD-10-CM | POA: Diagnosis not present

## 2015-11-28 DIAGNOSIS — I10 Essential (primary) hypertension: Secondary | ICD-10-CM | POA: Diagnosis not present

## 2015-11-28 DIAGNOSIS — Z79899 Other long term (current) drug therapy: Secondary | ICD-10-CM | POA: Diagnosis not present

## 2015-12-12 DIAGNOSIS — D649 Anemia, unspecified: Secondary | ICD-10-CM | POA: Diagnosis not present

## 2015-12-12 DIAGNOSIS — N183 Chronic kidney disease, stage 3 (moderate): Secondary | ICD-10-CM | POA: Diagnosis not present

## 2015-12-12 DIAGNOSIS — R809 Proteinuria, unspecified: Secondary | ICD-10-CM | POA: Diagnosis not present

## 2015-12-12 DIAGNOSIS — E559 Vitamin D deficiency, unspecified: Secondary | ICD-10-CM | POA: Diagnosis not present

## 2015-12-12 DIAGNOSIS — I10 Essential (primary) hypertension: Secondary | ICD-10-CM | POA: Diagnosis not present

## 2015-12-19 DIAGNOSIS — E559 Vitamin D deficiency, unspecified: Secondary | ICD-10-CM | POA: Diagnosis not present

## 2015-12-19 DIAGNOSIS — D649 Anemia, unspecified: Secondary | ICD-10-CM | POA: Diagnosis not present

## 2015-12-19 DIAGNOSIS — N184 Chronic kidney disease, stage 4 (severe): Secondary | ICD-10-CM | POA: Diagnosis not present

## 2015-12-19 DIAGNOSIS — I10 Essential (primary) hypertension: Secondary | ICD-10-CM | POA: Diagnosis not present

## 2016-02-08 DIAGNOSIS — I10 Essential (primary) hypertension: Secondary | ICD-10-CM | POA: Diagnosis not present

## 2016-02-08 DIAGNOSIS — Z1389 Encounter for screening for other disorder: Secondary | ICD-10-CM | POA: Diagnosis not present

## 2016-02-08 DIAGNOSIS — N184 Chronic kidney disease, stage 4 (severe): Secondary | ICD-10-CM | POA: Diagnosis not present

## 2016-02-08 DIAGNOSIS — E039 Hypothyroidism, unspecified: Secondary | ICD-10-CM | POA: Diagnosis not present

## 2016-02-08 DIAGNOSIS — E782 Mixed hyperlipidemia: Secondary | ICD-10-CM | POA: Diagnosis not present

## 2016-02-08 DIAGNOSIS — G894 Chronic pain syndrome: Secondary | ICD-10-CM | POA: Diagnosis not present

## 2016-03-14 DIAGNOSIS — R52 Pain, unspecified: Secondary | ICD-10-CM | POA: Diagnosis not present

## 2016-04-21 DIAGNOSIS — Z79899 Other long term (current) drug therapy: Secondary | ICD-10-CM | POA: Diagnosis not present

## 2016-04-21 DIAGNOSIS — R809 Proteinuria, unspecified: Secondary | ICD-10-CM | POA: Diagnosis not present

## 2016-04-21 DIAGNOSIS — D509 Iron deficiency anemia, unspecified: Secondary | ICD-10-CM | POA: Diagnosis not present

## 2016-04-21 DIAGNOSIS — N183 Chronic kidney disease, stage 3 (moderate): Secondary | ICD-10-CM | POA: Diagnosis not present

## 2016-04-21 DIAGNOSIS — E559 Vitamin D deficiency, unspecified: Secondary | ICD-10-CM | POA: Diagnosis not present

## 2016-05-15 ENCOUNTER — Ambulatory Visit: Payer: Medicare Other | Admitting: Orthopedic Surgery

## 2016-05-23 DIAGNOSIS — I10 Essential (primary) hypertension: Secondary | ICD-10-CM | POA: Diagnosis not present

## 2016-05-23 DIAGNOSIS — Z79899 Other long term (current) drug therapy: Secondary | ICD-10-CM | POA: Diagnosis not present

## 2016-05-23 DIAGNOSIS — N184 Chronic kidney disease, stage 4 (severe): Secondary | ICD-10-CM | POA: Diagnosis not present

## 2016-06-02 ENCOUNTER — Ambulatory Visit: Payer: Medicare Other

## 2016-06-02 ENCOUNTER — Encounter: Payer: Medicare Other | Admitting: Orthopedic Surgery

## 2016-07-10 DIAGNOSIS — Z79891 Long term (current) use of opiate analgesic: Secondary | ICD-10-CM | POA: Diagnosis not present

## 2016-07-10 DIAGNOSIS — G894 Chronic pain syndrome: Secondary | ICD-10-CM | POA: Diagnosis not present

## 2016-07-10 DIAGNOSIS — Z79899 Other long term (current) drug therapy: Secondary | ICD-10-CM | POA: Diagnosis not present

## 2016-07-10 DIAGNOSIS — F419 Anxiety disorder, unspecified: Secondary | ICD-10-CM | POA: Diagnosis not present

## 2016-08-05 DIAGNOSIS — Z79891 Long term (current) use of opiate analgesic: Secondary | ICD-10-CM | POA: Diagnosis not present

## 2016-08-21 DIAGNOSIS — G894 Chronic pain syndrome: Secondary | ICD-10-CM | POA: Diagnosis not present

## 2016-08-21 DIAGNOSIS — I10 Essential (primary) hypertension: Secondary | ICD-10-CM | POA: Diagnosis not present

## 2016-08-21 DIAGNOSIS — E782 Mixed hyperlipidemia: Secondary | ICD-10-CM | POA: Diagnosis not present

## 2016-08-21 DIAGNOSIS — Z1389 Encounter for screening for other disorder: Secondary | ICD-10-CM | POA: Diagnosis not present

## 2016-08-25 DIAGNOSIS — I10 Essential (primary) hypertension: Secondary | ICD-10-CM | POA: Diagnosis not present

## 2016-08-25 DIAGNOSIS — Z79899 Other long term (current) drug therapy: Secondary | ICD-10-CM | POA: Diagnosis not present

## 2016-08-25 DIAGNOSIS — N183 Chronic kidney disease, stage 3 (moderate): Secondary | ICD-10-CM | POA: Diagnosis not present

## 2016-08-25 DIAGNOSIS — D509 Iron deficiency anemia, unspecified: Secondary | ICD-10-CM | POA: Diagnosis not present

## 2016-08-25 DIAGNOSIS — R809 Proteinuria, unspecified: Secondary | ICD-10-CM | POA: Diagnosis not present

## 2016-08-27 DIAGNOSIS — N183 Chronic kidney disease, stage 3 (moderate): Secondary | ICD-10-CM | POA: Diagnosis not present

## 2016-08-27 DIAGNOSIS — D649 Anemia, unspecified: Secondary | ICD-10-CM | POA: Diagnosis not present

## 2016-08-27 DIAGNOSIS — N250 Renal osteodystrophy: Secondary | ICD-10-CM | POA: Diagnosis not present

## 2016-08-27 DIAGNOSIS — I10 Essential (primary) hypertension: Secondary | ICD-10-CM | POA: Diagnosis not present

## 2016-10-03 DIAGNOSIS — Z0000 Encounter for general adult medical examination without abnormal findings: Secondary | ICD-10-CM | POA: Diagnosis not present

## 2016-10-03 DIAGNOSIS — E063 Autoimmune thyroiditis: Secondary | ICD-10-CM | POA: Diagnosis not present

## 2016-10-03 DIAGNOSIS — G894 Chronic pain syndrome: Secondary | ICD-10-CM | POA: Diagnosis not present

## 2016-10-03 DIAGNOSIS — Z23 Encounter for immunization: Secondary | ICD-10-CM | POA: Diagnosis not present

## 2016-10-03 DIAGNOSIS — I10 Essential (primary) hypertension: Secondary | ICD-10-CM | POA: Diagnosis not present

## 2017-02-23 DIAGNOSIS — G894 Chronic pain syndrome: Secondary | ICD-10-CM | POA: Diagnosis not present

## 2017-02-23 DIAGNOSIS — Z1389 Encounter for screening for other disorder: Secondary | ICD-10-CM | POA: Diagnosis not present

## 2017-03-02 DIAGNOSIS — Z79899 Other long term (current) drug therapy: Secondary | ICD-10-CM | POA: Diagnosis not present

## 2017-03-02 DIAGNOSIS — I10 Essential (primary) hypertension: Secondary | ICD-10-CM | POA: Diagnosis not present

## 2017-03-02 DIAGNOSIS — D509 Iron deficiency anemia, unspecified: Secondary | ICD-10-CM | POA: Diagnosis not present

## 2017-03-02 DIAGNOSIS — N183 Chronic kidney disease, stage 3 (moderate): Secondary | ICD-10-CM | POA: Diagnosis not present

## 2017-03-02 DIAGNOSIS — E559 Vitamin D deficiency, unspecified: Secondary | ICD-10-CM | POA: Diagnosis not present

## 2017-03-02 DIAGNOSIS — R809 Proteinuria, unspecified: Secondary | ICD-10-CM | POA: Diagnosis not present

## 2017-03-05 DIAGNOSIS — N183 Chronic kidney disease, stage 3 (moderate): Secondary | ICD-10-CM | POA: Diagnosis not present

## 2017-03-05 DIAGNOSIS — E559 Vitamin D deficiency, unspecified: Secondary | ICD-10-CM | POA: Diagnosis not present

## 2017-03-05 DIAGNOSIS — I959 Hypotension, unspecified: Secondary | ICD-10-CM | POA: Diagnosis not present

## 2017-03-05 DIAGNOSIS — R809 Proteinuria, unspecified: Secondary | ICD-10-CM | POA: Diagnosis not present

## 2017-03-16 ENCOUNTER — Emergency Department (HOSPITAL_COMMUNITY): Payer: Medicare Other

## 2017-03-16 ENCOUNTER — Encounter (HOSPITAL_COMMUNITY): Payer: Self-pay | Admitting: *Deleted

## 2017-03-16 ENCOUNTER — Inpatient Hospital Stay (HOSPITAL_COMMUNITY)
Admission: EM | Admit: 2017-03-16 | Discharge: 2017-03-19 | DRG: 312 | Disposition: A | Discharge status: Home / Self Care | Payer: Medicare Other | Attending: Internal Medicine | Admitting: Internal Medicine

## 2017-03-16 DIAGNOSIS — Z87440 Personal history of urinary (tract) infections: Secondary | ICD-10-CM | POA: Diagnosis not present

## 2017-03-16 DIAGNOSIS — Z9841 Cataract extraction status, right eye: Secondary | ICD-10-CM | POA: Diagnosis not present

## 2017-03-16 DIAGNOSIS — E039 Hypothyroidism, unspecified: Secondary | ICD-10-CM | POA: Diagnosis not present

## 2017-03-16 DIAGNOSIS — N390 Urinary tract infection, site not specified: Secondary | ICD-10-CM | POA: Diagnosis present

## 2017-03-16 DIAGNOSIS — Z8261 Family history of arthritis: Secondary | ICD-10-CM | POA: Diagnosis not present

## 2017-03-16 DIAGNOSIS — E869 Volume depletion, unspecified: Secondary | ICD-10-CM | POA: Diagnosis present

## 2017-03-16 DIAGNOSIS — S069X9A Unspecified intracranial injury with loss of consciousness of unspecified duration, initial encounter: Secondary | ICD-10-CM | POA: Diagnosis not present

## 2017-03-16 DIAGNOSIS — Z79891 Long term (current) use of opiate analgesic: Secondary | ICD-10-CM

## 2017-03-16 DIAGNOSIS — R935 Abnormal findings on diagnostic imaging of other abdominal regions, including retroperitoneum: Secondary | ICD-10-CM | POA: Diagnosis not present

## 2017-03-16 DIAGNOSIS — N179 Acute kidney failure, unspecified: Secondary | ICD-10-CM | POA: Diagnosis present

## 2017-03-16 DIAGNOSIS — R197 Diarrhea, unspecified: Secondary | ICD-10-CM

## 2017-03-16 DIAGNOSIS — I959 Hypotension, unspecified: Secondary | ICD-10-CM | POA: Diagnosis present

## 2017-03-16 DIAGNOSIS — I452 Bifascicular block: Secondary | ICD-10-CM | POA: Diagnosis present

## 2017-03-16 DIAGNOSIS — Z9049 Acquired absence of other specified parts of digestive tract: Secondary | ICD-10-CM | POA: Diagnosis not present

## 2017-03-16 DIAGNOSIS — I10 Essential (primary) hypertension: Secondary | ICD-10-CM | POA: Diagnosis not present

## 2017-03-16 DIAGNOSIS — R1111 Vomiting without nausea: Secondary | ICD-10-CM | POA: Diagnosis not present

## 2017-03-16 DIAGNOSIS — T68XXXA Hypothermia, initial encounter: Secondary | ICD-10-CM | POA: Diagnosis not present

## 2017-03-16 DIAGNOSIS — Z6827 Body mass index (BMI) 27.0-27.9, adult: Secondary | ICD-10-CM

## 2017-03-16 DIAGNOSIS — Z885 Allergy status to narcotic agent status: Secondary | ICD-10-CM

## 2017-03-16 DIAGNOSIS — F419 Anxiety disorder, unspecified: Secondary | ICD-10-CM | POA: Diagnosis present

## 2017-03-16 DIAGNOSIS — E669 Obesity, unspecified: Secondary | ICD-10-CM | POA: Diagnosis present

## 2017-03-16 DIAGNOSIS — E860 Dehydration: Secondary | ICD-10-CM | POA: Diagnosis not present

## 2017-03-16 DIAGNOSIS — R55 Syncope and collapse: Secondary | ICD-10-CM | POA: Diagnosis present

## 2017-03-16 DIAGNOSIS — I4510 Unspecified right bundle-branch block: Secondary | ICD-10-CM | POA: Diagnosis present

## 2017-03-16 DIAGNOSIS — R001 Bradycardia, unspecified: Secondary | ICD-10-CM | POA: Diagnosis not present

## 2017-03-16 DIAGNOSIS — R932 Abnormal findings on diagnostic imaging of liver and biliary tract: Secondary | ICD-10-CM | POA: Diagnosis not present

## 2017-03-16 DIAGNOSIS — Z96641 Presence of right artificial hip joint: Secondary | ICD-10-CM | POA: Diagnosis present

## 2017-03-16 DIAGNOSIS — I248 Other forms of acute ischemic heart disease: Secondary | ICD-10-CM | POA: Diagnosis not present

## 2017-03-16 DIAGNOSIS — Z85038 Personal history of other malignant neoplasm of large intestine: Secondary | ICD-10-CM

## 2017-03-16 DIAGNOSIS — R112 Nausea with vomiting, unspecified: Secondary | ICD-10-CM | POA: Diagnosis not present

## 2017-03-16 DIAGNOSIS — R404 Transient alteration of awareness: Secondary | ICD-10-CM | POA: Diagnosis not present

## 2017-03-16 DIAGNOSIS — Z87891 Personal history of nicotine dependence: Secondary | ICD-10-CM

## 2017-03-16 DIAGNOSIS — Z79899 Other long term (current) drug therapy: Secondary | ICD-10-CM

## 2017-03-16 DIAGNOSIS — K7689 Other specified diseases of liver: Secondary | ICD-10-CM

## 2017-03-16 DIAGNOSIS — Z9842 Cataract extraction status, left eye: Secondary | ICD-10-CM

## 2017-03-16 DIAGNOSIS — E785 Hyperlipidemia, unspecified: Secondary | ICD-10-CM | POA: Diagnosis present

## 2017-03-16 DIAGNOSIS — Z833 Family history of diabetes mellitus: Secondary | ICD-10-CM | POA: Diagnosis not present

## 2017-03-16 DIAGNOSIS — I9589 Other hypotension: Secondary | ICD-10-CM | POA: Diagnosis not present

## 2017-03-16 DIAGNOSIS — R1110 Vomiting, unspecified: Secondary | ICD-10-CM | POA: Diagnosis not present

## 2017-03-16 DIAGNOSIS — R531 Weakness: Secondary | ICD-10-CM | POA: Diagnosis not present

## 2017-03-16 DIAGNOSIS — Z7982 Long term (current) use of aspirin: Secondary | ICD-10-CM

## 2017-03-16 HISTORY — DX: Syncope and collapse: R55

## 2017-03-16 HISTORY — DX: Bifascicular block: I45.2

## 2017-03-16 LAB — CBC WITH DIFFERENTIAL/PLATELET
Basophils Absolute: 0 10*3/uL (ref 0.0–0.1)
Basophils Relative: 0 %
Eosinophils Absolute: 0.1 10*3/uL (ref 0.0–0.7)
Eosinophils Relative: 1 %
HCT: 37 % (ref 36.0–46.0)
Hemoglobin: 12 g/dL (ref 12.0–15.0)
Lymphocytes Relative: 13 %
Lymphs Abs: 1.7 10*3/uL (ref 0.7–4.0)
MCH: 29.6 pg (ref 26.0–34.0)
MCHC: 32.4 g/dL (ref 30.0–36.0)
MCV: 91.1 fL (ref 78.0–100.0)
Monocytes Absolute: 0.6 10*3/uL (ref 0.1–1.0)
Monocytes Relative: 5 %
Neutro Abs: 10.9 10*3/uL — ABNORMAL HIGH (ref 1.7–7.7)
Neutrophils Relative %: 81 %
Platelets: 146 10*3/uL — ABNORMAL LOW (ref 150–400)
RBC: 4.06 MIL/uL (ref 3.87–5.11)
RDW: 13.2 % (ref 11.5–15.5)
WBC: 13.4 10*3/uL — ABNORMAL HIGH (ref 4.0–10.5)

## 2017-03-16 LAB — COMPREHENSIVE METABOLIC PANEL
ALT: 15 U/L (ref 14–54)
AST: 22 U/L (ref 15–41)
Albumin: 3.8 g/dL (ref 3.5–5.0)
Alkaline Phosphatase: 43 U/L (ref 38–126)
Anion gap: 7 (ref 5–15)
BUN: 48 mg/dL — ABNORMAL HIGH (ref 6–20)
CO2: 25 mmol/L (ref 22–32)
Calcium: 9 mg/dL (ref 8.9–10.3)
Chloride: 103 mmol/L (ref 101–111)
Creatinine, Ser: 1.99 mg/dL — ABNORMAL HIGH (ref 0.44–1.00)
GFR calc Af Amer: 27 mL/min — ABNORMAL LOW (ref >60)
GFR calc non Af Amer: 23 mL/min — ABNORMAL LOW (ref >60)
Glucose, Bld: 142 mg/dL — ABNORMAL HIGH (ref 65–99)
Potassium: 4 mmol/L (ref 3.5–5.1)
Sodium: 135 mmol/L (ref 135–145)
Total Bilirubin: 0.2 mg/dL — ABNORMAL LOW (ref 0.3–1.2)
Total Protein: 6.4 g/dL — ABNORMAL LOW (ref 6.5–8.1)

## 2017-03-16 LAB — I-STAT TROPONIN, ED: Troponin i, poc: 0 ng/mL (ref 0.00–0.08)

## 2017-03-16 MED ORDER — SODIUM CHLORIDE 0.9 % IV BOLUS (SEPSIS)
500.0000 mL | Freq: Once | INTRAVENOUS | Status: AC
  Administered 2017-03-16: 500 mL via INTRAVENOUS

## 2017-03-16 NOTE — ED Provider Notes (Signed)
76 year old female history of syncope, hypertension, anemia presents today with syncopal episode after vomiting multiple times in the bathroom. She thinks she may have struck her head. She is transported via EMS is received 1 L normal saline prior to my valuation. She continues hypotensive at 85/42 and bradycardic with heart rate in the 50s. Heart is bradycardic Lungs are clear with decreased breath sounds bilateral bases Abdomen is soft with tenderness in the lower abdomen noted.  Orders placed   Pattricia Boss, MD 03/17/17 1510

## 2017-03-16 NOTE — ED Triage Notes (Signed)
Pt brought in by [facility] for c/o syncopal episode; pt was found on bathroom floor by ems; pt had went to bathroom because she felt nauseous; pt denies any pain; cbg 257

## 2017-03-16 NOTE — ED Provider Notes (Addendum)
[facility] DEPT Provider Note   CSN: [id number] Arrival date & time: 03/16/17  2136  By signing my name below, I, Margit Banda, attest that this documentation has been prepared under the direction and in the presence of Daleen Bo, MD. Electronically Signed: Margit Banda, ED Scribe. 03/16/17. 11:16 PM.   History   Chief Complaint Chief Complaint  Patient presents with  . Loss of Consciousness    HPI Carol Rosario is a 76 y.o. female who presents to the Emergency Department complaining of sudden LOC s/p a  fall around 6:30-7 pm on 03/16/17. After dinner pt started sweating and felt stomach cramps. She was proceeding to the bathroom when she suddenly lost consciousness for ~ 10 seconds. Associated sx include nausea, vomiting (2-3), and diarrhea. Pt denies HA, hematemesis, and BP.  The patient had eaten more dinner, than usual, preceding this episode.   The history is provided by the patient and a relative. No language interpreter was used.    Past Medical History:  Diagnosis Date  . Anxiety   . Cancer ([place]) 08/2012   colon cancer  . Degeneration of lumbar or lumbosacral intervertebral disc   . DJD (degenerative joint disease)   . Family history of tobacco abuse and dependence   . History of recurrent UTIs   . Hyperlipidemia   . Hypertension   . Hypothyroidism   . Obesity   . Palpitations   . PONV (postoperative nausea and vomiting)   . Sciatica     Patient Active Problem List   Diagnosis Date Noted  . Influenza 12/23/2013  . S/P total hip arthroplasty 05/16/2013  . Degenerative arthritis of hip 05/02/2013  . Radicular leg pain 03/15/2013  . Back pain 03/15/2013  . Arthritis pain of hip 03/15/2013  . Anemia 08/09/2012  . Heme positive stool 08/09/2012  . Hypertension 05/18/2012  . Osteoarthritis of hip 10/23/2011  . Hip pain 10/23/2011  . Arthritis of knee, degenerative 09/09/2011  . TOTAL HIP FOLLOW-UP 08/27/2010  . HIP, ARTHRITIS, DEGEN./OSTEO  07/29/2010  . OBESITY 01/16/2010  . DENTAL CARIES 01/16/2010  . SYNCOPE, HX OF 01/16/2010  . 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 SCIATICA 04/17/2008    Past Surgical History:  Procedure Laterality Date  . bilateral tubal ligation    . bladder resuspension    . CATARACT EXTRACTION, BILATERAL    . CHOLECYSTECTOMY    . FLEXIBLE SIGMOIDOSCOPY  08/26/2012   Procedure: FLEXIBLE SIGMOIDOSCOPY;  Surgeon: Daneil Dolin, MD;  Location: [facility];  Service: Endoscopy;  Laterality: N/A;  . HIP SURGERY  2011   left, [facility]  . KNEE SURGERY     left arthroscopy  . PARTIAL COLECTOMY  09/13/2012   Procedure: PARTIAL COLECTOMY;  Surgeon: Jamesetta So, MD;  Location: [facility] ORS;  Service: General;  Laterality: N/A;  . TOTAL HIP ARTHROPLASTY Right 05/02/2013   Procedure: TOTAL HIP ARTHROPLASTY;  Surgeon: Carole Civil, MD;  Location: [facility] ORS;  Service: Orthopedics;  Laterality: Right;  Right Total Hip Arthroplasty  . TUBAL LIGATION    . VESICOVAGINAL FISTULA CLOSURE W/ TAH      OB History    No data available       Home Medications    Prior to Admission medications   Medication Sig Start Date End Date Taking? Authorizing Provider  ALPRAZolam Duanne Moron) 0.5 MG tablet Take 0.5 mg by mouth at bedtime as needed. Sleep 06/21/12  Yes Historical Provider, MD  gabapentin (NEURONTIN) 100 MG capsule Take  1 capsule (100 mg total) by mouth 3 (three) times daily. Patient taking differently: Take 200 mg by mouth 3 (three) times daily.  03/15/13  Yes Carole Civil, MD  HYDROcodone-acetaminophen ([place]) 7.5-325 MG per tablet Take 1 tablet by mouth every 6 (six) hours as needed for moderate pain.   Yes Historical Provider, MD  aspirin EC 325 MG EC tablet Take 1 tablet (325 mg total) by mouth 2 (two) times daily. 05/05/13   Carole Civil, MD  Dextromethorphan-Guaifenesin (ROBITUSSIN DM) 10-100 MG/5ML liquid Take 5 mLs by mouth every 12 (twelve) hours. 12/23/13   Lendon Colonel, NP  fish  oil-omega-3 fatty acids 1000 MG capsule Take 2 g by mouth daily.    Historical Provider, MD  levothyroxine (SYNTHROID, LEVOTHROID) 50 MCG tablet Take 50 mcg by mouth daily.    Historical Provider, MD  lisinopril-hydrochlorothiazide (PRINZIDE,ZESTORETIC) 20-12.5 MG per tablet Take 1 tablet by mouth daily.    Historical Provider, MD  methocarbamol (ROBAXIN) 500 MG tablet Take 1 tablet (500 mg total) by mouth every 6 (six) hours as needed. 11/03/13   Carole Civil, MD  oseltamivir (TAMIFLU) 75 MG capsule Take 1 capsule (75 mg total) by mouth 2 (two) times daily. For 5 days 12/23/13   Lendon Colonel, NP  oxyCODONE-acetaminophen (PERCOCET/ROXICET) 5-325 MG per tablet Take 1 tablet by mouth every 4 (four) hours. 06/21/13   Carole Civil, MD  Probiotic Product ([place]) CAPS Take 1 capsule by mouth every other day.    Historical Provider, MD  ZOCOR 40 MG tablet TAKE ONE TABLET BY MOUTH AT BEDTIME. 11/26/12   Lendon Colonel, NP    Family History Family History  Problem Relation Age of Onset  . Arthritis    . Cancer    . Diabetes    . Heart disease Neg Hx     Social History Social History  Substance Use Topics  . Smoking status: Former Smoker    Types: Cigarettes    Quit date: 08/13/2010  . Smokeless tobacco: Never Used  . Alcohol use No     Allergies   Codeine   Review of Systems Review of Systems  All other systems reviewed and are negative.    Physical Exam Updated Vital Signs BP (!) 87/52   Pulse (!) 46   Temp (!) 96 F (35.6 C) (Tympanic)   Resp 14   Ht 5\' 4"  (1.626 m)   Wt 153 lb (69.4 kg)   SpO2 99%   BMI 26.26 kg/m   Physical Exam  Constitutional: She is oriented to person, place, and time. She appears well-developed.  Elderly, frail  HENT:  Head: Normocephalic and atraumatic.  Right Ear: External ear normal.  Left Ear: External ear normal.  Mild tenderness to chin. No trismus.  Eyes: Conjunctivae and EOM are normal. Pupils are  equal, round, and reactive to light.  Neck: Normal range of motion and phonation normal. Neck supple.  Cardiovascular: Normal rate, regular rhythm and normal heart sounds.   Pulmonary/Chest: Effort normal and breath sounds normal. She exhibits no bony tenderness.  Abdominal: Soft. She exhibits no mass. There is tenderness (mild diffuse). There is no rebound and no guarding.  Musculoskeletal: Normal range of motion.  Neurological: She is alert and oriented to person, place, and time. No cranial nerve deficit or sensory deficit. She exhibits normal muscle tone. Coordination normal.  Skin: Skin is warm, dry and intact.  Psychiatric: She has a normal mood and affect. Her  behavior is normal. Judgment and thought content normal.  Nursing note and vitals reviewed.    ED Treatments / Results  DIAGNOSTIC STUDIES: Oxygen Saturation is 99% on [place], normal by my interpretation.   COORDINATION OF CARE: 11:08 PM-Discussed next steps with pt. Pt verbalized understanding and is agreeable with the plan.    Labs (all labs ordered are listed, but only abnormal results are displayed) Labs Reviewed  CBC WITH DIFFERENTIAL/PLATELET - Abnormal; Notable for the following:       Result Value   WBC 13.4 (*)    Platelets 146 (*)    Neutro Abs 10.9 (*)    All other components within normal limits  COMPREHENSIVE METABOLIC PANEL - Abnormal; Notable for the following:    Glucose, Bld 142 (*)    BUN 48 (*)    Creatinine, Ser 1.99 (*)    Total Protein 6.4 (*)    Total Bilirubin 0.2 (*)    GFR calc non Af Amer 23 (*)    GFR calc Af Amer 27 (*)    All other components within normal limits  URINALYSIS, ROUTINE W REFLEX MICROSCOPIC - Abnormal; Notable for the following:    APPearance HAZY (*)    Hgb urine dipstick SMALL (*)    Nitrite POSITIVE (*)    Leukocytes, UA TRACE (*)    Bacteria, UA MANY (*)    Squamous Epithelial / LPF 0-5 (*)    All other components within normal limits  C DIFFICILE QUICK SCREEN W  PCR REFLEX  CULTURE, BLOOD (ROUTINE X 2)  CULTURE, BLOOD (ROUTINE X 2)  GASTROINTESTINAL PANEL BY PCR, STOOL (REPLACES STOOL CULTURE)  I-STAT TROPOININ, ED  I-STAT CG4 LACTIC ACID, ED    EKG  EKG Interpretation  Date/Time:  Monday March 16 2017 21:46:21 EDT Ventricular Rate:  48 PR Interval:    QRS Duration: 155 QT Interval:  496 QTC Calculation: 444 R Axis:   -51 Text Interpretation:  Sinus bradycardia RBBB and LAFB Probable left ventricular hypertrophy Confirmed by RAY MD, Andee Poles [phone]) on 03/16/2017 10:42:53 PM       Radiology Ct Abdomen Pelvis Wo Contrast  Result Date: 03/17/2017 CLINICAL DATA:  Stomach cramps, nausea vomiting and diarrhea EXAM: CT ABDOMEN AND PELVIS WITHOUT CONTRAST TECHNIQUE: Multidetector CT imaging of the abdomen and pelvis was performed following the standard protocol without IV contrast. COMPARISON:  None. FINDINGS: Lower chest: Lung bases demonstrate scarring in the right lower lobe. Irregular density in the lingula, suspect scarring. No pleural effusion. Nonenlarged heart. Hepatobiliary: 2.2 cm low-density lesion in the right hepatic lobe. No other focal hepatic abnormalities are seen. Surgical absence of the gallbladder. No biliary dilatation. Pancreas: Coarse pancreatic calcification.  No inflammation. Spleen: Normal in size without focal abnormality. Adrenals/Urinary Tract: Adrenal glands are within normal limits. No hydronephrosis. The bladder is unremarkable but obscured by artifact. Stomach/Bowel: No dilated small bowel. The stomach contains debris postsurgical changes of the sigmoid colon. No wall thickening. Appendix not well identified but no right lower quadrant inflammation Vascular/Lymphatic: Atherosclerosis. No aneurysmal dilatation. No grossly enlarged lymph nodes Reproductive: Status post hysterectomy. No adnexal masses. Rectovaginal soft tissue fullness with air in the vagina. Other: No free air. No significant free fluid. Fat containing  infraumbilical ventral hernia. Musculoskeletal: Degenerative changes. No acute or suspicious bone lesion. IMPRESSION: 1. Negative for bowel obstruction or acute inflammatory process 2. 2.2 cm indeterminate hypodense lesion in the right hepatic lobe ; further evaluation with contrast-enhanced CT or MRI may be obtained on a nonemergent  basis. 3. Small fat containing ventral hernia. 4. Rectovaginal soft tissue fullness with small amount of air in the vagina, correlate with physical exam. Electronically Signed   By: Donavan Foil M.D.   On: 03/17/2017 00:50   Ct Head Wo Contrast  Result Date: 03/17/2017 CLINICAL DATA:  Sudden LOC after a fall EXAM: CT HEAD WITHOUT CONTRAST TECHNIQUE: Contiguous axial images were obtained from the base of the skull through the vertex without intravenous contrast. COMPARISON:  None. FINDINGS: Brain: No acute territorial infarction, hemorrhage, or focal mass lesion is visualized. There is moderate periventricular, subcortical and deep white matter hypodensity consistent with small vessel ischemia. Mild to moderate atrophy. The ventricles are nonenlarged. Vascular: No hyperdense vessels. There are carotid artery calcifications. Skull: No fracture. Small amount of fluid in the inferior left mastoid. No suspicious bone lesion. Sinuses/Orbits: No acute orbital abnormality. Minimal mucosal thickening in the sphenoid and ethmoid sinuses. Other: None IMPRESSION: No CT evidence for acute intracranial abnormality. Moderate white matter small vessel ischemic changes. Electronically Signed   By: Donavan Foil M.D.   On: 03/17/2017 00:40    Procedures Procedures (including critical care time)  Medications Ordered in ED Medications  cefTRIAXone (ROCEPHIN) 1 g in dextrose 5 % 50 mL IVPB (1 g Intravenous New Bag/Given 03/17/17 0145)  sodium chloride 0.9 % bolus 500 mL (500 mLs Intravenous New Bag/Given 03/16/17 2352)  sodium chloride 0.9 % bolus 500 mL (500 mLs Intravenous New Bag/Given 03/16/17  2353)     Initial Impression / Assessment and Plan / ED Course  I have reviewed the triage vital signs and the nursing notes.  Pertinent labs & imaging results that were available during my care of the patient were reviewed by me and considered in my medical decision making (see chart for details).  Clinical Course as of Mar 17 204  Mon Mar 16, 2017  2355 Hypothermia, on rectal evaluation, lactate, blood cultures, and warming blanket ordered  [EW]  Tue Mar 17, 2017  0010 Patient has developed copious stooling, with incontinence.  Laboratory evaluation of stool ordered  [EW]  0111 Lactate, 1.06, 2355  [EW]  0112 High Glucose: (!) 142 [EW]  0113 High BUN: (!) 48 [EW]  0113  High Creatinine: (!) 1.99 [EW]  0114 Abnormal Bacteria, UA: (!) MANY [EW]  0114 Consistent with acute infection Nitrite: (!) POSITIVE [EW]  0114 High WBC: (!) 13.4 [EW]  0203 The patient's daughter, Laverda Sorenson, left her phone numbers : [phone].  Cell phone [phone].  She would like to be contacted, if there are any problems.  [EW]    Clinical Course User Index [EW] Daleen Bo, MD    Medications  cefTRIAXone (ROCEPHIN) 1 g in dextrose 5 % 50 mL IVPB (1 g Intravenous New Bag/Given 03/17/17 0145)  sodium chloride 0.9 % bolus 500 mL (500 mLs Intravenous New Bag/Given 03/16/17 2352)  sodium chloride 0.9 % bolus 500 mL (500 mLs Intravenous New Bag/Given 03/16/17 2353)    Patient Vitals for the past 24 hrs:  BP Temp Temp src Pulse Resp SpO2 Height Weight  03/17/17 0130 (!) 88/62 - - (!) 57 13 96 % - -  03/17/17 0100 (!) 94/53 - - (!) 51 12 98 % - -  03/17/17 0030 (!) 111/53 - - (!) 52 12 97 % - -  03/17/17 0000 106/60 - - (!) 52 14 98 % - -  03/16/17 2353 - (!) 95.4 F (35.2 C) Rectal - - - - -  03/16/17 2219 - Marland Kitchen)  96 F (35.6 C) Tympanic - - - - -  03/16/17 2200 (!) 87/52 - - (!) 46 14 99 % - -  03/16/17 2139 - - - - - - 5\' 4"  (1.626 m) 153 lb (69.4 kg)    1:18 AM Reevaluation with update and  discussion. After initial assessment and treatment, an updated evaluation reveals she is resting comfortably now.  Findings discussed with patient's family members, all questions answered. Xitlally Mooneyham L   1:20 AM-Consult complete with hospitalist. Patient case explained and discussed. He agrees to admit patient for further evaluation and treatment. Call ended at [facility]  Final Clinical Impressions(s) / ED Diagnoses   Final diagnoses:  Syncope, unspecified syncope type  Hypothermia, initial encounter  Liver cyst  Urinary tract infection without hematuria, site unspecified  Diarrhea, unspecified type  AKI (acute kidney injury) ([place])    Syncope likely secondary to urinary tract infection.  Nonspecific diarrhea, post syncope.  Moderate hypotension, and hypothermia, with normal lactate.  This evaluation is consistent with non-severe sepsis.  Creatinine is elevated from baseline.  Cultures pending.  Empiric Rocephin started; will arrange admission.  Nursing Notes Reviewed/ Care Coordinated Applicable Imaging Reviewed Interpretation of Laboratory Data incorporated into ED treatment  Plan: Admit  New Prescriptions New Prescriptions   No medications on file   I personally performed the services described in this documentation, which was scribed in my presence. The recorded information has been reviewed and is accurate.     Daleen Bo, MD 03/17/17 [place], MD 03/17/17 [phone]

## 2017-03-17 ENCOUNTER — Inpatient Hospital Stay (HOSPITAL_COMMUNITY): Payer: Medicare Other

## 2017-03-17 ENCOUNTER — Encounter (HOSPITAL_COMMUNITY): Payer: Self-pay | Admitting: Internal Medicine

## 2017-03-17 DIAGNOSIS — Z833 Family history of diabetes mellitus: Secondary | ICD-10-CM | POA: Diagnosis not present

## 2017-03-17 DIAGNOSIS — I9589 Other hypotension: Secondary | ICD-10-CM | POA: Diagnosis not present

## 2017-03-17 DIAGNOSIS — Z9842 Cataract extraction status, left eye: Secondary | ICD-10-CM | POA: Diagnosis not present

## 2017-03-17 DIAGNOSIS — E860 Dehydration: Secondary | ICD-10-CM | POA: Diagnosis present

## 2017-03-17 DIAGNOSIS — Z87440 Personal history of urinary (tract) infections: Secondary | ICD-10-CM | POA: Diagnosis not present

## 2017-03-17 DIAGNOSIS — S069X9A Unspecified intracranial injury with loss of consciousness of unspecified duration, initial encounter: Secondary | ICD-10-CM | POA: Diagnosis not present

## 2017-03-17 DIAGNOSIS — I452 Bifascicular block: Secondary | ICD-10-CM | POA: Diagnosis not present

## 2017-03-17 DIAGNOSIS — Z6827 Body mass index (BMI) 27.0-27.9, adult: Secondary | ICD-10-CM | POA: Diagnosis not present

## 2017-03-17 DIAGNOSIS — R1110 Vomiting, unspecified: Secondary | ICD-10-CM | POA: Diagnosis not present

## 2017-03-17 DIAGNOSIS — N179 Acute kidney failure, unspecified: Secondary | ICD-10-CM | POA: Diagnosis not present

## 2017-03-17 DIAGNOSIS — N390 Urinary tract infection, site not specified: Secondary | ICD-10-CM | POA: Diagnosis present

## 2017-03-17 DIAGNOSIS — R001 Bradycardia, unspecified: Secondary | ICD-10-CM | POA: Diagnosis not present

## 2017-03-17 DIAGNOSIS — E785 Hyperlipidemia, unspecified: Secondary | ICD-10-CM | POA: Diagnosis present

## 2017-03-17 DIAGNOSIS — Z9841 Cataract extraction status, right eye: Secondary | ICD-10-CM | POA: Diagnosis not present

## 2017-03-17 DIAGNOSIS — I248 Other forms of acute ischemic heart disease: Secondary | ICD-10-CM | POA: Diagnosis present

## 2017-03-17 DIAGNOSIS — R935 Abnormal findings on diagnostic imaging of other abdominal regions, including retroperitoneum: Secondary | ICD-10-CM | POA: Diagnosis present

## 2017-03-17 DIAGNOSIS — R55 Syncope and collapse: Secondary | ICD-10-CM

## 2017-03-17 DIAGNOSIS — I959 Hypotension, unspecified: Secondary | ICD-10-CM | POA: Diagnosis present

## 2017-03-17 DIAGNOSIS — Z885 Allergy status to narcotic agent status: Secondary | ICD-10-CM | POA: Diagnosis not present

## 2017-03-17 DIAGNOSIS — R1111 Vomiting without nausea: Secondary | ICD-10-CM | POA: Diagnosis not present

## 2017-03-17 DIAGNOSIS — Z96641 Presence of right artificial hip joint: Secondary | ICD-10-CM | POA: Diagnosis present

## 2017-03-17 DIAGNOSIS — E669 Obesity, unspecified: Secondary | ICD-10-CM | POA: Diagnosis present

## 2017-03-17 DIAGNOSIS — E039 Hypothyroidism, unspecified: Secondary | ICD-10-CM | POA: Diagnosis present

## 2017-03-17 DIAGNOSIS — Z87891 Personal history of nicotine dependence: Secondary | ICD-10-CM | POA: Diagnosis not present

## 2017-03-17 DIAGNOSIS — Z85038 Personal history of other malignant neoplasm of large intestine: Secondary | ICD-10-CM | POA: Diagnosis not present

## 2017-03-17 DIAGNOSIS — I10 Essential (primary) hypertension: Secondary | ICD-10-CM | POA: Diagnosis present

## 2017-03-17 DIAGNOSIS — F419 Anxiety disorder, unspecified: Secondary | ICD-10-CM | POA: Diagnosis present

## 2017-03-17 DIAGNOSIS — R932 Abnormal findings on diagnostic imaging of liver and biliary tract: Secondary | ICD-10-CM | POA: Diagnosis present

## 2017-03-17 DIAGNOSIS — Z9049 Acquired absence of other specified parts of digestive tract: Secondary | ICD-10-CM | POA: Diagnosis not present

## 2017-03-17 DIAGNOSIS — Z8261 Family history of arthritis: Secondary | ICD-10-CM | POA: Diagnosis not present

## 2017-03-17 LAB — ECHOCARDIOGRAM COMPLETE
FS: 35 % (ref 28–44)
Height: 64 in
IVS/LV PW RATIO, ED: 1.01
LA ID, A-P, ES: 26 mm
LA diam end sys: 26 mm
LA diam index: 1.47 cm/m2
LA vol A4C: 32.6 ml
LA vol index: 20.7 mL/m2
LA vol: 36.7 mL
LV PW d: 10.4 mm — AB (ref 0.6–1.1)
LV dias vol index: 30 mL/m2
LV dias vol: 53 mL (ref 46–106)
LV e' LATERAL: 7.72 cm/s
LV sys vol index: 12 mL/m2
LV sys vol: 21 mL
LVOT SV: 98 mL
LVOT VTI: 28.2 cm
LVOT area: 3.46 cm2
LVOT diameter: 21 mm
LVOT peak grad rest: 6 mmHg
LVOT peak vel: 118 cm/s
Lateral S' vel: 12.9 cm/s
RV sys press: 34 mmHg
Reg peak vel: 279 cm/s
Simpson's disk: 60
Stroke v: 32 ml
TAPSE: 19.5 mm
TDI e' lateral: 7.72
TDI e' medial: 6.85
TR max vel: 279 cm/s
Weight: 2419.77 oz

## 2017-03-17 LAB — BASIC METABOLIC PANEL
Anion gap: 6 (ref 5–15)
Anion gap: 6 (ref 5–15)
BUN: 39 mg/dL — ABNORMAL HIGH (ref 6–20)
BUN: 33 mg/dL — ABNORMAL HIGH (ref 6–20)
CO2: 24 mmol/L (ref 22–32)
CO2: 26 mmol/L (ref 22–32)
Calcium: 8.6 mg/dL — ABNORMAL LOW (ref 8.9–10.3)
Calcium: 8.6 mg/dL — ABNORMAL LOW (ref 8.9–10.3)
Chloride: 107 mmol/L (ref 101–111)
Chloride: 106 mmol/L (ref 101–111)
Creatinine, Ser: 1.6 mg/dL — ABNORMAL HIGH (ref 0.44–1.00)
Creatinine, Ser: 1.52 mg/dL — ABNORMAL HIGH (ref 0.44–1.00)
GFR calc Af Amer: 35 mL/min — ABNORMAL LOW (ref >60)
GFR calc Af Amer: 38 mL/min — ABNORMAL LOW (ref >60)
GFR calc non Af Amer: 30 mL/min — ABNORMAL LOW (ref >60)
GFR calc non Af Amer: 32 mL/min — ABNORMAL LOW (ref >60)
Glucose, Bld: 149 mg/dL — ABNORMAL HIGH (ref 65–99)
Glucose, Bld: 146 mg/dL — ABNORMAL HIGH (ref 65–99)
Potassium: 4.4 mmol/L (ref 3.5–5.1)
Potassium: 3.9 mmol/L (ref 3.5–5.1)
Sodium: 137 mmol/L (ref 135–145)
Sodium: 138 mmol/L (ref 135–145)

## 2017-03-17 LAB — URINALYSIS, ROUTINE W REFLEX MICROSCOPIC
Bilirubin Urine: NEGATIVE
Glucose, UA: NEGATIVE mg/dL
Ketones, ur: NEGATIVE mg/dL
Nitrite: POSITIVE — AB
Protein, ur: NEGATIVE mg/dL
Specific Gravity, Urine: 1.011 (ref 1.005–1.030)
pH: 5 (ref 5.0–8.0)

## 2017-03-17 LAB — C DIFFICILE QUICK SCREEN W PCR REFLEX
C Diff antigen: NEGATIVE
C Diff interpretation: NOT DETECTED
C Diff toxin: NEGATIVE

## 2017-03-17 LAB — I-STAT CG4 LACTIC ACID, ED: Lactic Acid, Venous: 1.06 mmol/L (ref 0.5–1.9)

## 2017-03-17 LAB — TROPONIN I
Troponin I: <0.03 ng/mL (ref <0.03)
Troponin I: <0.03 ng/mL (ref <0.03)
Troponin I: 0.08 ng/mL (ref <0.03)

## 2017-03-17 LAB — MRSA PCR SCREENING: MRSA by PCR: NEGATIVE

## 2017-03-17 LAB — TSH: TSH: 1.942 u[IU]/mL (ref 0.350–4.500)

## 2017-03-17 MED ORDER — LEVOTHYROXINE SODIUM 25 MCG PO TABS
50.0000 ug | ORAL_TABLET | Freq: Every day | ORAL | Status: DC
  Administered 2017-03-17 – 2017-03-19 (×3): 50 ug via ORAL
  Filled 2017-03-17 (×3): qty 2

## 2017-03-17 MED ORDER — HEPARIN SODIUM (PORCINE) 5000 UNIT/ML IJ SOLN
5000.0000 [IU] | Freq: Three times a day (TID) | INTRAMUSCULAR | Status: DC
  Administered 2017-03-17 – 2017-03-19 (×7): 5000 [IU] via SUBCUTANEOUS
  Filled 2017-03-17 (×6): qty 1

## 2017-03-17 MED ORDER — SODIUM CHLORIDE 0.9% FLUSH
3.0000 mL | Freq: Two times a day (BID) | INTRAVENOUS | Status: DC
  Administered 2017-03-17 – 2017-03-19 (×4): 3 mL via INTRAVENOUS

## 2017-03-17 MED ORDER — ACETAMINOPHEN 325 MG PO TABS
650.0000 mg | ORAL_TABLET | ORAL | Status: DC | PRN
  Administered 2017-03-17: 650 mg via ORAL
  Filled 2017-03-17: qty 2

## 2017-03-17 MED ORDER — ONDANSETRON HCL 4 MG PO TABS: 4.0000 mg | ORAL_TABLET | Freq: Four times a day (QID) | ORAL | Status: DC | PRN

## 2017-03-17 MED ORDER — DEXTROSE 5 % IV SOLN
1.0000 g | Freq: Once | INTRAVENOUS | Status: AC
  Administered 2017-03-17: 1 g via INTRAVENOUS
  Filled 2017-03-17: qty 10

## 2017-03-17 MED ORDER — SIMVASTATIN 20 MG PO TABS
40.0000 mg | ORAL_TABLET | Freq: Every day | ORAL | Status: DC
  Administered 2017-03-17 – 2017-03-18 (×2): 40 mg via ORAL
  Filled 2017-03-17 (×2): qty 2

## 2017-03-17 MED ORDER — ALPRAZOLAM 0.5 MG PO TABS: 0.5000 mg | ORAL_TABLET | Freq: Every evening | ORAL | Status: DC | PRN

## 2017-03-17 MED ORDER — OMEGA-3-ACID ETHYL ESTERS 1 G PO CAPS
2.0000 g | ORAL_CAPSULE | Freq: Every day | ORAL | Status: DC
Start: 2017-03-17 — End: 2017-03-19
  Administered 2017-03-17 – 2017-03-19 (×3): 2 g via ORAL
  Filled 2017-03-17 (×3): qty 2

## 2017-03-17 MED ORDER — DEXTROSE 5 % IV SOLN
1.0000 g | INTRAVENOUS | Status: DC
  Administered 2017-03-18 – 2017-03-19 (×2): 1 g via INTRAVENOUS
  Filled 2017-03-17 (×3): qty 10

## 2017-03-17 MED ORDER — DEXTROSE-NACL 5-0.9 % IV SOLN
INTRAVENOUS | Status: DC
  Administered 2017-03-17: 1000 mL via INTRAVENOUS
  Administered 2017-03-17: 05:00:00 via INTRAVENOUS

## 2017-03-17 MED ORDER — GABAPENTIN 100 MG PO CAPS
200.0000 mg | ORAL_CAPSULE | Freq: Three times a day (TID) | ORAL | Status: DC
Start: 2017-03-17 — End: 2017-03-19
  Administered 2017-03-17 – 2017-03-19 (×7): 200 mg via ORAL
  Filled 2017-03-17 (×7): qty 2

## 2017-03-17 MED ORDER — ONDANSETRON HCL 4 MG/2ML IJ SOLN: 4.0000 mg | Freq: Four times a day (QID) | INTRAMUSCULAR | Status: DC | PRN

## 2017-03-17 NOTE — Progress Notes (Signed)
Carol Rosario is an 76 y.o. female with hx of anxiety, hypothrydoisim, s/p hip surgery, CCY, presented to the ER with syncope, CT of the brain was done and it was negative.  She has an abdominal CT which showed no acute process, but there was incidentally a 2cm lesion on the right lobe of a liver, and ? Soft tissue mass in the perineum. Her UA was positive for UTI, and she was started on IV Rocephin.  Hospitalist was asked to admit her for syncopal work up, AKI likely due to volume depletion on diuretics, and abnormal abdominal pelvic CT.  PLAN: 1. Hold diuretics, ACE inhibitors and resume IVF fluids.  2. Rocephin for UTI 3. Follow up the MRI of the abdomen.   Hosie Poisson MD [phone]

## 2017-03-17 NOTE — H&P (Signed)
History and Physical    Carol Rosario:[id number] DOB: [birth year] DOA: 03/16/2017  PCP: Glo Herring, MD  Patient coming from: Home.    Chief Complaint:  Syncope.  HPI: Carol Rosario is an 76 y.o. female with hx of anxiety, hypothrydoisim, s/p hip surgery, CCY, presented to the ER with syncope.  She has been nauseated, but had no abdominal pain.  She had no chest pain, SOB, black or bloody stool.  She has been taking diuretics for her BP.  In the ER, she was found to be hypotensive with SBP in the 70's.  She responded to IVF bolus and felt better.  Evaluation in the ER with EKG showing SB at 48, with bifascicular blocks, and T wave inversions.  Her Cr was elevated at 1.99.  As she may have "bumped" her head with her fall, a CT of the brain was done and it was negative.  She has an abdominal CT which showed no acute process, but there was incidentally a 2cm lesion on the right lobe of a liver, and ? Soft tissue mass in the perieum.  Her UA was positive for UTI, and she was started on IV Rocephin.  Stool was checked for C diff and it was negative.  Hospitalist was asked to admit her for syncopal work up, AKI likely due to volume depletion on diuretics, and abnormal abdominal pelvic CT.    ED Course:  See above.  Rewiew of Systems:  Constitutional: Negative for malaise, fever and chills. No significant weight loss or weight gain Eyes: Negative for eye pain, redness and discharge, diplopia, visual changes, or flashes of light. ENMT: Negative for ear pain, hoarseness, nasal congestion, sinus pressure and sore throat. No headaches; tinnitus, drooling, or problem swallowing. Cardiovascular: Negative for chest pain, palpitations, diaphoresis, dyspnea and peripheral edema. ; No orthopnea, PND Respiratory: Negative for cough, hemoptysis, wheezing and stridor. No pleuritic chestpain. Gastrointestinal: Negative for  constipation,  melena, blood in stool, hematemesis, jaundice and rectal bleeding.      Genitourinary: Negative for frequency, dysuria, incontinence,flank pain and hematuria; Musculoskeletal: Negative for back pain and neck pain. Negative for swelling and trauma.;  Skin: . Negative for pruritus, rash, abrasions, bruising and skin lesion.; ulcerations Neuro: Negative for headache, lightheadedness and neck stiffness. Negative for  extremity weakness, burning feet, involuntary movement, seizure and syncope.  Psych: negative for anxiety, depression, insomnia, tearfulness, panic attacks, hallucinations, paranoia, suicidal or homicidal ideation    Past Medical History:  Diagnosis Date  . Anxiety   . Cancer ([place]) 08/2012   colon cancer  . Degeneration of lumbar or lumbosacral intervertebral disc   . DJD (degenerative joint disease)   . Family history of tobacco abuse and dependence   . History of recurrent UTIs   . Hyperlipidemia   . Hypertension   . Hypothyroidism   . Obesity   . Palpitations   . PONV (postoperative nausea and vomiting)   . Sciatica     Past Surgical History:  Procedure Laterality Date  . bilateral tubal ligation    . bladder resuspension    . CATARACT EXTRACTION, BILATERAL    . CHOLECYSTECTOMY    . FLEXIBLE SIGMOIDOSCOPY  08/26/2012   Procedure: FLEXIBLE SIGMOIDOSCOPY;  Surgeon: Daneil Dolin, MD;  Location: [facility];  Service: Endoscopy;  Laterality: N/A;  . HIP SURGERY  2011   left, [facility]  . KNEE SURGERY     left arthroscopy  . PARTIAL COLECTOMY  09/13/2012   Procedure: PARTIAL  COLECTOMY;  Surgeon: Jamesetta So, MD;  Location: [facility];  Service: General;  Laterality: N/A;  . TOTAL HIP ARTHROPLASTY Right 05/02/2013   Procedure: TOTAL HIP ARTHROPLASTY;  Surgeon: Carole Civil, MD;  Location: [facility];  Service: Orthopedics;  Laterality: Right;  Right Total Hip Arthroplasty  . TUBAL LIGATION    . VESICOVAGINAL FISTULA CLOSURE W/ TAH       reports that she quit smoking about 6 years ago. Her smoking use included Cigarettes. She has never used  smokeless tobacco. She reports that she does not drink alcohol or use drugs.  Allergies  Allergen Reactions  . Codeine Shortness Of Breath and Itching    Family History  Problem Relation Age of Onset  . Arthritis    . Cancer    . Diabetes    . Heart disease Neg Hx      Prior to Admission medications   Medication Sig Start Date End Date Taking? Authorizing Provider  ALPRAZolam Duanne Moron) 0.5 MG tablet Take 0.5 mg by mouth at bedtime as needed. Sleep 06/21/12  Yes Historical Provider, MD  gabapentin (NEURONTIN) 100 MG capsule Take 1 capsule (100 mg total) by mouth 3 (three) times daily. Patient taking differently: Take 200 mg by mouth 3 (three) times daily.  03/15/13  Yes Carole Civil, MD  HYDROcodone-acetaminophen ([place]) 7.5-325 MG per tablet Take 1 tablet by mouth every 6 (six) hours as needed for moderate pain.   Yes Historical Provider, MD  aspirin EC 325 MG EC tablet Take 1 tablet (325 mg total) by mouth 2 (two) times daily. 05/05/13   Carole Civil, MD  Dextromethorphan-Guaifenesin (ROBITUSSIN DM) 10-100 MG/5ML liquid Take 5 mLs by mouth every 12 (twelve) hours. 12/23/13   Lendon Colonel, NP  fish oil-omega-3 fatty acids 1000 MG capsule Take 2 g by mouth daily.    Historical Provider, MD  levothyroxine (SYNTHROID, LEVOTHROID) 50 MCG tablet Take 50 mcg by mouth daily.    Historical Provider, MD  lisinopril-hydrochlorothiazide (PRINZIDE,ZESTORETIC) 20-12.5 MG per tablet Take 1 tablet by mouth daily.    Historical Provider, MD  methocarbamol (ROBAXIN) 500 MG tablet Take 1 tablet (500 mg total) by mouth every 6 (six) hours as needed. 11/03/13   Carole Civil, MD  oseltamivir (TAMIFLU) 75 MG capsule Take 1 capsule (75 mg total) by mouth 2 (two) times daily. For 5 days 12/23/13   Lendon Colonel, NP  oxyCODONE-acetaminophen (PERCOCET/ROXICET) 5-325 MG per tablet Take 1 tablet by mouth every 4 (four) hours. 06/21/13   Carole Civil, MD  Probiotic Product ([place]) CAPS Take 1 capsule by mouth every other day.    Historical Provider, MD  ZOCOR 40 MG tablet TAKE ONE TABLET BY MOUTH AT BEDTIME. 11/26/12   Lendon Colonel, NP    Physical Exam: Vitals:   03/17/17 0130 03/17/17 0200 03/17/17 0202 03/17/17 0230  BP: (!) 88/62 (!) 85/56 (!) 104/53 (!) 93/53  Pulse: (!) 57 (!) 57 (!) 57 (!) 57  Resp: 13 15 14 14   Temp:      TempSrc:      SpO2: 96% 94% 94% 95%  Weight:      Height:          Constitutional: NAD, calm, comfortable Vitals:   03/17/17 0130 03/17/17 0200 03/17/17 0202 03/17/17 0230  BP: (!) 88/62 (!) 85/56 (!) 104/53 (!) 93/53  Pulse: (!) 57 (!) 57 (!) 57 (!) 57  Resp: 13 15 14 14   Temp:  TempSrc:      SpO2: 96% 94% 94% 95%  Weight:      Height:       Eyes: PERRL, lids and conjunctivae normal ENMT: Mucous membranes are moist. Posterior pharynx clear of any exudate or lesions.Normal dentition.  Neck: normal, supple, no masses, no thyromegaly Respiratory: clear to auscultation bilaterally, no wheezing, no crackles. Normal respiratory effort. No accessory muscle use.  Cardiovascular: Regular rate and rhythm, no murmurs / rubs / gallops. No extremity edema. 2+ pedal pulses. No carotid bruits.  Abdomen: no tenderness, no masses palpated. No hepatosplenomegaly. Bowel sounds positive.  Musculoskeletal: no clubbing / cyanosis. No joint deformity upper and lower extremities. Good ROM, no contractures. Normal muscle tone.  Skin: no rashes, lesions, ulcers. No induration Neurologic: CN 2-12 grossly intact. Sensation intact, DTR normal. Strength 5/5 in all 4.  Psychiatric: Normal judgment and insight. Alert and oriented x 3. Normal mood.     Labs on Admission: I have personally reviewed following labs and imaging studies  CBC:  Recent Labs Lab 03/16/17 2247  WBC 13.4*  NEUTROABS 10.9*  HGB 12.0  HCT 37.0  MCV 91.1  PLT 884*   Basic Metabolic Panel:  Recent Labs Lab 03/16/17 2247  NA 135  K 4.0  CL 103  CO2  25  GLUCOSE 142*  BUN 48*  CREATININE 1.99*  CALCIUM 9.0   GFR: Estimated Creatinine Clearance: 23.4 mL/min (A) (by C-G formula based on SCr of 1.99 mg/dL (H)). Liver Function Tests:  Recent Labs Lab 03/16/17 2247  AST 22  ALT 15  ALKPHOS 43  BILITOT 0.2*  PROT 6.4*  ALBUMIN 3.8   Urine analysis:    Component Value Date/Time   COLORURINE YELLOW 03/16/2017 2353   APPEARANCEUR HAZY (A) 03/16/2017 2353   LABSPEC 1.011 03/16/2017 2353   PHURINE 5.0 03/16/2017 2353   GLUCOSEU NEGATIVE 03/16/2017 2353   HGBUR SMALL (A) 03/16/2017 2353   BILIRUBINUR NEGATIVE 03/16/2017 2353   KETONESUR NEGATIVE 03/16/2017 2353   PROTEINUR NEGATIVE 03/16/2017 2353   NITRITE POSITIVE (A) 03/16/2017 2353   LEUKOCYTESUR TRACE (A) 03/16/2017 2353    Recent Results (from the past 240 hour(s))  C difficile quick scan w PCR reflex     Status: None   Collection Time: 03/17/17 12:33 AM  Result Value Ref Range Status   C Diff antigen NEGATIVE NEGATIVE Final   C Diff toxin NEGATIVE NEGATIVE Final   C Diff interpretation No C. difficile detected.  Final     Radiological Exams on Admission: Ct Abdomen Pelvis Wo Contrast  Result Date: 03/17/2017 CLINICAL DATA:  Stomach cramps, nausea vomiting and diarrhea EXAM: CT ABDOMEN AND PELVIS WITHOUT CONTRAST TECHNIQUE: Multidetector CT imaging of the abdomen and pelvis was performed following the standard protocol without IV contrast. COMPARISON:  None. FINDINGS: Lower chest: Lung bases demonstrate scarring in the right lower lobe. Irregular density in the lingula, suspect scarring. No pleural effusion. Nonenlarged heart. Hepatobiliary: 2.2 cm low-density lesion in the right hepatic lobe. No other focal hepatic abnormalities are seen. Surgical absence of the gallbladder. No biliary dilatation. Pancreas: Coarse pancreatic calcification.  No inflammation. Spleen: Normal in size without focal abnormality. Adrenals/Urinary Tract: Adrenal glands are within normal limits.  No hydronephrosis. The bladder is unremarkable but obscured by artifact. Stomach/Bowel: No dilated small bowel. The stomach contains debris postsurgical changes of the sigmoid colon. No wall thickening. Appendix not well identified but no right lower quadrant inflammation Vascular/Lymphatic: Atherosclerosis. No aneurysmal dilatation. No grossly enlarged lymph nodes Reproductive:  Status post hysterectomy. No adnexal masses. Rectovaginal soft tissue fullness with air in the vagina. Other: No free air. No significant free fluid. Fat containing infraumbilical ventral hernia. Musculoskeletal: Degenerative changes. No acute or suspicious bone lesion. IMPRESSION: 1. Negative for bowel obstruction or acute inflammatory process 2. 2.2 cm indeterminate hypodense lesion in the right hepatic lobe ; further evaluation with contrast-enhanced CT or MRI may be obtained on a nonemergent basis. 3. Small fat containing ventral hernia. 4. Rectovaginal soft tissue fullness with small amount of air in the vagina, correlate with physical exam. Electronically Signed   By: Donavan Foil M.D.   On: 03/17/2017 00:50   Ct Head Wo Contrast  Result Date: 03/17/2017 CLINICAL DATA:  Sudden LOC after a fall EXAM: CT HEAD WITHOUT CONTRAST TECHNIQUE: Contiguous axial images were obtained from the base of the skull through the vertex without intravenous contrast. COMPARISON:  None. FINDINGS: Brain: No acute territorial infarction, hemorrhage, or focal mass lesion is visualized. There is moderate periventricular, subcortical and deep white matter hypodensity consistent with small vessel ischemia. Mild to moderate atrophy. The ventricles are nonenlarged. Vascular: No hyperdense vessels. There are carotid artery calcifications. Skull: No fracture. Small amount of fluid in the inferior left mastoid. No suspicious bone lesion. Sinuses/Orbits: No acute orbital abnormality. Minimal mucosal thickening in the sphenoid and ethmoid sinuses. Other: None  IMPRESSION: No CT evidence for acute intracranial abnormality. Moderate white matter small vessel ischemic changes. Electronically Signed   By: Donavan Foil M.D.   On: 03/17/2017 00:40    EKG: Independently reviewed.   Assessment/Plan Active Problems:   Abnormal abdominal CT scan   Acute lower UTI   Dehydration   AKI (acute kidney injury) ([facility])   Bradycardia   Bifascicular block   Syncope and collapse    PLAN:   Syncope:  I suspect she has vagal reaction, in addition to having volume depletion due to diarrhea and being on diuretics.  So will hold diuretic and BP meds, give IVF and follow.  Her bradycardia and bifascicular block in the setting of syncope on no betablocker is concerning.  Therefore, will consult cardiology and obtain ECHO.  Check TSH.  AKI:  Prerenal on duretic and ACE I.  Will give IVF.  Follow Cr, and hold Zesteretics.  UTI:  Continue with IV Rocephin.  Abnormal abdominal CT:  A rectovaginal soft tissue mass and a 2.2 cm liver lesion.  Will proceed with MRI of the abdomen.  Will need to follow up with these findings.  I told her about these.   DVT prophylaxis: sub Q heparin.  Code Status: FULL CODE.  Family Communication: None.  Disposition Plan: Home.  Consults called: None. Admission status: Inpatient   Lilli Dewald MD FACP. [facility]  If 7PM-7AM, please contact night-coverage [email or web address] Password [email or web address]  03/17/2017, 2:46 AM

## 2017-03-17 NOTE — Care Management Note (Signed)
Case Management Note  Patient Details  Name: Carol Rosario MRN: [id number] Date of Birth: 1941/06/29  Subjective/Objective:  Adm with UTI/abnormal CT scan. Her son lives with her. She is ind with ADL's. Uses a cane at times and also has RW and shower seat. She has PCP, transportation provided by family to her appointments, and reports no issues affording medications.                   Action/Plan: Anticipate DC home with self care.    Expected Discharge Date:       03/18/2017           Expected Discharge Plan:  Home/Self Care  In-House Referral:     Discharge planning Services  CM Consult  Post Acute Care Choice:    Choice offered to:     DME Arranged:    DME Agency:     HH Arranged:    HH Agency:     Status of Service:  In process, will continue to follow  If discussed at Long Length of Stay Meetings, dates discussed:    Additional Comments:  Asim Gersten, Chauncey Reading, RN 03/17/2017, 2:09 PM

## 2017-03-17 NOTE — Progress Notes (Signed)
*  PRELIMINARY RESULTS* Echocardiogram 2D Echocardiogram has been performed.  Samuel Germany 03/17/2017, 2:45 PM

## 2017-03-18 ENCOUNTER — Encounter: Payer: Self-pay | Admitting: *Deleted

## 2017-03-18 ENCOUNTER — Encounter (HOSPITAL_COMMUNITY): Payer: Self-pay | Admitting: Physician Assistant

## 2017-03-18 DIAGNOSIS — E860 Dehydration: Secondary | ICD-10-CM

## 2017-03-18 DIAGNOSIS — R55 Syncope and collapse: Principal | ICD-10-CM

## 2017-03-18 DIAGNOSIS — I452 Bifascicular block: Secondary | ICD-10-CM

## 2017-03-18 DIAGNOSIS — I959 Hypotension, unspecified: Secondary | ICD-10-CM

## 2017-03-18 DIAGNOSIS — N179 Acute kidney failure, unspecified: Secondary | ICD-10-CM

## 2017-03-18 DIAGNOSIS — R001 Bradycardia, unspecified: Secondary | ICD-10-CM

## 2017-03-18 DIAGNOSIS — I9589 Other hypotension: Secondary | ICD-10-CM

## 2017-03-18 DIAGNOSIS — R935 Abnormal findings on diagnostic imaging of other abdominal regions, including retroperitoneum: Secondary | ICD-10-CM

## 2017-03-18 DIAGNOSIS — N390 Urinary tract infection, site not specified: Secondary | ICD-10-CM

## 2017-03-18 LAB — BASIC METABOLIC PANEL
Anion gap: 5 (ref 5–15)
BUN: 22 mg/dL — ABNORMAL HIGH (ref 6–20)
CO2: 27 mmol/L (ref 22–32)
Calcium: 8.5 mg/dL — ABNORMAL LOW (ref 8.9–10.3)
Chloride: 109 mmol/L (ref 101–111)
Creatinine, Ser: 1.35 mg/dL — ABNORMAL HIGH (ref 0.44–1.00)
GFR calc Af Amer: 43 mL/min — ABNORMAL LOW (ref >60)
GFR calc non Af Amer: 37 mL/min — ABNORMAL LOW (ref >60)
Glucose, Bld: 102 mg/dL — ABNORMAL HIGH (ref 65–99)
Potassium: 4.2 mmol/L (ref 3.5–5.1)
Sodium: 141 mmol/L (ref 135–145)

## 2017-03-18 LAB — GASTROINTESTINAL PANEL BY PCR, STOOL (REPLACES STOOL CULTURE)

## 2017-03-18 LAB — TROPONIN I: Troponin I: 0.05 ng/mL (ref <0.03)

## 2017-03-18 MED ORDER — DEXTROSE-NACL 5-0.9 % IV SOLN
INTRAVENOUS | Status: DC
  Administered 2017-03-18: 15:00:00 via INTRAVENOUS

## 2017-03-18 MED ORDER — GUAIFENESIN-DM 100-10 MG/5ML PO SYRP
5.0000 mL | ORAL_SOLUTION | ORAL | Status: DC | PRN
  Administered 2017-03-18 (×2): 5 mL via ORAL
  Filled 2017-03-18 (×2): qty 5

## 2017-03-18 NOTE — Consult Note (Signed)
Cardiology Consultation Note    Patient ID: Carol Rosario, MRN: [id number], DOB/AGE: 05-16-1941 76 y.o. Admit date: 03/16/2017   Date of Consult: 03/18/2017 Primary Physician: Glo Herring, MD Primary Cardiologist: Dr. Bronson Ing  Chief Complaint: passed out Reason for Consultation: syncope, bifascicular block Requesting MD: Dr. Marin Comment  HPI: Carol Rosario is a 76 y.o. female who is being seen today for the evaluation of syncope at the request of Dr. Marin Comment. The patient has a history of anxiety, hypothyroidism, colon CA, HTN, hyperlipidemia, former smoker, bifascicular block, remote h/o syncope 2008. She's previously seen Dr. Bronson Ing for BP management.  Yesterday she ate some coleslaw - she says she usually feels sick after doing so with some abdominal pain and nausea. In the evening after eating dinner she began to feel nauseated with adominal cramping, dizziness, and sweating. Her son helped her to the bathroom where she vomited several times. She also had diarrhea. He helped her back to the bed. She then got up to go to the bathroom again where she got dizzy and the next thing she knew she was on the floor and EMS had arrived. Per report she was out about 10 seconds. She was noted to be hypotensive and received 1 L of saline upon transport. Initial BP 85/42 in the ED and HR in the 50s. She denies any chest pain, palpitations, bleeding, LEE. EKG shows known bifascicular block. Telemetry shows NSR with occasional PVCs, brief bradycardia attributed to the PVCs overnight (HR 40s-50s). She ambulated with PT with HR response into the 90s and no further dizziness.  Antihypertensives have been held. Workup notable for BUN/Cr 48/1.99->1.3, trop neg x 3 then 0.08-0.05, WBC 13.4, + UTI. CT abd pelvis showed rectovaginal soft tissue fullness with small amount of air in the vagina, 2.2cm indeterminate hypodense lesion in the right hepatic lobe; further evaluation with contrast-enhanced CT or MRI may be obtained  on a nonemergent basis. CT head nonacute, moderate white matter small vessel ischemic changes. 2D echo 03/17/17: mild focal basal hypertrophy of septum, EF 45-80%, normal diastolic parameters, PASP 34, no sig valvular disease.   Past Medical History:  Diagnosis Date  . Anxiety   . Bifascicular block   . Cancer ([place]) 08/2012   colon cancer  . Degeneration of lumbar or lumbosacral intervertebral disc   . DJD (degenerative joint disease)   . Family history of tobacco abuse and dependence   . History of recurrent UTIs   . Hyperlipidemia   . Hypertension   . Hypothyroidism   . Obesity   . Palpitations   . PONV (postoperative nausea and vomiting)   . Sciatica   . Syncope 2008      Surgical History:  Past Surgical History:  Procedure Laterality Date  . bilateral tubal ligation    . bladder resuspension    . CATARACT EXTRACTION, BILATERAL    . CHOLECYSTECTOMY    . FLEXIBLE SIGMOIDOSCOPY  08/26/2012   Procedure: FLEXIBLE SIGMOIDOSCOPY;  Surgeon: Daneil Dolin, MD;  Location: [facility];  Service: Endoscopy;  Laterality: N/A;  . HIP SURGERY  2011   left, [facility]  . KNEE SURGERY     left arthroscopy  . PARTIAL COLECTOMY  09/13/2012   Procedure: PARTIAL COLECTOMY;  Surgeon: Jamesetta So, MD;  Location: [facility];  Service: General;  Laterality: N/A;  . TOTAL HIP ARTHROPLASTY Right 05/02/2013   Procedure: TOTAL HIP ARTHROPLASTY;  Surgeon: Carole Civil, MD;  Location: [facility];  Service: Orthopedics;  Laterality: Right;  Right Total Hip Arthroplasty  . TUBAL LIGATION    . VESICOVAGINAL FISTULA CLOSURE W/ TAH       Home Meds: Prior to Admission medications   Medication Sig Start Date End Date Taking? Authorizing Provider  ALPRAZolam Duanne Moron) 0.5 MG tablet Take 0.5 mg by mouth at bedtime as needed. Sleep 06/21/12  Yes Historical Provider, MD  aspirin EC 325 MG EC tablet Take 1 tablet (325 mg total) by mouth 2 (two) times daily. 05/05/13  Yes Carole Civil, MD  gabapentin (NEURONTIN)  100 MG capsule Take 1 capsule (100 mg total) by mouth 3 (three) times daily. Patient taking differently: Take 200 mg by mouth 3 (three) times daily.  03/15/13  Yes Carole Civil, MD  HYDROcodone-acetaminophen ([place]) 7.5-325 MG per tablet Take 1 tablet by mouth every 6 (six) hours as needed for moderate pain.   Yes Historical Provider, MD  levothyroxine (SYNTHROID, LEVOTHROID) 50 MCG tablet Take 50 mcg by mouth daily.   Yes Historical Provider, MD  lisinopril-hydrochlorothiazide (PRINZIDE,ZESTORETIC) 20-12.5 MG per tablet Take 1 tablet by mouth daily.   Yes Historical Provider, MD  Dextromethorphan-Guaifenesin (ROBITUSSIN DM) 10-100 MG/5ML liquid Take 5 mLs by mouth every 12 (twelve) hours. 12/23/13   Lendon Colonel, NP  fish oil-omega-3 fatty acids 1000 MG capsule Take 2 g by mouth daily.    Historical Provider, MD  methocarbamol (ROBAXIN) 500 MG tablet Take 1 tablet (500 mg total) by mouth every 6 (six) hours as needed. 11/03/13   Carole Civil, MD  oseltamivir (TAMIFLU) 75 MG capsule Take 1 capsule (75 mg total) by mouth 2 (two) times daily. For 5 days Patient not taking: Reported on 03/17/2017 12/23/13   Lendon Colonel, NP  Probiotic Product ([place]) CAPS Take 1 capsule by mouth every other day.    Historical Provider, MD    Inpatient Medications:  . cefTRIAXone (ROCEPHIN)  IV  1 g Intravenous Q24H  . gabapentin  200 mg Oral TID  . heparin  5,000 Units Subcutaneous Q8H  . levothyroxine  50 mcg Oral QAC breakfast  . omega-3 acid ethyl esters  2 g Oral Daily  . simvastatin  40 mg Oral QHS  . sodium chloride flush  3 mL Intravenous Q12H   . dextrose 5 % and 0.9% NaCl 100 mL/hr at 03/18/17 0600    Allergies:  Allergies  Allergen Reactions  . Codeine Shortness Of Breath and Itching    Social History   Social History  . Marital status: Widowed    Spouse name: N/A  . Number of children: N/A  . Years of education: N/A   Occupational History  . Not on file.    Social History Main Topics  . Smoking status: Former Smoker    Types: Cigarettes    Quit date: 08/13/2010  . Smokeless tobacco: Never Used  . Alcohol use No  . Drug use: No  . Sexual activity: Yes    Birth control/ protection: Surgical   Other Topics Concern  . Not on file   Social History Narrative  . No narrative on file     Family History  Problem Relation Age of Onset  . Arthritis    . Cancer    . Diabetes    . Heart disease Neg Hx      Review of Systems: All other systems reviewed and are otherwise negative except as noted above.  Labs:  Recent Labs  03/17/17 0423 03/17/17 1006 03/17/17 1549  03/18/17 0450  TROPONINI <0.03 <0.03 0.08* 0.05*   Lab Results  Component Value Date   WBC 13.4 (H) 03/16/2017   HGB 12.0 03/16/2017   HCT 37.0 03/16/2017   MCV 91.1 03/16/2017   PLT 146 (L) 03/16/2017    Recent Labs Lab 03/16/17 2247  03/18/17 0450  NA 135  < > 141  K 4.0  < > 4.2  CL 103  < > 109  CO2 25  < > 27  BUN 48*  < > 22*  CREATININE 1.99*  < > 1.35*  CALCIUM 9.0  < > 8.5*  PROT 6.4*  --   --   BILITOT 0.2*  --   --   ALKPHOS 43  --   --   ALT 15  --   --   AST 22  --   --   GLUCOSE 142*  < > 102*  < > = values in this interval not displayed. Lab Results  Component Value Date   CHOL 243 (H) 05/24/2012   HDL 39 (L) 05/24/2012   LDLCALC 146 (H) 05/24/2012   TRIG 288 (H) 05/24/2012   No results found for: DDIMER  Radiology/Studies:  Ct Abdomen Pelvis Wo Contrast  Result Date: 03/17/2017 CLINICAL DATA:  Stomach cramps, nausea vomiting and diarrhea EXAM: CT ABDOMEN AND PELVIS WITHOUT CONTRAST TECHNIQUE: Multidetector CT imaging of the abdomen and pelvis was performed following the standard protocol without IV contrast. COMPARISON:  None. FINDINGS: Lower chest: Lung bases demonstrate scarring in the right lower lobe. Irregular density in the lingula, suspect scarring. No pleural effusion. Nonenlarged heart. Hepatobiliary: 2.2 cm low-density  lesion in the right hepatic lobe. No other focal hepatic abnormalities are seen. Surgical absence of the gallbladder. No biliary dilatation. Pancreas: Coarse pancreatic calcification.  No inflammation. Spleen: Normal in size without focal abnormality. Adrenals/Urinary Tract: Adrenal glands are within normal limits. No hydronephrosis. The bladder is unremarkable but obscured by artifact. Stomach/Bowel: No dilated small bowel. The stomach contains debris postsurgical changes of the sigmoid colon. No wall thickening. Appendix not well identified but no right lower quadrant inflammation Vascular/Lymphatic: Atherosclerosis. No aneurysmal dilatation. No grossly enlarged lymph nodes Reproductive: Status post hysterectomy. No adnexal masses. Rectovaginal soft tissue fullness with air in the vagina. Other: No free air. No significant free fluid. Fat containing infraumbilical ventral hernia. Musculoskeletal: Degenerative changes. No acute or suspicious bone lesion. IMPRESSION: 1. Negative for bowel obstruction or acute inflammatory process 2. 2.2 cm indeterminate hypodense lesion in the right hepatic lobe ; further evaluation with contrast-enhanced CT or MRI may be obtained on a nonemergent basis. 3. Small fat containing ventral hernia. 4. Rectovaginal soft tissue fullness with small amount of air in the vagina, correlate with physical exam. Electronically Signed   By: Donavan Foil M.D.   On: 03/17/2017 00:50   Ct Head Wo Contrast  Result Date: 03/17/2017 CLINICAL DATA:  Sudden LOC after a fall EXAM: CT HEAD WITHOUT CONTRAST TECHNIQUE: Contiguous axial images were obtained from the base of the skull through the vertex without intravenous contrast. COMPARISON:  None. FINDINGS: Brain: No acute territorial infarction, hemorrhage, or focal mass lesion is visualized. There is moderate periventricular, subcortical and deep white matter hypodensity consistent with small vessel ischemia. Mild to moderate atrophy. The ventricles  are nonenlarged. Vascular: No hyperdense vessels. There are carotid artery calcifications. Skull: No fracture. Small amount of fluid in the inferior left mastoid. No suspicious bone lesion. Sinuses/Orbits: No acute orbital abnormality. Minimal mucosal thickening in the sphenoid and  ethmoid sinuses. Other: None IMPRESSION: No CT evidence for acute intracranial abnormality. Moderate white matter small vessel ischemic changes. Electronically Signed   By: Donavan Foil M.D.   On: 03/17/2017 00:40    Wt Readings from Last 3 Encounters:  03/18/17 156 lb 8.4 oz (71 kg)  05/10/15 162 lb (73.5 kg)  05/04/14 149 lb (67.6 kg)    EKG: sinus bradycardia 58bpm RBBB LAFB with nonspecific ST-T changes, no acute change from prior  Physical Exam: Blood pressure 133/80, pulse 72, temperature 99.8 F (37.7 C), temperature source Oral, resp. rate 17, height 5\' 4"  (1.626 m), weight 156 lb 8.4 oz (71 kg), SpO2 93 %. Body mass index is 26.87 kg/m. General: Well developed, well nourished WF, in no acute distress. Head: Normocephalic, atraumatic, sclera non-icteric, no xanthomas, nares are without discharge.  Neck: Negative for carotid bruits. JVD not elevated. Lungs: Clear bilaterally to auscultation without wheezes, rales, or rhonchi. Breathing is unlabored. Heart: RRR with S1 S2. No murmurs, rubs, or gallops appreciated. Abdomen: Soft, non-tender, non-distended with normoactive bowel sounds. No hepatomegaly. No rebound/guarding. No obvious abdominal masses. Msk:  Strength and tone appear normal for age. Extremities: No clubbing or cyanosis. No edema.  Distal pedal pulses are 2+ and equal bilaterally. Neuro: Alert and oriented X 3. No facial asymmetry. No focal deficit. Moves all extremities spontaneously. Psych:  Responds to questions appropriately with a normal affect.     Assessment and Plan  88F with  anxiety, hypothyroidism, colon CA, HTN, hyperlipidemia, former smoker, bifascicular block, remote h/o  syncope 2008. Felt sick after eating dinner with nausea, vomiting, diarrhea, dizziness and sweating - had subsequent syncope. Was found to be hypotensive and mildly bradycardic with continued bifascicular block on EKG.  1. Syncope in the setting of hypotension and mild bradycardia - suspect primarily secondary to hypotension in the setting of volume depletion and GI symptoms. Symptoms improved with holding of antihypertensives. Bifascicular block is known from prior. Avoid AVN blocking agents. TSH wnl. Will review with MD, but suspect an event monitor at discharge is prudent. Continue to follow on telemetry.  2. Elevated troponin - suspect related to demand process rather than an ACS. She's not had any CP or dyspnea. 2D Echo shows no RWMA. Will review with Dr. Bronson Ing.  3. AKI - supports diagnosis of volume depletion. Improving.  4. Possible soft tissue mass of perineum - further workup per IM. Last note mentions MRI but do not see this has been ordered yet.  5. UTI - per IM. Low grade temp elevation noted this AM.  Signed, Charlie Pitter PA-C 03/18/2017, 9:48 AM Pager: [phone]  The patient was seen and examined, and I agree with the history, physical exam, assessment and plan as documented above, with modifications as noted below. Pt admitted with syncope after reaction to cole slaw consumption resulting in profuse vomiting and diarrhea. Hypotensive on admission. 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 BUN 48, creatinine 1.99 on admission, down to 22 and 1.35 respectively after IV fluids. Denies chest pain and shortness of breath at present.  Syncope likely vagally mediated due to volume depletion/dehydration. Elevated BUN/Creatinine which is normalizing after IV fluids supports this.  I do not feel this was arrhythmia related. As stated above, RBBB and LAFB are old.  For this reason, I do not feel event monitoring is indicated at this time but could be considered in outpatient  setting. Mild troponin elevation is related to demand ischemia.  I will arrange for outpatient  follow up. Will sign off.  Kate Sable, MD, [facility]  03/18/2017 12:03 PM

## 2017-03-18 NOTE — Evaluation (Signed)
Physical Therapy Evaluation Patient Details Name: Carol Rosario MRN: [id number] DOB: Jul 14, 1941 Today's Date: 03/18/2017   History of Present Illness   76 y.o. female with hx of anxiety, hypothrydoisim, s/p hip surgery, CCY, presented to the ER with syncope.  She has been nauseated, but had no abdominal pain.  She had no chest pain, SOB, black or bloody stool.  She has been taking diuretics for her BP.  In the ER, she was found to be hypotensive with SBP in the 70's.  She responded to IVF bolus and felt better.  Evaluation in the ER with EKG showing SB at 48, with bifascicular blocks, and T wave inversions.  Her Cr was elevated at 1.99.  As she may have "bumped" her head with her fall, a CT of the brain was done and it was negative.  She has an abdominal CT which showed no acute process, but there was incidentally a 2cm lesion on the right lobe of a liver, and ? Soft tissue mass in the perieum.  Her UA was positive for UTI, and she was started on IV Rocephin.  Stool was checked for C diff and it was negative.  Hospitalist was asked to admit her for syncopal work up, AKI likely due to volume depletion on diuretics, and abnormal abdominal pelvic CT.      Clinical Impression  Pt received in bed, and was agreeable to PT evaluation.  Pt normally ambulates unlimited community distances with cane, and she is independent with dressing and bathing.  During PT evaluation, orthostatic vitas were obtained as follows:  Supine: 122/96, HR: 71bpm Sitting: 121/96, HR: 77bpm Standing: 127/66, HR: 77bpm After ambulation: 133/80, HR: 72bpm  Pt had no c/o dizziness during PT evaluation, and she demonstrates all functional mobility at modified independent level.  She was able to ambulate 257ft with RW and Mod (I) with HR ranging from 64bpm - 92bpm.  At this point, pt is functioning at her baseline level of function, and does not demonstrate need for skilled PT, therefore will sign off.  No follow up PT needs.       Follow Up Recommendations No PT follow up    Equipment Recommendations  None recommended by PT    Recommendations for Other Services       Precautions / Restrictions Precautions Precautions: Fall Precaution Comments: syncope and passed out in the bathroom just before admission - pt states she does not recall getting into the bathroom.   Restrictions Weight Bearing Restrictions: No      Mobility  Bed Mobility Overal bed mobility: Modified Independent                Transfers Overall transfer level: Modified independent                  Ambulation/Gait Ambulation/Gait assistance: Modified independent (Device/Increase time) Ambulation Distance (Feet): 200 Feet Assistive device: Rolling walker (2 wheeled) Gait Pattern/deviations: WFL(Within Functional Limits)     General Gait Details: HR ranged between 64bpm - 92bpm during ambulation  Stairs            Wheelchair Mobility    Modified Rankin (Stroke Patients Only)       Balance Overall balance assessment: History of Falls;Needs assistance Sitting-balance support: Bilateral upper extremity supported;Feet supported Sitting balance-Leahy Scale: Good     Standing balance support: Bilateral upper extremity supported Standing balance-Leahy Scale: Fair  Pertinent Vitals/Pain Pain Assessment: No/denies pain    Home Living   Living Arrangements: Children (son) Available Help at Discharge: Available 24 hours/day Type of Home: House Home Access: Stairs to enter;Ramped entrance   Entrance Stairs-Number of Steps: steps in the front and ramp in the back.  Home Layout: One level Home Equipment: Walker - 2 wheels;Cane - single point;Bedside commode;Shower seat      Prior Function     Gait / Transfers Assistance Needed: Pt ambualtes with cane.  Pt doesn't drive, son also does not drive, so they have to get a ride to run errands.  unlimited community  ambulation with cane  ADL's / Homemaking Assistance Needed: independent        Hand Dominance   Dominant Hand: Right    Extremity/Trunk Assessment   Upper Extremity Assessment Upper Extremity Assessment: Overall WFL for tasks assessed    Lower Extremity Assessment Lower Extremity Assessment: Overall WFL for tasks assessed       Communication   Communication: No difficulties  Cognition Arousal/Alertness: Awake/alert Behavior During Therapy: WFL for tasks assessed/performed Overall Cognitive Status: Within Functional Limits for tasks assessed                                        General Comments      Exercises     Assessment/Plan    PT Assessment Patent does not need any further PT services  PT Problem List         PT Treatment Interventions      PT Goals (Current goals can be found in the Care Plan section)  Acute Rehab PT Goals Patient Stated Goal: To go home PT Goal Formulation: All assessment and education complete, DC therapy    Frequency     Barriers to discharge        Co-evaluation               End of Session Equipment Utilized During Treatment: Gait belt Activity Tolerance: Patient tolerated treatment well Patient left: in chair;with call bell/phone within reach Nurse Communication: Mobility status ([place], and [place], RN's observed pt's moblity, and mobility sheet left hanging in ythe room) PT Visit Diagnosis: History of falling (Z91.81)    Time: 0912-0930 PT Time Calculation (min) (ACUTE ONLY): 18 min   Charges:   PT Evaluation $PT Eval Low Complexity: 1 Procedure     PT G Codes:   PT G-Codes **NOT FOR INPATIENT CLASS** Functional Assessment Tool Used: AM-PAC 6 Clicks Basic Mobility;Clinical judgement Functional Limitation: Mobility: Walking and moving around Mobility: Walking and Moving Around Current Status (Z3664): At least 20 percent but less than 40 percent impaired, limited or restricted Mobility:  Walking and Moving Around Goal Status [phone]): At least 1 percent but less than 20 percent impaired, limited or restricted    Beth Aengus Sauceda, PT, DPT X: [phone]

## 2017-03-18 NOTE — Progress Notes (Signed)
PROGRESS NOTE                                                                                                                                                                                                             Patient Demographics:    Carol Rosario, is a 76 y.o. female, DOB - [birth year], LPF:[id number]  Admit date - 03/16/2017   Admitting Physician Orvan Falconer, MD  Outpatient Primary MD for the patient is Glo Herring, MD  LOS - 1  Chief Complaint  Patient presents with  . Loss of Consciousness       Brief Narrative  Carol Vanloan Cannonis an 76 y.o.femalewith hx of anxiety, hypothrydoisim, s/p hip surgery, CCY, presented to the ER with syncope, CT of the brain was done and it was negative. She has an abdominal CT which showed no acute process, but there was incidentally a 2cm lesion on the right lobe of a liver, and ? Soft tissue mass in the perineum, she also had UTI and appeared dehydrated with ARF.   Subjective:    Carol Rosario today has, No headache, No chest pain, No abdominal pain - No Nausea, No new weakness tingling or numbness, No Cough - SOB.     Assessment  & Plan :     1.Syncope - Likely due to dehydration and hypotension, CT head nonacute, she has been hydrated and symptoms are largely gone, we will monitor orthostatics, advance activity, PT eval. Echo stable, stable on telemetry, May require event monitor upon discharge.  2.UTI - Rocephin, follow cultures.  3.Bifascicular Block, bradycardia upon admission, mildly elevated troponin. Troponin rise and on ACS pattern, no chest pain, Echo  unremarkable, place on aspirin, outpatient event monitor if desired by cardiology. Seen by Cards.  4. Dyslipidemia. On statin continue.  5.ARF - from dehydration, almost resolved with IVF.  6. Nonspecific noncontrast CT findings of 2 cm liver lesion and ? mass in the perineum. If renal function is stable recheck  contrast CT abdomen and pelvis either tomorrow or outpatient.   Diet : Diet Heart Room service appropriate? Yes; Fluid consistency: Thin    Family Communication  :  None  Code Status :  Full  Disposition Plan  :  TBD  Consults  :  None  Procedures  :    CT head - nona cute  TTE - Left ventricle: The cavity size was normal. There was mild focal basal hypertrophy of the septum. Systolic function was normal. The estimated ejection fraction was in the range of 60% to 65%. Wall motion was normal; there were no regional wall motion abnormalities. Left ventricular diastolic function parameters were normal. - Aorta: Root not well seen Shadowing artifact appears normal diameter. - Atrial septum: No defect or patent foramen ovale was identified. - Pulmonary arteries: PA peak pressure: 34 mm Hg (S).   CT Abd Pelvis -  IMPRESSION: 1. Negative for bowel obstruction or acute inflammatory process 2. 2.2 cm indeterminate hypodense lesion in the right hepatic lobe ; further evaluation with contrast-enhanced CT or MRI may be obtained on a nonemergent basis. 3. Small fat containing ventral hernia. 4. Rectovaginal soft tissue fullness with small amount of air in the vagina, correlate with physical exam   DVT Prophylaxis  :  Heparin   Lab Results  Component Value Date   PLT 146 (L) 03/16/2017    Inpatient Medications  Scheduled Meds: . cefTRIAXone (ROCEPHIN)  IV  1 g Intravenous Q24H  . gabapentin  200 mg Oral TID  . heparin  5,000 Units Subcutaneous Q8H  . levothyroxine  50 mcg Oral QAC breakfast  . omega-3 acid ethyl esters  2 g Oral Daily  . simvastatin  40 mg Oral QHS  . sodium chloride flush  3 mL Intravenous Q12H   Continuous Infusions: . dextrose 5 % and 0.9% NaCl     PRN Meds:.acetaminophen, ALPRAZolam, [DISCONTINUED] ondansetron **OR** ondansetron (ZOFRAN) IV  Antibiotics  :    Anti-infectives    Start     Dose/Rate Route Frequency Ordered Stop   03/18/17 0130  cefTRIAXone  (ROCEPHIN) 1 g in dextrose 5 % 50 mL IVPB     1 g 100 mL/hr over 30 Minutes Intravenous Every 24 hours 03/17/17 0415     03/17/17 0130  cefTRIAXone (ROCEPHIN) 1 g in dextrose 5 % 50 mL IVPB     1 g 100 mL/hr over 30 Minutes Intravenous  Once 03/17/17 0116 03/17/17 0222         Objective:   Vitals:   03/18/17 0900 03/18/17 0922 03/18/17 1000 03/18/17 1142  BP:  133/80    Pulse: 75 72 67 74  Resp: (!) 26  20 15   Temp:    97.9 F (36.6 C)  TempSrc:    Axillary  SpO2: 97%  96% 96%  Weight:      Height:        Wt Readings from Last 3 Encounters:  03/18/17 71 kg (156 lb 8.4 oz)  05/10/15 73.5 kg (162 lb)  05/04/14 67.6 kg (149 lb)     Intake/Output Summary (Last 24 hours) at 03/18/17 1215 Last data filed at 03/18/17 1153  Gross per 24 hour  Intake          3691.33 ml  Output             3650 ml  Net            41.33 ml     Physical Exam  Awake Alert, Oriented X 3, No new F.N deficits, Normal affect [name],[name] Supple Neck,No JVD, No cervical lymphadenopathy appriciated.  Symmetrical Chest wall movement, Good air movement bilaterally, CTAB RRR,No Gallops,Rubs or new Murmurs, No Parasternal Heave +ve B.Sounds, Abd Soft, No tenderness, No organomegaly appriciated, No rebound - guarding or rigidity. No Cyanosis, Clubbing or edema, No new Rash or bruise  Data Review:    CBC  Recent Labs Lab 03/16/17 2247  WBC 13.4*  HGB 12.0  HCT 37.0  PLT 146*  MCV 91.1  MCH 29.6  MCHC 32.4  RDW 13.2  LYMPHSABS 1.7  MONOABS 0.6  EOSABS 0.1  BASOSABS 0.0    Chemistries   Recent Labs Lab 03/16/17 2247 03/17/17 1006 03/17/17 1549 03/18/17 0450  NA 135 137 138 141  K 4.0 4.4 3.9 4.2  CL 103 107 106 109  CO2 25 24 26 27   GLUCOSE 142* 149* 146* 102*  BUN 48* 39* 33* 22*  CREATININE 1.99* 1.60* 1.52* 1.35*  CALCIUM 9.0 8.6* 8.6* 8.5*  AST 22  --   --   --   ALT 15  --   --   --   ALKPHOS 43  --   --   --   BILITOT 0.2*  --   --   --     ------------------------------------------------------------------------------------------------------------------ No results for input(s): CHOL, HDL, LDLCALC, TRIG, CHOLHDL, LDLDIRECT in the last 72 hours.  No results found for: HGBA1C ------------------------------------------------------------------------------------------------------------------  Recent Labs  03/17/17 0423  TSH 1.942   ------------------------------------------------------------------------------------------------------------------ No results for input(s): VITAMINB12, FOLATE, FERRITIN, TIBC, IRON, RETICCTPCT in the last 72 hours.  Coagulation profile No results for input(s): INR, PROTIME in the last 168 hours.  No results for input(s): DDIMER in the last 72 hours.  Cardiac Enzymes  Recent Labs Lab 03/17/17 1006 03/17/17 1549 03/18/17 0450  TROPONINI <0.03 0.08* 0.05*   ------------------------------------------------------------------------------------------------------------------ No results found for: BNP  Micro Results Recent Results (from the past 240 hour(s))  Culture, blood (routine x 2)     Status: None (Preliminary result)   Collection Time: 03/16/17 12:58 AM  Result Value Ref Range Status   Specimen Description BLOOD LEFT HAND  Final   Special Requests   Final    BOTTLES DRAWN AEROBIC ONLY Blood Culture adequate volume   Culture NO GROWTH 1 DAY  Final   Report Status PENDING  Incomplete  C difficile quick scan w PCR reflex     Status: None   Collection Time: 03/17/17 12:33 AM  Result Value Ref Range Status   C Diff antigen NEGATIVE NEGATIVE Final   C Diff toxin NEGATIVE NEGATIVE Final   C Diff interpretation No C. difficile detected.  Final  Culture, blood (routine x 2)     Status: None (Preliminary result)   Collection Time: 03/17/17 12:43 AM  Result Value Ref Range Status   Specimen Description LEFT ANTECUBITAL  Final   Special Requests   Final    BOTTLES DRAWN AEROBIC AND  ANAEROBIC Blood Culture results may not be optimal due to an inadequate volume of blood received in culture bottles   Culture NO GROWTH 1 DAY  Final   Report Status PENDING  Incomplete  MRSA PCR Screening     Status: None   Collection Time: 03/17/17  4:04 AM  Result Value Ref Range Status   MRSA by PCR NEGATIVE NEGATIVE Final    Comment:        The GeneXpert MRSA Assay (FDA approved for NASAL specimens only), is one component of a comprehensive MRSA colonization surveillance program. It is not intended to diagnose MRSA infection nor to guide or monitor treatment for MRSA infections.     Radiology Reports Ct Abdomen Pelvis Wo Contrast  Result Date: 03/17/2017 CLINICAL DATA:  Stomach cramps, nausea vomiting and diarrhea EXAM: CT ABDOMEN AND PELVIS WITHOUT CONTRAST TECHNIQUE: Multidetector CT imaging of  the abdomen and pelvis was performed following the standard protocol without IV contrast. COMPARISON:  None. FINDINGS: Lower chest: Lung bases demonstrate scarring in the right lower lobe. Irregular density in the lingula, suspect scarring. No pleural effusion. Nonenlarged heart. Hepatobiliary: 2.2 cm low-density lesion in the right hepatic lobe. No other focal hepatic abnormalities are seen. Surgical absence of the gallbladder. No biliary dilatation. Pancreas: Coarse pancreatic calcification.  No inflammation. Spleen: Normal in size without focal abnormality. Adrenals/Urinary Tract: Adrenal glands are within normal limits. No hydronephrosis. The bladder is unremarkable but obscured by artifact. Stomach/Bowel: No dilated small bowel. The stomach contains debris postsurgical changes of the sigmoid colon. No wall thickening. Appendix not well identified but no right lower quadrant inflammation Vascular/Lymphatic: Atherosclerosis. No aneurysmal dilatation. No grossly enlarged lymph nodes Reproductive: Status post hysterectomy. No adnexal masses. Rectovaginal soft tissue fullness with air in the  vagina. Other: No free air. No significant free fluid. Fat containing infraumbilical ventral hernia. Musculoskeletal: Degenerative changes. No acute or suspicious bone lesion. IMPRESSION: 1. Negative for bowel obstruction or acute inflammatory process 2. 2.2 cm indeterminate hypodense lesion in the right hepatic lobe ; further evaluation with contrast-enhanced CT or MRI may be obtained on a nonemergent basis. 3. Small fat containing ventral hernia. 4. Rectovaginal soft tissue fullness with small amount of air in the vagina, correlate with physical exam. Electronically Signed   By: Donavan Foil M.D.   On: 03/17/2017 00:50   Ct Head Wo Contrast  Result Date: 03/17/2017 CLINICAL DATA:  Sudden LOC after a fall EXAM: CT HEAD WITHOUT CONTRAST TECHNIQUE: Contiguous axial images were obtained from the base of the skull through the vertex without intravenous contrast. COMPARISON:  None. FINDINGS: Brain: No acute territorial infarction, hemorrhage, or focal mass lesion is visualized. There is moderate periventricular, subcortical and deep white matter hypodensity consistent with small vessel ischemia. Mild to moderate atrophy. The ventricles are nonenlarged. Vascular: No hyperdense vessels. There are carotid artery calcifications. Skull: No fracture. Small amount of fluid in the inferior left mastoid. No suspicious bone lesion. Sinuses/Orbits: No acute orbital abnormality. Minimal mucosal thickening in the sphenoid and ethmoid sinuses. Other: None IMPRESSION: No CT evidence for acute intracranial abnormality. Moderate white matter small vessel ischemic changes. Electronically Signed   By: Donavan Foil M.D.   On: 03/17/2017 00:40    Time Spent in minutes  30   Ladina Shutters K M.D on 03/18/2017 at 12:15 PM  Between 7am to 7pm - Pager - [phone] ( page via [facility], text pages only, please mention full 10 digit call back number).  After 7pm go to [email or web address] - password [facility]  [facility] -  Office   [phone]

## 2017-03-18 NOTE — Care Management Important Message (Signed)
Important Message  Patient Details  Name: RONELLA PLUNK MRN: [id number] Date of Birth: 02-02-1941   Medicare Important Message Given:  Yes    Phinley Schall, Chauncey [place], RN 03/18/2017, 1:43 PM

## 2017-03-18 NOTE — Progress Notes (Signed)
F/u arranged with Arnold Long, [place] in [facility] - 03/27/17 at [place] PA-C

## 2017-03-19 LAB — BASIC METABOLIC PANEL
Anion gap: 7 (ref 5–15)
BUN: 16 mg/dL (ref 6–20)
CO2: 24 mmol/L (ref 22–32)
Calcium: 8.6 mg/dL — ABNORMAL LOW (ref 8.9–10.3)
Chloride: 108 mmol/L (ref 101–111)
Creatinine, Ser: 1.09 mg/dL — ABNORMAL HIGH (ref 0.44–1.00)
GFR calc Af Amer: 56 mL/min — ABNORMAL LOW (ref >60)
GFR calc non Af Amer: 48 mL/min — ABNORMAL LOW (ref >60)
Glucose, Bld: 97 mg/dL (ref 65–99)
Potassium: 3.8 mmol/L (ref 3.5–5.1)
Sodium: 139 mmol/L (ref 135–145)

## 2017-03-19 LAB — CBC
HCT: 31.3 % — ABNORMAL LOW (ref 36.0–46.0)
Hemoglobin: 10.3 g/dL — ABNORMAL LOW (ref 12.0–15.0)
MCH: 30.1 pg (ref 26.0–34.0)
MCHC: 32.9 g/dL (ref 30.0–36.0)
MCV: 91.5 fL (ref 78.0–100.0)
Platelets: 135 10*3/uL — ABNORMAL LOW (ref 150–400)
RBC: 3.42 MIL/uL — ABNORMAL LOW (ref 3.87–5.11)
RDW: 13 % (ref 11.5–15.5)
WBC: 5.6 10*3/uL (ref 4.0–10.5)

## 2017-03-19 LAB — TSH: TSH: 2.999 u[IU]/mL (ref 0.350–4.500)

## 2017-03-19 LAB — CORTISOL: Cortisol, Plasma: 19.5 ug/dL

## 2017-03-19 MED ORDER — SODIUM CHLORIDE 0.9 % IV BOLUS (SEPSIS): 500.0000 mL | Freq: Once | INTRAVENOUS | Status: DC

## 2017-03-19 MED ORDER — LISINOPRIL-HYDROCHLOROTHIAZIDE 20-12.5 MG PO TABS: 1.0000 | ORAL_TABLET | Freq: Every day | ORAL | Status: DC

## 2017-03-19 MED ORDER — CEFPODOXIME PROXETIL 200 MG PO TABS: 200.0000 mg | ORAL_TABLET | Freq: Two times a day (BID) | ORAL | 0 refills | Status: DC

## 2017-03-19 NOTE — Progress Notes (Signed)
Pt and daughter given AVS & discussed &answer all questions. Pt meds obtained &given to Pt. Pt transported to awaiting vehicle via wheelchair

## 2017-03-19 NOTE — Discharge Instructions (Signed)
Follow with Primary MD Glo Herring, MD in 7 days   Get CBC, CMP, 2 view Chest X ray checked  by Primary MD or SNF MD in 5-7 days ( we routinely change or add medications that can affect your baseline labs and fluid status, therefore we recommend that you get the mentioned basic workup next visit with your PCP, your PCP may decide not to get them or add new tests based on their clinical decision)  Activity: As tolerated with Full fall precautions use walker/cane & assistance as needed  Disposition Hom   Diet:  Heart Healthy    For Heart failure patients - Check your Weight same time everyday, if you gain over 2 pounds, or you develop in leg swelling, experience more shortness of breath or chest pain, call your Primary MD immediately. Follow Cardiac Low Salt Diet and 1.5 lit/day fluid restriction.  On your next visit with your primary care physician please Get Medicines reviewed and adjusted.  Please request your Prim.MD to go over all Hospital Tests and Procedure/Radiological results at the follow up, please get all Hospital records sent to your Prim MD by signing hospital release before you go home.  If you experience worsening of your admission symptoms, develop shortness of breath, life threatening emergency, suicidal or homicidal thoughts you must seek medical attention immediately by calling 911 or calling your MD immediately  if symptoms less severe.  You Must read complete instructions/literature along with all the possible adverse reactions/side effects for all the Medicines you take and that have been prescribed to you. Take any new Medicines after you have completely understood and accpet all the possible adverse reactions/side effects.   Do not drive, operate heavy machinery, perform activities at heights, swimming or participation in water activities or provide baby sitting services if your were admitted for syncope or siezures until you have seen by Primary MD or a Neurologist  and advised to do so again.  Do not drive when taking Pain medications.    Do not take more than prescribed Pain, Sleep and Anxiety Medications  Special Instructions: If you have smoked or chewed Tobacco  in the last 2 yrs please stop smoking, stop any regular Alcohol  and or any Recreational drug use.  Wear Seat belts while driving.   Please note  You were cared for by a hospitalist during your hospital stay. If you have any questions about your discharge medications or the care you received while you were in the hospital after you are discharged, you can call the unit and asked to speak with the hospitalist on call if the hospitalist that took care of you is not available. Once you are discharged, your primary care physician will handle any further medical issues. Please note that NO REFILLS for any discharge medications will be authorized once you are discharged, as it is imperative that you return to your primary care physician (or establish a relationship with a primary care physician if you do not have one) for your aftercare needs so that they can reassess your need for medications and monitor your lab values.

## 2017-03-19 NOTE — Discharge Summary (Signed)
Carol Rosario OBS:[id number] DOB: [birth year] DOA: 03/16/2017  PCP: Glo Herring, MD  Admit date: 03/16/2017  Discharge date: 03/19/2017  Admitted From: Home   Disposition:  Home   Recommendations for Outpatient Follow-up:   Follow up with PCP in 1-2 weeks  PCP Please obtain BMP/CBC, 2 view CXR in 1week,  (see Discharge instructions)   PCP Please follow up on the following pending results: Needs repeat CT abd-Pelvis with Oral and IV contrast in 1 week, review present CT report.   Home Health: None   Equipment/Devices: None  Consultations: Cards Discharge Condition: Stable   CODE STATUS: Full   Diet Recommendation:  Heart Healthy    Chief Complaint  Patient presents with  . Loss of Consciousness     Brief history of present illness from the day of admission and additional interim summary    [place] an 76 y.o.femalewith hx of anxiety, hypothrydoisim, s/p hip surgery, CCY, presented to the ER with syncope, CT of the brain was done and it was negative. She has an abdominal CT which showed no acute process, but there was incidentally a 2cm lesion on the right lobe of a liver, and ? Soft tissue mass in the perineum, she also had UTI and appeared dehydrated with ARF.                                                                 Hospital Course    1.Syncope - Due to dehydration and hypotension, CT head nonacute, she has been hydrated and symptoms are largely gone, we will monitor orthostatics, advance activity, PT eval done and she did fine. Echo stable, stable on telemetry, May require event monitor upon discharge Cards will arrange if needed. Now symptom free, will DC Home.  2.UTI - responded to Rocephin, 2 more days of Vantin, cultures -ve so far.Marland Kitchen  3.Bifascicular Block, bradycardia upon  admission, mildly elevated troponin. Troponin rise and on ACS pattern, no chest pain, Echo  unremarkable, place on aspirin, outpatient event monitor if desired by cardiology. Seen by Cards outpt follow up.  4. Dyslipidemia. On statin continue.  5 . ARF - from dehydration, resolved with IVF. Resume home BP meds (HCTZ+ACE) in 2 days from now.  6. Nonspecific noncontrast CT findings of 2 cm liver lesion and ? mass in the perineum. If renal function remians stable request PCP to recheck contrast CT abdomen and pelvis in 1 week.   Discharge diagnosis     Active Problems:   Abnormal abdominal CT scan   Acute lower UTI   Dehydration   AKI (acute kidney injury) (HCC)   Bradycardia   Bifascicular block   Syncope and collapse   Hypotension    Discharge instructions    Discharge Instructions    Diet - low sodium  heart healthy    Complete by:  As directed    Discharge instructions    Complete by:  As directed    Follow with Primary MD Glo Herring, MD in 7 days   Get CBC, CMP, 2 view Chest X ray checked  by Primary MD or SNF MD in 5-7 days ( we routinely change or add medications that can affect your baseline labs and fluid status, therefore we recommend that you get the mentioned basic workup next visit with your PCP, your PCP may decide not to get them or add new tests based on their clinical decision)  Activity: As tolerated with Full fall precautions use walker/cane & assistance as needed  Disposition Hom   Diet:  Heart Healthy    For Heart failure patients - Check your Weight same time everyday, if you gain over 2 pounds, or you develop in leg swelling, experience more shortness of breath or chest pain, call your Primary MD immediately. Follow Cardiac Low Salt Diet and 1.5 lit/day fluid restriction.  On your next visit with your primary care physician please Get Medicines reviewed and adjusted.  Please request your Prim.MD to go over all Hospital Tests and  Procedure/Radiological results at the follow up, please get all Hospital records sent to your Prim MD by signing hospital release before you go home.  If you experience worsening of your admission symptoms, develop shortness of breath, life threatening emergency, suicidal or homicidal thoughts you must seek medical attention immediately by calling 911 or calling your MD immediately  if symptoms less severe.  You Must read complete instructions/literature along with all the possible adverse reactions/side effects for all the Medicines you take and that have been prescribed to you. Take any new Medicines after you have completely understood and accpet all the possible adverse reactions/side effects.   Do not drive, operate heavy machinery, perform activities at heights, swimming or participation in water activities or provide baby sitting services if your were admitted for syncope or siezures until you have seen by Primary MD or a Neurologist and advised to do so again.  Do not drive when taking Pain medications.    Do not take more than prescribed Pain, Sleep and Anxiety Medications  Special Instructions: If you have smoked or chewed Tobacco  in the last 2 yrs please stop smoking, stop any regular Alcohol  and or any Recreational drug use.  Wear Seat belts while driving.   Please note  You were cared for by a hospitalist during your hospital stay. If you have any questions about your discharge medications or the care you received while you were in the hospital after you are discharged, you can call the unit and asked to speak with the hospitalist on call if the hospitalist that took care of you is not available. Once you are discharged, your primary care physician will handle any further medical issues. Please note that NO REFILLS for any discharge medications will be authorized once you are discharged, as it is imperative that you return to your primary care physician (or establish a relationship  with a primary care physician if you do not have one) for your aftercare needs so that they can reassess your need for medications and monitor your lab values.   Increase activity slowly    Complete by:  As directed       Discharge Medications   Allergies as of 03/19/2017      Reactions   Codeine Shortness Of Breath, Itching  Medication List    STOP taking these medications   oseltamivir 75 MG capsule Commonly known as:  TAMIFLU     TAKE these medications   ALPRAZolam 0.5 MG tablet Commonly known as:  XANAX Take 0.5 mg by mouth at bedtime as needed. Sleep   aspirin 325 MG EC tablet Take 1 tablet (325 mg total) by mouth 2 (two) times daily.   cefpodoxime 200 MG tablet Commonly known as:  VANTIN Take 1 tablet (200 mg total) by mouth 2 (two) times daily.   Dextromethorphan-Guaifenesin 10-100 MG/5ML liquid Commonly known as:  ROBITUSSIN DM Take 5 mLs by mouth every 12 (twelve) hours.   fish oil-omega-3 fatty acids 1000 MG capsule Take 2 g by mouth daily.   gabapentin 100 MG capsule Commonly known as:  NEURONTIN Take 1 capsule (100 mg total) by mouth 3 (three) times daily. What changed:  how much to take   HYDROcodone-acetaminophen 7.5-325 MG tablet Commonly known as:  NORCO Take 1 tablet by mouth every 6 (six) hours as needed for moderate pain.   levothyroxine 50 MCG tablet Commonly known as:  SYNTHROID, LEVOTHROID Take 50 mcg by mouth daily.   lisinopril-hydrochlorothiazide 20-12.5 MG tablet Commonly known as:  PRINZIDE,ZESTORETIC Take 1 tablet by mouth daily. Start taking on:  03/21/2017   methocarbamol 500 MG tablet Commonly known as:  ROBAXIN Take 1 tablet (500 mg total) by mouth every 6 (six) hours as needed.   PHILLIPS COLON HEALTH Caps Take 1 capsule by mouth every other day.       Follow-up Information    Jory Sims, NP Follow up.   Specialties:  Nurse Practitioner, Radiology, Cardiology Why:  [facility] in [facility] -  03/27/17 at [place] is one of the PAs that works with Dr. Bronson Ing. Contact information: [place] [address] [phone]        Glo Herring, MD. Schedule an appointment as soon as possible for a visit in 1 week(s).   Specialty:  Internal Medicine Contact information: [address] [place] [address] [phone]           Major procedures and Radiology Reports - PLEASE review detailed and final reports thoroughly  -      CT head - nona cute  TTE - Left ventricle: The cavity size was normal. There was mild focalbasal hypertrophy of the septum. Systolic function was normal.The estimated ejection fraction was in the range of 60% to 65%.Wall motion was normal; there were no regional wall motionabnormalities. Left ventricular diastolic function parameterswere normal. - Aorta: Root not well seen Shadowing artifact appears normaldiameter. - Atrial septum: No defect or patent foramen ovale was identified. - Pulmonary arteries: PA peak pressure: 34 mm Hg (S).   CT Abd Pelvis -  IMPRESSION: 1. Negative for bowel obstruction or acute inflammatory process 2. 2.2 cm indeterminate hypodense lesion in the right hepatic lobe ; further evaluation with contrast-enhanced CT or MRI may be obtained on a nonemergent basis. 3. Small fat containing ventral hernia. 4. Rectovaginal soft tissue fullness with small amount of air in the vagina, correlate with physical exam    Ct Abdomen Pelvis Wo Contrast  Result Date: 03/17/2017 CLINICAL DATA:  Stomach cramps, nausea vomiting and diarrhea EXAM: CT ABDOMEN AND PELVIS WITHOUT CONTRAST TECHNIQUE: Multidetector CT imaging of the abdomen and pelvis was performed following the standard protocol without IV contrast. COMPARISON:  None. FINDINGS: Lower chest: Lung bases demonstrate scarring in the right lower lobe. Irregular density  in the lingula, suspect scarring. No pleural effusion. Nonenlarged heart. Hepatobiliary:  2.2 cm low-density lesion in the right hepatic lobe. No other focal hepatic abnormalities are seen. Surgical absence of the gallbladder. No biliary dilatation. Pancreas: Coarse pancreatic calcification.  No inflammation. Spleen: Normal in size without focal abnormality. Adrenals/Urinary Tract: Adrenal glands are within normal limits. No hydronephrosis. The bladder is unremarkable but obscured by artifact. Stomach/Bowel: No dilated small bowel. The stomach contains debris postsurgical changes of the sigmoid colon. No wall thickening. Appendix not well identified but no right lower quadrant inflammation Vascular/Lymphatic: Atherosclerosis. No aneurysmal dilatation. No grossly enlarged lymph nodes Reproductive: Status post hysterectomy. No adnexal masses. Rectovaginal soft tissue fullness with air in the vagina. Other: No free air. No significant free fluid. Fat containing infraumbilical ventral hernia. Musculoskeletal: Degenerative changes. No acute or suspicious bone lesion. IMPRESSION: 1. Negative for bowel obstruction or acute inflammatory process 2. 2.2 cm indeterminate hypodense lesion in the right hepatic lobe ; further evaluation with contrast-enhanced CT or MRI may be obtained on a nonemergent basis. 3. Small fat containing ventral hernia. 4. Rectovaginal soft tissue fullness with small amount of air in the vagina, correlate with physical exam. Electronically Signed   By: Donavan Foil M.D.   On: 03/17/2017 00:50   Ct Head Wo Contrast  Result Date: 03/17/2017 CLINICAL DATA:  Sudden LOC after a fall EXAM: CT HEAD WITHOUT CONTRAST TECHNIQUE: Contiguous axial images were obtained from the base of the skull through the vertex without intravenous contrast. COMPARISON:  None. FINDINGS: Brain: No acute territorial infarction, hemorrhage, or focal mass lesion is visualized. There is moderate periventricular, subcortical and deep white matter hypodensity consistent with small vessel ischemia. Mild to moderate  atrophy. The ventricles are nonenlarged. Vascular: No hyperdense vessels. There are carotid artery calcifications. Skull: No fracture. Small amount of fluid in the inferior left mastoid. No suspicious bone lesion. Sinuses/Orbits: No acute orbital abnormality. Minimal mucosal thickening in the sphenoid and ethmoid sinuses. Other: None IMPRESSION: No CT evidence for acute intracranial abnormality. Moderate white matter small vessel ischemic changes. Electronically Signed   By: Donavan Foil M.D.   On: 03/17/2017 00:40    Micro Results    Recent Results (from the past 240 hour(s))  Culture, blood (routine x 2)     Status: None (Preliminary result)   Collection Time: 03/16/17 12:58 AM  Result Value Ref Range Status   Specimen Description BLOOD LEFT HAND  Final   Special Requests   Final    BOTTLES DRAWN AEROBIC ONLY Blood Culture adequate volume   Culture NO GROWTH 1 DAY  Final   Report Status PENDING  Incomplete  C difficile quick scan w PCR reflex     Status: None   Collection Time: 03/17/17 12:33 AM  Result Value Ref Range Status   C Diff antigen NEGATIVE NEGATIVE Final   C Diff toxin NEGATIVE NEGATIVE Final   C Diff interpretation No C. difficile detected.  Final  Gastrointestinal Panel by PCR , Stool     Status: None   Collection Time: 03/17/17 12:33 AM  Result Value Ref Range Status   Campylobacter species NOT DETECTED NOT DETECTED Final   Plesimonas shigelloides NOT DETECTED NOT DETECTED Final   Salmonella species NOT DETECTED NOT DETECTED Final   Yersinia enterocolitica NOT DETECTED NOT DETECTED Final   Vibrio species NOT DETECTED NOT DETECTED Final   Vibrio cholerae NOT DETECTED NOT DETECTED Final   Enteroaggregative E coli (EAEC) NOT DETECTED NOT DETECTED Final  Enteropathogenic E coli (EPEC) NOT DETECTED NOT DETECTED Final   Enterotoxigenic E coli (ETEC) NOT DETECTED NOT DETECTED Final   Shiga like toxin producing E coli (STEC) NOT DETECTED NOT DETECTED Final    Shigella/Enteroinvasive E coli (EIEC) NOT DETECTED NOT DETECTED Final   Cryptosporidium NOT DETECTED NOT DETECTED Final   Cyclospora cayetanensis NOT DETECTED NOT DETECTED Final   Entamoeba histolytica NOT DETECTED NOT DETECTED Final   Giardia lamblia NOT DETECTED NOT DETECTED Final   Adenovirus F40/41 NOT DETECTED NOT DETECTED Final   Astrovirus NOT DETECTED NOT DETECTED Final   Norovirus GI/GII NOT DETECTED NOT DETECTED Final   Rotavirus A NOT DETECTED NOT DETECTED Final   Sapovirus (I, II, IV, and V) NOT DETECTED NOT DETECTED Final  Culture, blood (routine x 2)     Status: None (Preliminary result)   Collection Time: 03/17/17 12:43 AM  Result Value Ref Range Status   Specimen Description LEFT ANTECUBITAL  Final   Special Requests   Final    BOTTLES DRAWN AEROBIC AND ANAEROBIC Blood Culture results may not be optimal due to an inadequate volume of blood received in culture bottles   Culture NO GROWTH 1 DAY  Final   Report Status PENDING  Incomplete  MRSA PCR Screening     Status: None   Collection Time: 03/17/17  4:04 AM  Result Value Ref Range Status   MRSA by PCR NEGATIVE NEGATIVE Final    Comment:        The GeneXpert MRSA Assay (FDA approved for NASAL specimens only), is one component of a comprehensive MRSA colonization surveillance program. It is not intended to diagnose MRSA infection nor to guide or monitor treatment for MRSA infections.     Today   Subjective    Armonee Bojanowski today has no headache,no chest abdominal pain,no new weakness tingling or numbness, feels much better wants to go home today.    Objective   Blood pressure 135/67, pulse (!) 49, temperature 98.2 F (36.8 C), temperature source Oral, resp. rate 16, height 5\' 4"  (1.626 m), weight 71.6 kg (157 lb 13.6 oz), SpO2 92 %.   Intake/Output Summary (Last 24 hours) at 03/19/17 0748 Last data filed at 03/19/17 0500  Gross per 24 hour  Intake          2885.83 ml  Output             5300 ml  Net          -2414.17 ml    Exam Awake Alert, Oriented x 3, No new F.N deficits, Normal affect [place].AT,[name] Supple Neck,No JVD, No cervical lymphadenopathy appriciated.  Symmetrical Chest wall movement, Good air movement bilaterally, CTAB RRR,No Gallops,Rubs or new Murmurs, No Parasternal Heave +ve B.Sounds, Abd Soft, Non tender, No organomegaly appriciated, No rebound -guarding or rigidity. No Cyanosis, Clubbing or edema, No new Rash or bruise   Data Review   CBC w Diff: Lab Results  Component Value Date   WBC 5.6 03/19/2017   HGB 10.3 (L) 03/19/2017   HCT 31.3 (L) 03/19/2017   PLT 135 (L) 03/19/2017   LYMPHOPCT 13 03/16/2017   MONOPCT 5 03/16/2017   EOSPCT 1 03/16/2017   BASOPCT 0 03/16/2017    CMP: Lab Results  Component Value Date   NA 139 03/19/2017   K 3.8 03/19/2017   CL 108 03/19/2017   CO2 24 03/19/2017   BUN 16 03/19/2017   CREATININE 1.09 (H) 03/19/2017   CREATININE 1.35 (H) 05/24/2012   PROT  6.4 (L) 03/16/2017   ALBUMIN 3.8 03/16/2017   BILITOT 0.2 (L) 03/16/2017   ALKPHOS 43 03/16/2017   AST 22 03/16/2017   ALT 15 03/16/2017  . Lab Results  Component Value Date   TSH 1.942 03/17/2017     Total Time in preparing paper work, data evaluation and todays exam - 35 minutes  Thurnell Lose M.D on 03/19/2017 at 7:48 AM  [facility]   Office  [phone]

## 2017-03-22 LAB — CULTURE, BLOOD (ROUTINE X 2)
Culture: NO GROWTH
Culture: NO GROWTH
Special Requests: ADEQUATE

## 2017-03-26 NOTE — Progress Notes (Signed)
Cardiology Office Note   Date:  03/27/2017   ID:  Carol Rosario, DOB 1941-01-04, MRN [id number]  PCP:  Glo Herring, MD  Cardiologist:  Woodroe Chen, NP   Chief Complaint  Patient presents with  . Chest Pain  . Bradycardia      History of Present Illness: Carol Rosario is a 76 y.o. female who presents for posthospitalization follow-up, after admission for hypotension, with associated nausea and vomiting. The patient had bradycardia, palpitations, and mild dizziness. She has a history of anxiety, colon cancer, hypothyroidism, hypertension, hyperlipidemia, bifascicular block, and remote history of syncope. EKG changes with right bundle branch block, left anterior fascicular block were old. She was found to have mild elevation in troponin related to demand ischemia. She was also treated for UTI and mild dehydration.  She comes today feeling better with some weakness,. No dizziness, near syncope. Some unsteadiness with walking. She is accompanied by her sister. She has had not significant chest pain, but some uncomfortable feelings. Energy level is low.    Past Medical History:  Diagnosis Date  . Anxiety   . Bifascicular block   . Cancer ([place]) 08/2012   colon cancer  . Degeneration of lumbar or lumbosacral intervertebral disc   . DJD (degenerative joint disease)   . Family history of tobacco abuse and dependence   . History of recurrent UTIs   . Hyperlipidemia   . Hypertension   . Hypothyroidism   . Obesity   . Palpitations   . PONV (postoperative nausea and vomiting)   . Sciatica   . Syncope 2008    Past Surgical History:  Procedure Laterality Date  . bilateral tubal ligation    . bladder resuspension    . CATARACT EXTRACTION, BILATERAL    . CHOLECYSTECTOMY    . FLEXIBLE SIGMOIDOSCOPY  08/26/2012   Procedure: FLEXIBLE SIGMOIDOSCOPY;  Surgeon: Daneil Dolin, MD;  Location: [facility];  Service: Endoscopy;  Laterality: N/A;  . HIP SURGERY  2011   left, [facility]  . KNEE SURGERY     left arthroscopy  . PARTIAL COLECTOMY  09/13/2012   Procedure: PARTIAL COLECTOMY;  Surgeon: Jamesetta So, MD;  Location: [facility] ORS;  Service: General;  Laterality: N/A;  . TOTAL HIP ARTHROPLASTY Right 05/02/2013   Procedure: TOTAL HIP ARTHROPLASTY;  Surgeon: Carole Civil, MD;  Location: [facility] ORS;  Service: Orthopedics;  Laterality: Right;  Right Total Hip Arthroplasty  . TUBAL LIGATION    . VESICOVAGINAL FISTULA CLOSURE W/ TAH       Current Outpatient Prescriptions  Medication Sig Dispense Refill  . ALPRAZolam (XANAX) 0.5 MG tablet Take 0.5 mg by mouth at bedtime as needed. Sleep    . aspirin EC 325 MG EC tablet Take 1 tablet (325 mg total) by mouth 2 (two) times daily. 30 tablet 0  . cefpodoxime (VANTIN) 200 MG tablet Take 1 tablet (200 mg total) by mouth 2 (two) times daily. 4 tablet 0  . Dextromethorphan-Guaifenesin (ROBITUSSIN DM) 10-100 MG/5ML liquid Take 5 mLs by mouth every 12 (twelve) hours. 120 mL 0  . fish oil-omega-3 fatty acids 1000 MG capsule Take 2 g by mouth daily.    Marland Kitchen gabapentin (NEURONTIN) 100 MG capsule Take 1 capsule (100 mg total) by mouth 3 (three) times daily. (Patient taking differently: Take 200 mg by mouth 3 (three) times daily. ) 90 capsule 2  . HYDROcodone-acetaminophen (NORCO) 7.5-325 MG per tablet Take 1 tablet by mouth every 6 (six) hours  as needed for moderate pain.    Marland Kitchen levothyroxine (SYNTHROID, LEVOTHROID) 50 MCG tablet Take 50 mcg by mouth daily.    Marland Kitchen lisinopril-hydrochlorothiazide (PRINZIDE,ZESTORETIC) 20-12.5 MG tablet Take 1 tablet by mouth daily.    . methocarbamol (ROBAXIN) 500 MG tablet Take 1 tablet (500 mg total) by mouth every 6 (six) hours as needed. 60 tablet 1  . Probiotic Product ([place]) CAPS Take 1 capsule by mouth every other day.     No current facility-administered medications for this visit.     Allergies:   Codeine    Social History:  The patient  reports that she quit smoking about 6  years ago. Her smoking use included Cigarettes. She has never used smokeless tobacco. She reports that she does not drink alcohol or use drugs.   Family History:  The patient's family history is not on file.    ROS: All other systems are reviewed and negative. Unless otherwise mentioned in H&P    PHYSICAL EXAM: VS:  BP 112/64   Pulse 73   Ht 5\' 4"  (1.626 m)   Wt 153 lb (69.4 kg)   SpO2 93%   BMI 26.26 kg/m  , BMI Body mass index is 26.26 kg/m. GEN: Well nourished, well developed, in no acute distress  HEENT: normal  Neck: no JVD, soft right carotid bruits, or masses Cardiac: RRR; distant heart sounds,  no murmurs, rubs, or gallops,no edema DP 1+  Respiratory:  clear to auscultation bilaterally, normal work of breathing GI: soft, nontender, nondistended, + BS MS: no deformity or atrophy  Skin: warm and dry, no rash Neuro:  Strength and sensation are intact Psych: euthymic mood, full affect   Recent Labs: 03/16/2017: ALT 15 03/19/2017: BUN 16; Creatinine, Ser 1.09; Hemoglobin 10.3; Platelets 135; Potassium 3.8; Sodium 139; TSH 2.999    Lipid Panel    Component Value Date/Time   CHOL 243 (H) 05/24/2012 0951   TRIG 288 (H) 05/24/2012 0951   HDL 39 (L) 05/24/2012 0951   CHOLHDL 6.2 05/24/2012 0951   VLDL 58 (H) 05/24/2012 0951   LDLCALC 146 (H) 05/24/2012 0951      Wt Readings from Last 3 Encounters:  03/27/17 153 lb (69.4 kg)  03/19/17 157 lb 13.6 oz (71.6 kg)  05/10/15 162 lb (73.5 kg)      Other studies Reviewed:  Echocardiogram 03/17/2017 Left ventricle: The cavity size was normal. There was mild focal   basal hypertrophy of the septum. Systolic function was normal.   The estimated ejection fraction was in the range of 60% to 65%.   Wall motion was normal; there were no regional wall motion   abnormalities. Left ventricular diastolic function parameters   were normal. - Aorta: Root not well seen Shadowing artifact appears normal   diameter. - Atrial septum:  No defect or patent foramen ovale was identified. - Pulmonary arteries: PA peak pressure: 34 mm Hg (S).  ASSESSMENT AND PLAN:  1.  Hypertension: BP well controlled without further episodes of hypotension. She is back on previous medical regimen. She was treated for UTI and hydrated, with resolution of symptoms.   2. Chest discomfort: She has multiple CVRF for CAD. Hypertension, FH, hypercholesterolemia. Will symptoms of weakness and mild pressure in her chest will plan a Lexiscan stress test for diagnostic prognostic purposes.   3. Carotid Bruit: Right carotid bruit noted on exam. Will have doppler studies completed.    Current medicines are reviewed at length with the patient today.  Labs/ tests ordered today include:   Orders Placed This Encounter  Procedures  . US Carotid Duplex Bilateral  . NM Myocar Multi W/Spect W/Wall Motion / EF     Disposition:   FU with cardiology to discuss test results   Signed, Jory Sims, NP  03/27/2017 10:26 AM    [facility]. [address] [place], [place] [address] Phone: [phone]; Fax: [phone]

## 2017-03-27 ENCOUNTER — Ambulatory Visit (INDEPENDENT_AMBULATORY_CARE_PROVIDER_SITE_OTHER): Payer: Medicare Other | Admitting: Adult Health

## 2017-03-27 ENCOUNTER — Encounter: Payer: Self-pay | Admitting: Adult Health

## 2017-03-27 ENCOUNTER — Encounter: Payer: Self-pay | Admitting: *Deleted

## 2017-03-27 VITALS — BP 112/64 | HR 73 | Ht 64.0 in | Wt 153.0 lb

## 2017-03-27 DIAGNOSIS — I10 Essential (primary) hypertension: Secondary | ICD-10-CM

## 2017-03-27 DIAGNOSIS — I208 Other forms of angina pectoris: Secondary | ICD-10-CM

## 2017-03-27 DIAGNOSIS — R0989 Other specified symptoms and signs involving the circulatory and respiratory systems: Secondary | ICD-10-CM

## 2017-03-27 NOTE — Patient Instructions (Signed)
Your physician recommends that you schedule a follow-up appointment after test.  Your physician recommends that you continue on your current medications as directed. Please refer to the Current Medication list given to you today.  Your physician has requested that you have a carotid duplex. This test is an ultrasound of the carotid arteries in your neck. It looks at blood flow through these arteries that supply the brain with blood. Allow one hour for this exam. There are no restrictions or special instructions.  Your physician has requested that you have a lexiscan myoview. For further information please visit [email or web address]. Please follow instruction sheet, as given.  If you need a refill on your cardiac medications before your next appointment, please call your pharmacy.  Thank you for choosing [facility]!

## 2017-03-27 NOTE — Progress Notes (Signed)
Name: Carol Rosario    DOB: [birth year]  Age: 76 y.o.  MR#: [id number]       PCP:  Glo Herring, MD      Insurance: Payor: Onnie Boer MEDICARE / Plan: [facility] MEDICARE / Product Type: *No Product type* /   CC:   No chief complaint on file.   VS Vitals:   03/27/17 0955  BP: 112/64  Pulse: 73  SpO2: 93%  Weight: 153 lb (69.4 kg)  Height: 5\' 4"  (1.626 m)    Weights Current Weight  03/27/17 153 lb (69.4 kg)  03/19/17 157 lb 13.6 oz (71.6 kg)  05/10/15 162 lb (73.5 kg)    Blood Pressure  BP Readings from Last 3 Encounters:  03/27/17 112/64  03/19/17 (!) 126/107  05/10/15 119/61     Admit date:  (Not on file) Last encounter with RMR:  Visit date not found   Allergy Codeine  Current Outpatient Prescriptions  Medication Sig Dispense Refill  . ALPRAZolam (XANAX) 0.5 MG tablet Take 0.5 mg by mouth at bedtime as needed. Sleep    . aspirin EC 325 MG EC tablet Take 1 tablet (325 mg total) by mouth 2 (two) times daily. 30 tablet 0  . cefpodoxime (VANTIN) 200 MG tablet Take 1 tablet (200 mg total) by mouth 2 (two) times daily. 4 tablet 0  . Dextromethorphan-Guaifenesin (ROBITUSSIN DM) 10-100 MG/5ML liquid Take 5 mLs by mouth every 12 (twelve) hours. 120 mL 0  . fish oil-omega-3 fatty acids 1000 MG capsule Take 2 g by mouth daily.    Marland Kitchen gabapentin (NEURONTIN) 100 MG capsule Take 1 capsule (100 mg total) by mouth 3 (three) times daily. (Patient taking differently: Take 200 mg by mouth 3 (three) times daily. ) 90 capsule 2  . HYDROcodone-acetaminophen (NORCO) 7.5-325 MG per tablet Take 1 tablet by mouth every 6 (six) hours as needed for moderate pain.    Marland Kitchen levothyroxine (SYNTHROID, LEVOTHROID) 50 MCG tablet Take 50 mcg by mouth daily.    Marland Kitchen lisinopril-hydrochlorothiazide (PRINZIDE,ZESTORETIC) 20-12.5 MG tablet Take 1 tablet by mouth daily.    . methocarbamol (ROBAXIN) 500 MG tablet Take 1 tablet (500 mg total) by mouth every 6 (six) hours as needed. 60 tablet 1  . Probiotic Product  ([place]) CAPS Take 1 capsule by mouth every other day.     No current facility-administered medications for this visit.     Discontinued Meds:   There are no discontinued medications.  Patient Active Problem List   Diagnosis Date Noted  . Hypotension 03/18/2017  . Abnormal abdominal CT scan 03/17/2017  . Acute lower UTI 03/17/2017  . Dehydration 03/17/2017  . AKI (acute kidney injury) ([place]) 03/17/2017  . Bradycardia 03/17/2017  . Bifascicular block 03/17/2017  . Syncope and collapse 03/17/2017  . Influenza 12/23/2013  . S/P total hip arthroplasty 05/16/2013  . Degenerative arthritis of hip 05/02/2013  . Radicular leg pain 03/15/2013  . Back pain 03/15/2013  . Arthritis pain of hip 03/15/2013  . Anemia 08/09/2012  . Heme positive stool 08/09/2012  . HTN (hypertension) 05/18/2012  . Osteoarthritis of hip 10/23/2011  . Hip pain 10/23/2011  . Arthritis of knee, degenerative 09/09/2011  . TOTAL HIP FOLLOW-UP 08/27/2010  . HIP, ARTHRITIS, DEGEN./OSTEO 07/29/2010  . OBESITY 01/16/2010  . DENTAL CARIES 01/16/2010  . SYNCOPE, HX OF 01/16/2010  . 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 SCIATICA 04/17/2008    LABS    Component Value Date/Time   NA 139  03/19/2017 0424   NA 141 03/18/2017 0450   NA 138 03/17/2017 1549   K 3.8 03/19/2017 0424   K 4.2 03/18/2017 0450   K 3.9 03/17/2017 1549   CL 108 03/19/2017 0424   CL 109 03/18/2017 0450   CL 106 03/17/2017 1549   CO2 24 03/19/2017 0424   CO2 27 03/18/2017 0450   CO2 26 03/17/2017 1549   GLUCOSE 97 03/19/2017 0424   GLUCOSE 102 (H) 03/18/2017 0450   GLUCOSE 146 (H) 03/17/2017 1549   BUN 16 03/19/2017 0424   BUN 22 (H) 03/18/2017 0450   BUN 33 (H) 03/17/2017 1549   CREATININE 1.09 (H) 03/19/2017 0424   CREATININE 1.35 (H) 03/18/2017 0450   CREATININE 1.52 (H) 03/17/2017 1549   CREATININE 1.35 (H) 05/24/2012 0951   CALCIUM 8.6 (L) 03/19/2017 0424   CALCIUM 8.5 (L) 03/18/2017 0450    CALCIUM 8.6 (L) 03/17/2017 1549   GFRNONAA 48 (L) 03/19/2017 0424   GFRNONAA 37 (L) 03/18/2017 0450   GFRNONAA 32 (L) 03/17/2017 1549   GFRAA 56 (L) 03/19/2017 0424   GFRAA 43 (L) 03/18/2017 0450   GFRAA 38 (L) 03/17/2017 1549   CMP     Component Value Date/Time   NA 139 03/19/2017 0424   K 3.8 03/19/2017 0424   CL 108 03/19/2017 0424   CO2 24 03/19/2017 0424   GLUCOSE 97 03/19/2017 0424   BUN 16 03/19/2017 0424   CREATININE 1.09 (H) 03/19/2017 0424   CREATININE 1.35 (H) 05/24/2012 0951   CALCIUM 8.6 (L) 03/19/2017 0424   PROT 6.4 (L) 03/16/2017 2247   ALBUMIN 3.8 03/16/2017 2247   AST 22 03/16/2017 2247   ALT 15 03/16/2017 2247   ALKPHOS 43 03/16/2017 2247   BILITOT 0.2 (L) 03/16/2017 2247   GFRNONAA 48 (L) 03/19/2017 0424   GFRAA 56 (L) 03/19/2017 0424       Component Value Date/Time   WBC 5.6 03/19/2017 0424   WBC 13.4 (H) 03/16/2017 2247   WBC 7.9 05/05/2013 0608   HGB 10.3 (L) 03/19/2017 0424   HGB 12.0 03/16/2017 2247   HGB 10.1 (L) 05/05/2013 0608   HCT 31.3 (L) 03/19/2017 0424   HCT 37.0 03/16/2017 2247   HCT 30.8 (L) 05/05/2013 0608   MCV 91.5 03/19/2017 0424   MCV 91.1 03/16/2017 2247   MCV 91.9 05/05/2013 0608    Lipid Panel     Component Value Date/Time   CHOL 243 (H) 05/24/2012 0951   TRIG 288 (H) 05/24/2012 0951   HDL 39 (L) 05/24/2012 0951   CHOLHDL 6.2 05/24/2012 0951   VLDL 58 (H) 05/24/2012 0951   LDLCALC 146 (H) 05/24/2012 0951    ABG No results found for: PHART, PCO2ART, PO2ART, HCO3, TCO2, ACIDBASEDEF, O2SAT   Lab Results  Component Value Date   TSH 2.999 03/19/2017   BNP (last 3 results) No results for input(s): BNP in the last 8760 hours.  ProBNP (last 3 results) No results for input(s): PROBNP in the last 8760 hours.  Cardiac Panel (last 3 results) No results for input(s): CKTOTAL, CKMB, TROPONINI, RELINDX in the last 72 hours.  Iron/TIBC/Ferritin/ %Sat    Component Value Date/Time   IRON 30 (L) 07/30/2012 0815    FERRITIN 4 (L) 07/30/2012 0815     EKG Orders placed or performed during the hospital encounter of 03/16/17  . EKG 12-Lead  . EKG 12-Lead  . ED EKG  . ED EKG  . EKG     Prior Assessment  and Plan Problem List as of 03/27/2017 Reviewed: 03/17/2017  2:32 AM by Orvan Falconer, MD     Cardiovascular and Mediastinum   HTN (hypertension)   Last Assessment & Plan 05/18/2012 Office Visit Written 05/18/2012  1:44 PM by Lendon Colonel, NP    Excellent control of BP, actually on low side. With complaints of leg cramps, I will d/c HCTZ portion of zestoretic and continue just zestoril 20 mg only. I will have CMET completed along with CBC. She will also have lipids completed as they have not been checked this year. Hopefully she will have less complaint of leg cramping without use of diuretic. I will consider adding magnesium to her regimen if labs are ok.       Bifascicular block   Syncope and collapse   Hypotension     Respiratory   Influenza   Last Assessment & Plan 12/23/2013 Office Visit Written 12/23/2013  2:31 PM by Lendon Colonel, NP    Temp of 102.2  She appears to have symptoms of the flu. I will start her on Tamiflu and Robitussion She will see PCP for further assessment. I will have her rescheduled.        Digestive   DENTAL CARIES     Nervous and Auditory   SYNCOPE, HX OF   SCIATICA     Musculoskeletal and Integument   HIP, ARTHRITIS, DEGEN./OSTEO   DEGENERATIVE DISC DISEASE, LUMBAR SPINE   Arthritis of knee, degenerative   Osteoarthritis of hip   Degenerative arthritis of hip     Genitourinary   Acute lower UTI   AKI (acute kidney injury) ([place])     Other   OBESITY   TOTAL HIP FOLLOW-UP   Hip pain   Anemia   Last Assessment & Plan 07/29/2012 Office Visit Written 08/09/2012  4:38 PM by Orvil Feil, NP    CBC, iron, ferritin. TCS and possible EGD as planned. If continued anemia, evidence of iron deficiency and no source from lower or upper evaluation, consider capsule study.        Heme positive stool   Last Assessment & Plan 07/29/2012 Office Visit Written 08/09/2012  4:37 PM by Orvil Feil, NP    76 year old female with heme + stool through PCP's office. No upper or lower GI symptoms currently. Notes approximately 30 lbs wt loss of past several years, unintentional. Mild normocytic anemia noted on recent labs as above. Will update CBC, iron, ferritin. No prior colonoscopy. Proceed with TCS+/- EGD in near future.   Proceed with colonoscopy, possible upper endoscopy with Dr. Oneida Alar in the near future. The risks, benefits, and alternatives have been discussed in detail with the patient. They state understanding and desire to proceed.        Radicular leg pain   Back pain   Arthritis pain of hip   S/P total hip arthroplasty   Abnormal abdominal CT scan   Dehydration   Bradycardia       Imaging: Ct Abdomen Pelvis Wo Contrast  Result Date: 03/17/2017 CLINICAL DATA:  Stomach cramps, nausea vomiting and diarrhea EXAM: CT ABDOMEN AND PELVIS WITHOUT CONTRAST TECHNIQUE: Multidetector CT imaging of the abdomen and pelvis was performed following the standard protocol without IV contrast. COMPARISON:  None. FINDINGS: Lower chest: Lung bases demonstrate scarring in the right lower lobe. Irregular density in the lingula, suspect scarring. No pleural effusion. Nonenlarged heart. Hepatobiliary: 2.2 cm low-density lesion in the right hepatic lobe. No other focal hepatic abnormalities are seen. Surgical  absence of the gallbladder. No biliary dilatation. Pancreas: Coarse pancreatic calcification.  No inflammation. Spleen: Normal in size without focal abnormality. Adrenals/Urinary Tract: Adrenal glands are within normal limits. No hydronephrosis. The bladder is unremarkable but obscured by artifact. Stomach/Bowel: No dilated small bowel. The stomach contains debris postsurgical changes of the sigmoid colon. No wall thickening. Appendix not well identified but no right lower quadrant  inflammation Vascular/Lymphatic: Atherosclerosis. No aneurysmal dilatation. No grossly enlarged lymph nodes Reproductive: Status post hysterectomy. No adnexal masses. Rectovaginal soft tissue fullness with air in the vagina. Other: No free air. No significant free fluid. Fat containing infraumbilical ventral hernia. Musculoskeletal: Degenerative changes. No acute or suspicious bone lesion. IMPRESSION: 1. Negative for bowel obstruction or acute inflammatory process 2. 2.2 cm indeterminate hypodense lesion in the right hepatic lobe ; further evaluation with contrast-enhanced CT or MRI may be obtained on a nonemergent basis. 3. Small fat containing ventral hernia. 4. Rectovaginal soft tissue fullness with small amount of air in the vagina, correlate with physical exam. Electronically Signed   By: Donavan Foil M.D.   On: 03/17/2017 00:50   Ct Head Wo Contrast  Result Date: 03/17/2017 CLINICAL DATA:  Sudden LOC after a fall EXAM: CT HEAD WITHOUT CONTRAST TECHNIQUE: Contiguous axial images were obtained from the base of the skull through the vertex without intravenous contrast. COMPARISON:  None. FINDINGS: Brain: No acute territorial infarction, hemorrhage, or focal mass lesion is visualized. There is moderate periventricular, subcortical and deep white matter hypodensity consistent with small vessel ischemia. Mild to moderate atrophy. The ventricles are nonenlarged. Vascular: No hyperdense vessels. There are carotid artery calcifications. Skull: No fracture. Small amount of fluid in the inferior left mastoid. No suspicious bone lesion. Sinuses/Orbits: No acute orbital abnormality. Minimal mucosal thickening in the sphenoid and ethmoid sinuses. Other: None IMPRESSION: No CT evidence for acute intracranial abnormality. Moderate white matter small vessel ischemic changes. Electronically Signed   By: Donavan Foil M.D.   On: 03/17/2017 00:40

## 2017-03-31 DIAGNOSIS — D638 Anemia in other chronic diseases classified elsewhere: Secondary | ICD-10-CM | POA: Diagnosis not present

## 2017-03-31 DIAGNOSIS — N289 Disorder of kidney and ureter, unspecified: Secondary | ICD-10-CM | POA: Diagnosis not present

## 2017-03-31 DIAGNOSIS — E860 Dehydration: Secondary | ICD-10-CM | POA: Diagnosis not present

## 2017-03-31 DIAGNOSIS — N390 Urinary tract infection, site not specified: Secondary | ICD-10-CM | POA: Diagnosis not present

## 2017-04-07 ENCOUNTER — Encounter (HOSPITAL_BASED_OUTPATIENT_CLINIC_OR_DEPARTMENT_OTHER)
Admission: RE | Admit: 2017-04-07 | Discharge: 2017-04-07 | Disposition: A | Discharge status: Home / Self Care | Payer: Medicare Other | Source: Ambulatory Visit | Attending: Adult Health | Admitting: Adult Health

## 2017-04-07 ENCOUNTER — Encounter (HOSPITAL_COMMUNITY): Payer: Self-pay

## 2017-04-07 ENCOUNTER — Ambulatory Visit (HOSPITAL_COMMUNITY)
Admission: RE | Admit: 2017-04-07 | Discharge: 2017-04-07 | Disposition: A | Discharge status: Home / Self Care | Payer: Medicare Other | Source: Ambulatory Visit | Attending: Adult Health | Admitting: Adult Health

## 2017-04-07 DIAGNOSIS — I208 Other forms of angina pectoris: Secondary | ICD-10-CM | POA: Diagnosis not present

## 2017-04-07 DIAGNOSIS — I6523 Occlusion and stenosis of bilateral carotid arteries: Secondary | ICD-10-CM | POA: Diagnosis not present

## 2017-04-07 DIAGNOSIS — R0989 Other specified symptoms and signs involving the circulatory and respiratory systems: Secondary | ICD-10-CM | POA: Diagnosis present

## 2017-04-07 LAB — NM MYOCAR MULTI W/SPECT W/WALL MOTION / EF
LV dias vol: 49 mL (ref 46–106)
LV sys vol: 13 mL
Peak HR: 105 {beats}/min
RATE: 0.22
Rest HR: 61 {beats}/min
SDS: 0
SRS: 14
SSS: 14
TID: 0.84

## 2017-04-07 MED ORDER — TECHNETIUM TC 99M TETROFOSMIN IV KIT
10.0000 | PACK | Freq: Once | INTRAVENOUS | Status: AC | PRN
  Administered 2017-04-07: 11 via INTRAVENOUS

## 2017-04-07 MED ORDER — SODIUM CHLORIDE 0.9% FLUSH
INTRAVENOUS | Status: AC
  Administered 2017-04-07: 10 mL via INTRAVENOUS
  Filled 2017-04-07: qty 10

## 2017-04-07 MED ORDER — TECHNETIUM TC 99M TETROFOSMIN IV KIT
30.0000 | PACK | Freq: Once | INTRAVENOUS | Status: AC | PRN
  Administered 2017-04-07: 32 via INTRAVENOUS

## 2017-04-07 MED ORDER — REGADENOSON 0.4 MG/5ML IV SOLN
INTRAVENOUS | Status: AC
  Administered 2017-04-07: 0.4 mg via INTRAVENOUS
  Filled 2017-04-07: qty 5

## 2017-04-09 ENCOUNTER — Telehealth: Payer: Self-pay | Admitting: *Deleted

## 2017-04-09 NOTE — Telephone Encounter (Signed)
-----   Message from Lendon Colonel, NP sent at 04/07/2017  3:27 PM EDT ----- Stress test is without evidence of low blood flow to the heart causing chest pain. Low risk

## 2017-04-09 NOTE — Telephone Encounter (Signed)
Called patient with test results. No answer. Unable to leave message. Called and left message on son's voicemail to call office with correct number for mother.

## 2017-04-10 ENCOUNTER — Encounter: Payer: Self-pay | Admitting: Adult Health

## 2017-04-10 ENCOUNTER — Ambulatory Visit (INDEPENDENT_AMBULATORY_CARE_PROVIDER_SITE_OTHER): Payer: Medicare Other | Admitting: Adult Health

## 2017-04-10 VITALS — BP 110/62 | HR 62 | Ht 64.0 in | Wt 153.0 lb

## 2017-04-10 DIAGNOSIS — E7800 Pure hypercholesterolemia, unspecified: Secondary | ICD-10-CM

## 2017-04-10 DIAGNOSIS — I10 Essential (primary) hypertension: Secondary | ICD-10-CM

## 2017-04-10 NOTE — Patient Instructions (Addendum)
Your physician wants you to follow-up in: 6 Months with Dr. Christoper Fabian.  You will receive a reminder letter in the mail two months in advance. If you don't receive a letter, please call our office to schedule the follow-up appointment.  Your physician recommends that you continue on your current medications as directed. Please refer to the Current Medication list given to you today.  If you need a refill on your cardiac medications before your next appointment, please call your pharmacy.  Thank you for choosing [facility]!

## 2017-04-10 NOTE — Progress Notes (Signed)
Cardiology Office Note   Date:  04/10/2017   ID:  Carol, Rosario 03-08-1941, MRN [id number]  PCP:  Glo Herring, MD  Cardiologist:  Woodroe Chen, NP   Chief Complaint  Patient presents with  . Hypertension      History of Present Illness: Carol Rosario is a 76 y.o. female who presents for posthospitalization follow-up after having episode of syncope, with history of anxiety hypothyroidism colon cancer hypertension hyperlipidemia former smoker, bifascicular block. She is diagnosed with dehydration. She was hydrated with alleviation of symptoms. She is treated for UTI. Consideration for outpatient event monitor if she remains symptomatic. On follow-up she was doing better but continued complaints of generalized fatigue and was scheduled for Lexiscan MPI, and bilateral Doppler studies due to carotid bruit noted on the right  03/26/2017  No diagnostic ST segment changes to indicate ischemia.  Moderate-sized, mild to moderate intensity, inferior defect that is fixed, more prominent on rest imaging and suggestive of attenuation artifact rather than scar. Normal wall motion noted in this area.  This is a low risk study.  Nuclear stress EF: 74%.   04/07/2017 Carotid artery ultrasound  IMPRESSION: Color duplex indicates moderate heterogeneous and calcified plaque, with no hemodynamically significant stenosis by duplex criteria in the extracranial cerebrovascular circulation.  She is without further complaint. No further complaints of weakness, some back pain from arthritis only.  Past Medical History:  Diagnosis Date  . Anxiety   . Bifascicular block   . Cancer ([place]) 08/2012   colon cancer  . Degeneration of lumbar or lumbosacral intervertebral disc   . DJD (degenerative joint disease)   . Family history of tobacco abuse and dependence   . History of recurrent UTIs   . Hyperlipidemia   . Hypertension   . Hypothyroidism   . Obesity   . Palpitations   .  PONV (postoperative nausea and vomiting)   . Sciatica   . Syncope 2008    Past Surgical History:  Procedure Laterality Date  . bilateral tubal ligation    . bladder resuspension    . CATARACT EXTRACTION, BILATERAL    . CHOLECYSTECTOMY    . FLEXIBLE SIGMOIDOSCOPY  08/26/2012   Procedure: FLEXIBLE SIGMOIDOSCOPY;  Surgeon: Daneil Dolin, MD;  Location: [facility];  Service: Endoscopy;  Laterality: N/A;  . HIP SURGERY  2011   left, [facility]  . KNEE SURGERY     left arthroscopy  . PARTIAL COLECTOMY  09/13/2012   Procedure: PARTIAL COLECTOMY;  Surgeon: Jamesetta So, MD;  Location: [facility] ORS;  Service: General;  Laterality: N/A;  . TOTAL HIP ARTHROPLASTY Right 05/02/2013   Procedure: TOTAL HIP ARTHROPLASTY;  Surgeon: Carole Civil, MD;  Location: [facility] ORS;  Service: Orthopedics;  Laterality: Right;  Right Total Hip Arthroplasty  . TUBAL LIGATION    . VESICOVAGINAL FISTULA CLOSURE W/ TAH       Current Outpatient Prescriptions  Medication Sig Dispense Refill  . ALPRAZolam (XANAX) 0.5 MG tablet Take 0.5 mg by mouth at bedtime as needed. Sleep    . aspirin EC 325 MG EC tablet Take 1 tablet (325 mg total) by mouth 2 (two) times daily. 30 tablet 0  . cefpodoxime (VANTIN) 200 MG tablet Take 1 tablet (200 mg total) by mouth 2 (two) times daily. 4 tablet 0  . Dextromethorphan-Guaifenesin (ROBITUSSIN DM) 10-100 MG/5ML liquid Take 5 mLs by mouth every 12 (twelve) hours. 120 mL 0  . fish oil-omega-3 fatty acids 1000 MG capsule  Take 2 g by mouth daily.    Marland Kitchen gabapentin (NEURONTIN) 100 MG capsule Take 1 capsule (100 mg total) by mouth 3 (three) times daily. (Patient taking differently: Take 200 mg by mouth 3 (three) times daily. ) 90 capsule 2  . HYDROcodone-acetaminophen (NORCO) 7.5-325 MG per tablet Take 1 tablet by mouth every 6 (six) hours as needed for moderate pain.    Marland Kitchen levothyroxine (SYNTHROID, LEVOTHROID) 50 MCG tablet Take 50 mcg by mouth daily.    Marland Kitchen lisinopril-hydrochlorothiazide  (PRINZIDE,ZESTORETIC) 20-12.5 MG tablet Take 1 tablet by mouth daily.    . methocarbamol (ROBAXIN) 500 MG tablet Take 1 tablet (500 mg total) by mouth every 6 (six) hours as needed. 60 tablet 1  . Probiotic Product ([place]) CAPS Take 1 capsule by mouth every other day.     No current facility-administered medications for this visit.     Allergies:   Codeine    Social History:  The patient  reports that she quit smoking about 6 years ago. Her smoking use included Cigarettes. She has never used smokeless tobacco. She reports that she does not drink alcohol or use drugs.   Family History:  The patient's family history is not on file.    ROS: All other systems are reviewed and negative. Unless otherwise mentioned in H&P    PHYSICAL EXAM: VS:  BP 110/62   Pulse 62   Ht 5\' 4"  (1.626 m)   Wt 153 lb (69.4 kg)   SpO2 95%   BMI 26.26 kg/m  , BMI Body mass index is 26.26 kg/m. GEN: Well nourished, well developed, in no acute distress  HEENT: normal  Neck: no JVD, carotid bruits, or masses Cardiac: RRR; no murmurs, rubs, or gallops,no edema  Respiratory:  clear to auscultation bilaterally, normal work of breathing GI: soft, nontender, nondistended, + BS MS: no deformity or atrophy feet are cool to touch, posterior pedal pulses are palpable. Skin: warm and dry, no rash Neuro:  Strength and sensation are intact Psych: euthymic mood, full affect    Recent Labs: 03/16/2017: ALT 15 03/19/2017: BUN 16; Creatinine, Ser 1.09; Hemoglobin 10.3; Platelets 135; Potassium 3.8; Sodium 139; TSH 2.999    Lipid Panel    Component Value Date/Time   CHOL 243 (H) 05/24/2012 0951   TRIG 288 (H) 05/24/2012 0951   HDL 39 (L) 05/24/2012 0951   CHOLHDL 6.2 05/24/2012 0951   VLDL 58 (H) 05/24/2012 0951   LDLCALC 146 (H) 05/24/2012 0951      Wt Readings from Last 3 Encounters:  04/10/17 153 lb (69.4 kg)  03/27/17 153 lb (69.4 kg)  03/19/17 157 lb 13.6 oz (71.6 kg)     ASSESSMENT  AND PLAN:  1.  Hypertension: Blood pressure is much better controlled. She is no longer having episodes of dizziness. Follow-up stress test and echocardiogram did not reveal evidence of ischemia causing fatigue. She will continue on current medication regimen. If she has episodes of lightheadedness or dizziness may need to adjust by removing HCTZ portion.  and follow up with [place] in 6 months  2. Carotid bruit: No evidence of ICA stenosis per  carotid Dopplers.  3. Hypercholesterolemia: Continue fish oil. Follow-up labs on next appointment.    Current medicines are reviewed at length with the patient today.    Labs/ tests ordered today include:  No orders of the defined types were placed in this encounter.    Disposition:   FU with  6 months   Signed, Curt Bears  Purcell Nails, NP  04/10/2017 3:52 PM    [facility] [address] [place], [place] [address] Phone: [phone]; Fax: [phone]

## 2017-04-14 ENCOUNTER — Encounter: Payer: Self-pay | Admitting: *Deleted

## 2017-05-04 ENCOUNTER — Other Ambulatory Visit (HOSPITAL_COMMUNITY): Payer: Self-pay | Admitting: Registered Nurse

## 2017-05-04 DIAGNOSIS — Z1231 Encounter for screening mammogram for malignant neoplasm of breast: Secondary | ICD-10-CM

## 2017-05-04 DIAGNOSIS — G894 Chronic pain syndrome: Secondary | ICD-10-CM | POA: Diagnosis not present

## 2017-05-04 DIAGNOSIS — E2839 Other primary ovarian failure: Secondary | ICD-10-CM

## 2017-05-04 DIAGNOSIS — N179 Acute kidney failure, unspecified: Secondary | ICD-10-CM | POA: Diagnosis not present

## 2017-05-04 DIAGNOSIS — Z79891 Long term (current) use of opiate analgesic: Secondary | ICD-10-CM | POA: Diagnosis not present

## 2017-05-04 DIAGNOSIS — Z1389 Encounter for screening for other disorder: Secondary | ICD-10-CM | POA: Diagnosis not present

## 2017-05-20 ENCOUNTER — Ambulatory Visit (HOSPITAL_COMMUNITY)
Admission: RE | Admit: 2017-05-20 | Discharge: 2017-05-20 | Disposition: A | Discharge status: Home / Self Care | Payer: Medicare Other | Source: Ambulatory Visit | Attending: Registered Nurse | Admitting: Registered Nurse

## 2017-05-20 ENCOUNTER — Other Ambulatory Visit (HOSPITAL_COMMUNITY): Payer: Medicare Other

## 2017-05-20 DIAGNOSIS — Z780 Asymptomatic menopausal state: Secondary | ICD-10-CM | POA: Diagnosis not present

## 2017-05-20 DIAGNOSIS — Z13820 Encounter for screening for osteoporosis: Secondary | ICD-10-CM | POA: Insufficient documentation

## 2017-05-20 DIAGNOSIS — E2839 Other primary ovarian failure: Secondary | ICD-10-CM

## 2017-05-20 DIAGNOSIS — Z1231 Encounter for screening mammogram for malignant neoplasm of breast: Secondary | ICD-10-CM | POA: Insufficient documentation

## 2017-05-20 DIAGNOSIS — M8588 Other specified disorders of bone density and structure, other site: Secondary | ICD-10-CM | POA: Diagnosis not present

## 2017-05-29 DIAGNOSIS — Z79891 Long term (current) use of opiate analgesic: Secondary | ICD-10-CM | POA: Diagnosis not present

## 2017-05-29 DIAGNOSIS — G629 Polyneuropathy, unspecified: Secondary | ICD-10-CM | POA: Diagnosis not present

## 2017-05-29 DIAGNOSIS — G8929 Other chronic pain: Secondary | ICD-10-CM | POA: Diagnosis not present

## 2017-06-08 DIAGNOSIS — R809 Proteinuria, unspecified: Secondary | ICD-10-CM | POA: Diagnosis not present

## 2017-06-08 DIAGNOSIS — N183 Chronic kidney disease, stage 3 (moderate): Secondary | ICD-10-CM | POA: Diagnosis not present

## 2017-06-08 DIAGNOSIS — I10 Essential (primary) hypertension: Secondary | ICD-10-CM | POA: Diagnosis not present

## 2017-06-08 DIAGNOSIS — Z79899 Other long term (current) drug therapy: Secondary | ICD-10-CM | POA: Diagnosis not present

## 2017-06-08 DIAGNOSIS — D509 Iron deficiency anemia, unspecified: Secondary | ICD-10-CM | POA: Diagnosis not present

## 2017-06-11 DIAGNOSIS — R809 Proteinuria, unspecified: Secondary | ICD-10-CM | POA: Diagnosis not present

## 2017-06-11 DIAGNOSIS — N183 Chronic kidney disease, stage 3 (moderate): Secondary | ICD-10-CM | POA: Diagnosis not present

## 2017-06-11 DIAGNOSIS — I10 Essential (primary) hypertension: Secondary | ICD-10-CM | POA: Diagnosis not present

## 2017-07-23 DIAGNOSIS — R809 Proteinuria, unspecified: Secondary | ICD-10-CM | POA: Diagnosis not present

## 2017-07-23 DIAGNOSIS — E559 Vitamin D deficiency, unspecified: Secondary | ICD-10-CM | POA: Diagnosis not present

## 2017-07-23 DIAGNOSIS — D509 Iron deficiency anemia, unspecified: Secondary | ICD-10-CM | POA: Diagnosis not present

## 2017-07-23 DIAGNOSIS — I10 Essential (primary) hypertension: Secondary | ICD-10-CM | POA: Diagnosis not present

## 2017-07-23 DIAGNOSIS — N183 Chronic kidney disease, stage 3 (moderate): Secondary | ICD-10-CM | POA: Diagnosis not present

## 2017-07-28 DIAGNOSIS — N183 Chronic kidney disease, stage 3 (moderate): Secondary | ICD-10-CM | POA: Diagnosis not present

## 2017-07-28 DIAGNOSIS — D649 Anemia, unspecified: Secondary | ICD-10-CM | POA: Diagnosis not present

## 2017-07-28 DIAGNOSIS — R809 Proteinuria, unspecified: Secondary | ICD-10-CM | POA: Diagnosis not present

## 2017-07-28 DIAGNOSIS — I10 Essential (primary) hypertension: Secondary | ICD-10-CM | POA: Diagnosis not present

## 2017-08-19 DIAGNOSIS — M79605 Pain in left leg: Secondary | ICD-10-CM | POA: Diagnosis not present

## 2017-08-20 ENCOUNTER — Other Ambulatory Visit (HOSPITAL_COMMUNITY): Payer: Self-pay | Admitting: Family Medicine

## 2017-08-20 ENCOUNTER — Ambulatory Visit (HOSPITAL_COMMUNITY)
Admission: RE | Admit: 2017-08-20 | Discharge: 2017-08-20 | Disposition: A | Discharge status: Home / Self Care | Payer: Medicare Other | Source: Ambulatory Visit | Attending: Family Medicine | Admitting: Family Medicine

## 2017-08-20 DIAGNOSIS — M8580 Other specified disorders of bone density and structure, unspecified site: Secondary | ICD-10-CM | POA: Insufficient documentation

## 2017-08-20 DIAGNOSIS — I70202 Unspecified atherosclerosis of native arteries of extremities, left leg: Secondary | ICD-10-CM | POA: Diagnosis not present

## 2017-08-20 DIAGNOSIS — M1712 Unilateral primary osteoarthritis, left knee: Secondary | ICD-10-CM | POA: Diagnosis not present

## 2017-08-20 DIAGNOSIS — M79605 Pain in left leg: Secondary | ICD-10-CM

## 2017-08-20 DIAGNOSIS — S8992XA Unspecified injury of left lower leg, initial encounter: Secondary | ICD-10-CM | POA: Diagnosis not present

## 2017-10-11 NOTE — Progress Notes (Signed)
Cardiology Office Note   Date:  10/12/2017   ID:  Carol Rosario, DOB Mar 04, 1941, MRN [id number]  PCP:  Redmond School, MD  Cardiologist:  Kate Sable, MD  Chief Complaint  Patient presents with  . Hypertension    History of Present Illness: Carol Rosario is a 76 y.o. female who presents for ongoing assessment and management of syncope, with other history to include HTN, Hyperlipidemia, Bifascicular block, and anxiety. On last office visit she was without complaints. No medications were changed.   She comes today without complaints.  She unfortunately continues to smoke.  The patient denies chest pain dyspnea on exertion.  She does complain occasionally of palpitations and heartburn after eating.  She is medically compliant.   Past Medical History:  Diagnosis Date  . Anxiety   . Bifascicular block   . Cancer ([place]) 08/2012   colon cancer  . Degeneration of lumbar or lumbosacral intervertebral disc   . DJD (degenerative joint disease)   . Family history of tobacco abuse and dependence   . History of recurrent UTIs   . Hyperlipidemia   . Hypertension   . Hypothyroidism   . Obesity   . Palpitations   . PONV (postoperative nausea and vomiting)   . Sciatica   . Syncope 2008    Past Surgical History:  Procedure Laterality Date  . bilateral tubal ligation    . bladder resuspension    . CATARACT EXTRACTION, BILATERAL    . CHOLECYSTECTOMY    . FLEXIBLE SIGMOIDOSCOPY  08/26/2012   Procedure: FLEXIBLE SIGMOIDOSCOPY;  Surgeon: Daneil Dolin, MD;  [facility];  Service: Endoscopy;  Laterality: N/A;  . HIP SURGERY  2011   left, [facility]  . KNEE SURGERY     left arthroscopy  . PARTIAL COLECTOMY  09/13/2012   Procedure: PARTIAL COLECTOMY;  Surgeon: Jamesetta So, MD;  Location: [facility];  Service: General;  Laterality: N/A;  . TOTAL HIP ARTHROPLASTY Right 05/02/2013   Procedure: TOTAL HIP ARTHROPLASTY;  Surgeon: Carole Civil, MD;  Location: [facility];  Service:  Orthopedics;  Laterality: Right;  Right Total Hip Arthroplasty  . TUBAL LIGATION    . VESICOVAGINAL FISTULA CLOSURE W/ TAH       Current Outpatient Prescriptions  Medication Sig Dispense Refill  . ALPRAZolam (XANAX) 0.5 MG tablet Take 0.5 mg by mouth at bedtime as needed. Sleep    . aspirin EC 325 MG EC tablet Take 1 tablet (325 mg total) by mouth 2 (two) times daily. (Patient taking differently: Take 325 mg by mouth daily. ) 30 tablet 0  . cefpodoxime (VANTIN) 200 MG tablet Take 1 tablet (200 mg total) by mouth 2 (two) times daily. 4 tablet 0  . fish oil-omega-3 fatty acids 1000 MG capsule Take 2 g by mouth daily.    Marland Kitchen gabapentin (NEURONTIN) 100 MG capsule Take 1 capsule (100 mg total) by mouth 3 (three) times daily. (Patient taking differently: Take 200 mg by mouth 3 (three) times daily. ) 90 capsule 2  . levothyroxine (SYNTHROID, LEVOTHROID) 50 MCG tablet Take 50 mcg by mouth daily.    Marland Kitchen lisinopril-hydrochlorothiazide (PRINZIDE,ZESTORETIC) 20-12.5 MG tablet Take 1 tablet by mouth daily.    . methocarbamol (ROBAXIN) 500 MG tablet Take 1 tablet (500 mg total) by mouth every 6 (six) hours as needed. 60 tablet 1   No current facility-administered medications for this visit.     Allergies:   Codeine    Social History:  The patient  reports that she quit smoking about 7 years ago. Her smoking use included Cigarettes. She has never used smokeless tobacco. She reports that she does not drink alcohol or use drugs.   Family History:  The patient's family history includes Arthritis in her unknown relative; Cancer in her unknown relative; Diabetes in her unknown relative.    ROS: All other systems are reviewed and negative. Unless otherwise mentioned in H&P    PHYSICAL EXAM: VS:  BP 124/64 (BP Location: Left Arm)   Pulse 90   Ht 5' 4" (1.626 m)   Wt 141 lb (64 kg)   SpO2 97%   BMI 24.20 kg/m  , BMI Body mass index is 24.2 kg/m. GEN: Well nourished, well developed, in no acute distress    HEENT: normal edentulous Neck: no JVD, carotid bruits, or masses Cardiac: RRR; no murmurs, rubs, or gallops,no edema  Respiratory:  clear to auscultation bilaterally, normal work of breathing GI: soft, nontender, nondistended, + BS MS: no deformity or atrophy, pulses are palpated at dorsalis pedis, but not posterior tibial. Skin: warm and dry, no rash Neuro:  Strength and sensation are intact Psych: euthymic mood, full affect  Recent Labs: 03/16/2017: ALT 15 03/19/2017: BUN 16; Creatinine, Ser 1.09; Hemoglobin 10.3; Platelets 135; Potassium 3.8; Sodium 139; TSH 2.999    Lipid Panel    Component Value Date/Time   CHOL 243 (H) 05/24/2012 0951   TRIG 288 (H) 05/24/2012 0951   HDL 39 (L) 05/24/2012 0951   CHOLHDL 6.2 05/24/2012 0951   VLDL 58 (H) 05/24/2012 0951   LDLCALC 146 (H) 05/24/2012 0951      Wt Readings from Last 3 Encounters:  10/12/17 141 lb (64 kg)  04/10/17 153 lb (69.4 kg)  03/27/17 153 lb (69.4 kg)      Other studies Reviewed: 03/26/2017  No diagnostic ST segment changes to indicate ischemia.  Moderate-sized, mild to moderate intensity, inferior defect that is fixed, more prominent on rest imaging and suggestive of attenuation artifact rather than scar. Normal wall motion noted in this area.  This is a low risk study.  Nuclear stress EF: 74%.  04/07/2017 Carotid artery ultrasound  IMPRESSION: Color duplex indicates moderate heterogeneous and calcified plaque, with no hemodynamically significant stenosis by duplex criteria in the extracranial cerebrovascular circulation.  ASSESSMENT AND PLAN:  1.  Hypertension: Blood pressures currently well controlled on medication regimen.  She will continue with lisinopril HCTZ.  I will check a be met for kidney function.  2.  Hypercholesterolemia: I will check fasting lipids and LFTs.  Continue fish oil.  I do not see that she is on statin therapy.  This may need to be instituted if she is able to tolerate.  I do not  find any drug allergies for statins.  3.  Ongoing tobacco abuse: She is counseled on smoking cessation.   Current medicines are reviewed at length with the patient today.    Labs/ tests ordered today include: BMET, CBC, LFTs, lipids.  Copy to Dr. Befakadu.    M.  DNP, ANP, AACC   10/12/2017 1:48 PM    [facility] [address], [place], [place] [address] Phone: [phone]; Fax: [phone] 

## 2017-10-12 ENCOUNTER — Other Ambulatory Visit (HOSPITAL_COMMUNITY)
Admission: RE | Admit: 2017-10-12 | Discharge: 2017-10-12 | Disposition: A | Discharge status: Home / Self Care | Payer: Medicare Other | Source: Ambulatory Visit | Attending: Adult Health | Admitting: Adult Health

## 2017-10-12 ENCOUNTER — Encounter: Payer: Self-pay | Admitting: Adult Health

## 2017-10-12 ENCOUNTER — Ambulatory Visit (INDEPENDENT_AMBULATORY_CARE_PROVIDER_SITE_OTHER): Payer: Medicare Other | Admitting: Adult Health

## 2017-10-12 VITALS — BP 124/64 | HR 90 | Ht 64.0 in | Wt 141.0 lb

## 2017-10-12 DIAGNOSIS — Z79899 Other long term (current) drug therapy: Secondary | ICD-10-CM | POA: Insufficient documentation

## 2017-10-12 DIAGNOSIS — I10 Essential (primary) hypertension: Secondary | ICD-10-CM | POA: Diagnosis not present

## 2017-10-12 DIAGNOSIS — E785 Hyperlipidemia, unspecified: Secondary | ICD-10-CM | POA: Diagnosis not present

## 2017-10-12 DIAGNOSIS — Z720 Tobacco use: Secondary | ICD-10-CM | POA: Diagnosis not present

## 2017-10-12 LAB — CBC WITH DIFFERENTIAL/PLATELET
Basophils Absolute: 0.1 10*3/uL (ref 0.0–0.1)
Basophils Relative: 1 %
Eosinophils Absolute: 0.1 10*3/uL (ref 0.0–0.7)
Eosinophils Relative: 2 %
HCT: 47.9 % — ABNORMAL HIGH (ref 36.0–46.0)
Hemoglobin: 15.2 g/dL — ABNORMAL HIGH (ref 12.0–15.0)
Lymphocytes Relative: 20 %
Lymphs Abs: 1.8 10*3/uL (ref 0.7–4.0)
MCH: 28.5 pg (ref 26.0–34.0)
MCHC: 31.7 g/dL (ref 30.0–36.0)
MCV: 89.7 fL (ref 78.0–100.0)
Monocytes Absolute: 0.6 10*3/uL (ref 0.1–1.0)
Monocytes Relative: 7 %
Neutro Abs: 6.3 10*3/uL (ref 1.7–7.7)
Neutrophils Relative %: 70 %
Platelets: 187 10*3/uL (ref 150–400)
RBC: 5.34 MIL/uL — ABNORMAL HIGH (ref 3.87–5.11)
RDW: 13.9 % (ref 11.5–15.5)
WBC: 9 10*3/uL (ref 4.0–10.5)

## 2017-10-12 LAB — LIPID PANEL
Cholesterol: 279 mg/dL — ABNORMAL HIGH (ref 0–200)
HDL: 50 mg/dL (ref >40)
LDL Cholesterol: 185 mg/dL — ABNORMAL HIGH (ref 0–99)
Total CHOL/HDL Ratio: 5.6 RATIO
Triglycerides: 220 mg/dL — ABNORMAL HIGH (ref <150)
VLDL: 44 mg/dL — ABNORMAL HIGH (ref 0–40)

## 2017-10-12 LAB — HEPATIC FUNCTION PANEL
ALT: 17 U/L (ref 14–54)
AST: 22 U/L (ref 15–41)
Albumin: 4.4 g/dL (ref 3.5–5.0)
Alkaline Phosphatase: 54 U/L (ref 38–126)
Bilirubin, Direct: 0.1 mg/dL (ref 0.1–0.5)
Indirect Bilirubin: 0.5 mg/dL (ref 0.3–0.9)
Total Bilirubin: 0.6 mg/dL (ref 0.3–1.2)
Total Protein: 7.4 g/dL (ref 6.5–8.1)

## 2017-10-12 LAB — BASIC METABOLIC PANEL
Anion gap: 9 (ref 5–15)
BUN: 22 mg/dL — ABNORMAL HIGH (ref 6–20)
CO2: 28 mmol/L (ref 22–32)
Calcium: 9.9 mg/dL (ref 8.9–10.3)
Chloride: 101 mmol/L (ref 101–111)
Creatinine, Ser: 1.24 mg/dL — ABNORMAL HIGH (ref 0.44–1.00)
GFR calc Af Amer: 48 mL/min — ABNORMAL LOW (ref >60)
GFR calc non Af Amer: 41 mL/min — ABNORMAL LOW (ref >60)
Glucose, Bld: 96 mg/dL (ref 65–99)
Potassium: 4.3 mmol/L (ref 3.5–5.1)
Sodium: 138 mmol/L (ref 135–145)

## 2017-10-12 NOTE — Patient Instructions (Signed)
Medication Instructions:  Your physician recommends that you continue on your current medications as directed. Please refer to the Current Medication list given to you today.   Labwork: ASAP  CBC BMET LIPID LFT'S   Testing/Procedures: NONE  Follow-Up: Your physician wants you to follow-up in: 6 MONTHS .  You will receive a reminder letter in the mail two months in advance. If you don't receive a letter, please call our office to schedule the follow-up appointment.   Any Other Special Instructions Will Be Listed Below (If Applicable).     If you need a refill on your cardiac medications before your next appointment, please call your pharmacy.

## 2017-10-13 ENCOUNTER — Telehealth: Payer: Self-pay | Admitting: *Deleted

## 2017-10-13 MED ORDER — ATORVASTATIN CALCIUM 80 MG PO TABS: 80.0000 mg | ORAL_TABLET | Freq: Every day | ORAL | 6 refills | Status: DC

## 2017-10-13 NOTE — Telephone Encounter (Signed)
-----   Message from Lendon Colonel, NP sent at 10/12/2017  4:02 PM EDT ----- Please begin atorvastatin 80 mg at HS as her cholesterol is high along with her LDL. She is on fish oil and should continue to take it. I have reviewed her allergies and she does not have any documented allergies or intolerances to statin therapy.Please make sure she does not.    I have reviewed her other labs. No evidence of anemia. Mildly elevated creatinine. Please send copy to Dr. Hinda Lenis. Thank you.

## 2017-10-26 DIAGNOSIS — G894 Chronic pain syndrome: Secondary | ICD-10-CM | POA: Diagnosis not present

## 2017-10-26 DIAGNOSIS — Z1389 Encounter for screening for other disorder: Secondary | ICD-10-CM | POA: Diagnosis not present

## 2017-10-26 DIAGNOSIS — Z0001 Encounter for general adult medical examination with abnormal findings: Secondary | ICD-10-CM | POA: Diagnosis not present

## 2017-10-26 DIAGNOSIS — Z23 Encounter for immunization: Secondary | ICD-10-CM | POA: Diagnosis not present

## 2017-10-26 DIAGNOSIS — I10 Essential (primary) hypertension: Secondary | ICD-10-CM | POA: Diagnosis not present

## 2017-10-26 DIAGNOSIS — E063 Autoimmune thyroiditis: Secondary | ICD-10-CM | POA: Diagnosis not present

## 2017-11-19 DIAGNOSIS — N183 Chronic kidney disease, stage 3 (moderate): Secondary | ICD-10-CM | POA: Diagnosis not present

## 2017-11-19 DIAGNOSIS — D509 Iron deficiency anemia, unspecified: Secondary | ICD-10-CM | POA: Diagnosis not present

## 2017-11-19 DIAGNOSIS — I10 Essential (primary) hypertension: Secondary | ICD-10-CM | POA: Diagnosis not present

## 2017-11-19 DIAGNOSIS — R809 Proteinuria, unspecified: Secondary | ICD-10-CM | POA: Diagnosis not present

## 2017-11-19 DIAGNOSIS — Z79899 Other long term (current) drug therapy: Secondary | ICD-10-CM | POA: Diagnosis not present

## 2017-11-26 DIAGNOSIS — N183 Chronic kidney disease, stage 3 (moderate): Secondary | ICD-10-CM | POA: Diagnosis not present

## 2017-11-26 DIAGNOSIS — D649 Anemia, unspecified: Secondary | ICD-10-CM | POA: Diagnosis not present

## 2017-11-26 DIAGNOSIS — I10 Essential (primary) hypertension: Secondary | ICD-10-CM | POA: Diagnosis not present

## 2017-11-26 DIAGNOSIS — R809 Proteinuria, unspecified: Secondary | ICD-10-CM | POA: Diagnosis not present

## 2018-01-12 DIAGNOSIS — G894 Chronic pain syndrome: Secondary | ICD-10-CM | POA: Diagnosis not present

## 2018-01-12 DIAGNOSIS — F419 Anxiety disorder, unspecified: Secondary | ICD-10-CM | POA: Diagnosis not present

## 2018-01-12 DIAGNOSIS — E6609 Other obesity due to excess calories: Secondary | ICD-10-CM | POA: Diagnosis not present

## 2018-01-12 DIAGNOSIS — Z6832 Body mass index (BMI) 32.0-32.9, adult: Secondary | ICD-10-CM | POA: Diagnosis not present

## 2018-02-15 ENCOUNTER — Other Ambulatory Visit: Payer: Self-pay | Admitting: Adult Health

## 2018-04-02 DIAGNOSIS — R809 Proteinuria, unspecified: Secondary | ICD-10-CM | POA: Diagnosis not present

## 2018-04-02 DIAGNOSIS — I10 Essential (primary) hypertension: Secondary | ICD-10-CM | POA: Diagnosis not present

## 2018-04-02 DIAGNOSIS — N183 Chronic kidney disease, stage 3 (moderate): Secondary | ICD-10-CM | POA: Diagnosis not present

## 2018-04-02 DIAGNOSIS — E559 Vitamin D deficiency, unspecified: Secondary | ICD-10-CM | POA: Diagnosis not present

## 2018-04-02 DIAGNOSIS — D509 Iron deficiency anemia, unspecified: Secondary | ICD-10-CM | POA: Diagnosis not present

## 2018-04-02 DIAGNOSIS — Z79899 Other long term (current) drug therapy: Secondary | ICD-10-CM | POA: Diagnosis not present

## 2018-04-06 DIAGNOSIS — N183 Chronic kidney disease, stage 3 (moderate): Secondary | ICD-10-CM | POA: Diagnosis not present

## 2018-04-06 DIAGNOSIS — I10 Essential (primary) hypertension: Secondary | ICD-10-CM | POA: Diagnosis not present

## 2018-04-06 DIAGNOSIS — R809 Proteinuria, unspecified: Secondary | ICD-10-CM | POA: Diagnosis not present

## 2018-04-06 DIAGNOSIS — E559 Vitamin D deficiency, unspecified: Secondary | ICD-10-CM | POA: Diagnosis not present

## 2018-04-06 DIAGNOSIS — D509 Iron deficiency anemia, unspecified: Secondary | ICD-10-CM | POA: Diagnosis not present

## 2018-04-21 DIAGNOSIS — F419 Anxiety disorder, unspecified: Secondary | ICD-10-CM | POA: Diagnosis not present

## 2018-04-21 DIAGNOSIS — Z1389 Encounter for screening for other disorder: Secondary | ICD-10-CM | POA: Diagnosis not present

## 2018-04-21 DIAGNOSIS — I10 Essential (primary) hypertension: Secondary | ICD-10-CM | POA: Diagnosis not present

## 2018-04-21 DIAGNOSIS — Z6831 Body mass index (BMI) 31.0-31.9, adult: Secondary | ICD-10-CM | POA: Diagnosis not present

## 2018-04-21 DIAGNOSIS — E6609 Other obesity due to excess calories: Secondary | ICD-10-CM | POA: Diagnosis not present

## 2018-04-21 DIAGNOSIS — G894 Chronic pain syndrome: Secondary | ICD-10-CM | POA: Diagnosis not present

## 2018-04-21 DIAGNOSIS — G4700 Insomnia, unspecified: Secondary | ICD-10-CM | POA: Diagnosis not present

## 2018-07-06 ENCOUNTER — Other Ambulatory Visit: Payer: Self-pay | Admitting: Cardiovascular Disease

## 2018-08-02 DIAGNOSIS — I10 Essential (primary) hypertension: Secondary | ICD-10-CM | POA: Diagnosis not present

## 2018-08-02 DIAGNOSIS — R809 Proteinuria, unspecified: Secondary | ICD-10-CM | POA: Diagnosis not present

## 2018-08-02 DIAGNOSIS — E559 Vitamin D deficiency, unspecified: Secondary | ICD-10-CM | POA: Diagnosis not present

## 2018-08-02 DIAGNOSIS — D509 Iron deficiency anemia, unspecified: Secondary | ICD-10-CM | POA: Diagnosis not present

## 2018-08-02 DIAGNOSIS — N183 Chronic kidney disease, stage 3 (moderate): Secondary | ICD-10-CM | POA: Diagnosis not present

## 2018-08-02 DIAGNOSIS — Z79899 Other long term (current) drug therapy: Secondary | ICD-10-CM | POA: Diagnosis not present

## 2018-08-10 DIAGNOSIS — R809 Proteinuria, unspecified: Secondary | ICD-10-CM | POA: Diagnosis not present

## 2018-08-10 DIAGNOSIS — D649 Anemia, unspecified: Secondary | ICD-10-CM | POA: Diagnosis not present

## 2018-08-10 DIAGNOSIS — N183 Chronic kidney disease, stage 3 (moderate): Secondary | ICD-10-CM | POA: Diagnosis not present

## 2018-08-10 DIAGNOSIS — I10 Essential (primary) hypertension: Secondary | ICD-10-CM | POA: Diagnosis not present

## 2018-08-11 DIAGNOSIS — M159 Polyosteoarthritis, unspecified: Secondary | ICD-10-CM | POA: Diagnosis not present

## 2018-08-11 DIAGNOSIS — F419 Anxiety disorder, unspecified: Secondary | ICD-10-CM | POA: Diagnosis not present

## 2018-08-11 DIAGNOSIS — B351 Tinea unguium: Secondary | ICD-10-CM | POA: Diagnosis not present

## 2018-08-11 DIAGNOSIS — E663 Overweight: Secondary | ICD-10-CM | POA: Diagnosis not present

## 2018-08-11 DIAGNOSIS — G894 Chronic pain syndrome: Secondary | ICD-10-CM | POA: Diagnosis not present

## 2018-08-11 DIAGNOSIS — Z6828 Body mass index (BMI) 28.0-28.9, adult: Secondary | ICD-10-CM | POA: Diagnosis not present

## 2018-12-27 DIAGNOSIS — D509 Iron deficiency anemia, unspecified: Secondary | ICD-10-CM | POA: Diagnosis not present

## 2018-12-27 DIAGNOSIS — N183 Chronic kidney disease, stage 3 (moderate): Secondary | ICD-10-CM | POA: Diagnosis not present

## 2018-12-27 DIAGNOSIS — R809 Proteinuria, unspecified: Secondary | ICD-10-CM | POA: Diagnosis not present

## 2018-12-27 DIAGNOSIS — E559 Vitamin D deficiency, unspecified: Secondary | ICD-10-CM | POA: Diagnosis not present

## 2018-12-27 DIAGNOSIS — Z79899 Other long term (current) drug therapy: Secondary | ICD-10-CM | POA: Diagnosis not present

## 2018-12-27 DIAGNOSIS — I10 Essential (primary) hypertension: Secondary | ICD-10-CM | POA: Diagnosis not present

## 2018-12-28 DIAGNOSIS — D649 Anemia, unspecified: Secondary | ICD-10-CM | POA: Diagnosis not present

## 2018-12-28 DIAGNOSIS — R809 Proteinuria, unspecified: Secondary | ICD-10-CM | POA: Diagnosis not present

## 2018-12-28 DIAGNOSIS — N183 Chronic kidney disease, stage 3 (moderate): Secondary | ICD-10-CM | POA: Diagnosis not present

## 2018-12-28 DIAGNOSIS — I10 Essential (primary) hypertension: Secondary | ICD-10-CM | POA: Diagnosis not present

## 2019-01-13 DIAGNOSIS — Z23 Encounter for immunization: Secondary | ICD-10-CM | POA: Diagnosis not present

## 2019-01-13 DIAGNOSIS — G894 Chronic pain syndrome: Secondary | ICD-10-CM | POA: Diagnosis not present

## 2019-03-09 IMAGING — CT CT ABD-PELV W/O CM
2 of 4 series · 16 of 46 positions shown, 18 images · non-contrast
Comparison: None.

CLINICAL DATA: Stomach cramps, nausea vomiting and diarrhea

EXAM:
CT ABDOMEN AND PELVIS WITHOUT CONTRAST
TECHNIQUE: Multidetector CT imaging of the abdomen and pelvis was performed
following the standard protocol without IV contrast.

[Series 2: axial st · axial · 0.69mm/px · z∈[+739,+1139]mm · 13 of 89 slices shown, 15 images]
[im 5/89  soft-tissue]
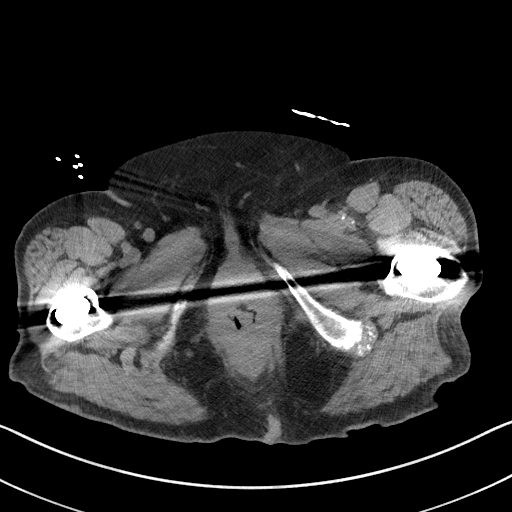
[im 5/89  bone]
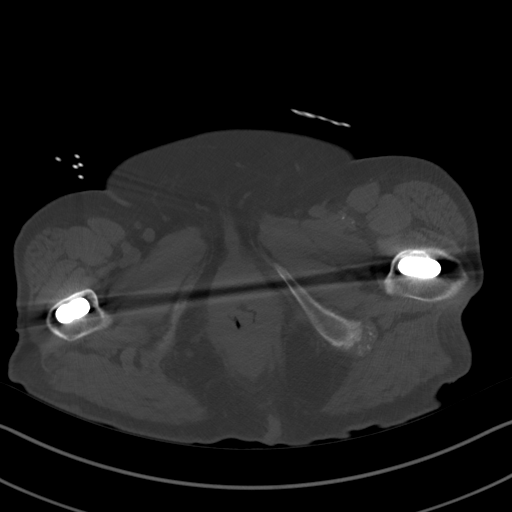
[im 13/89  soft-tissue]
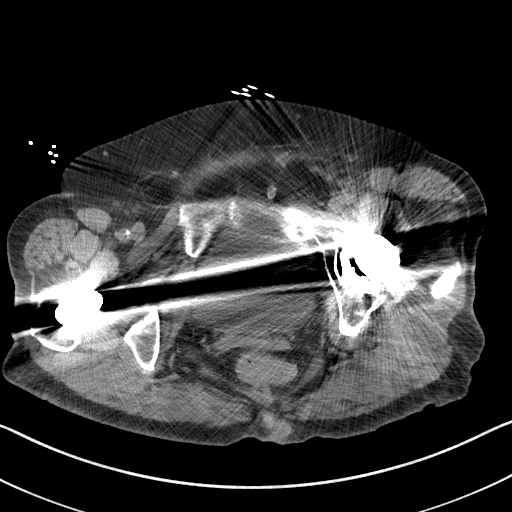
[im 21/89  soft-tissue]
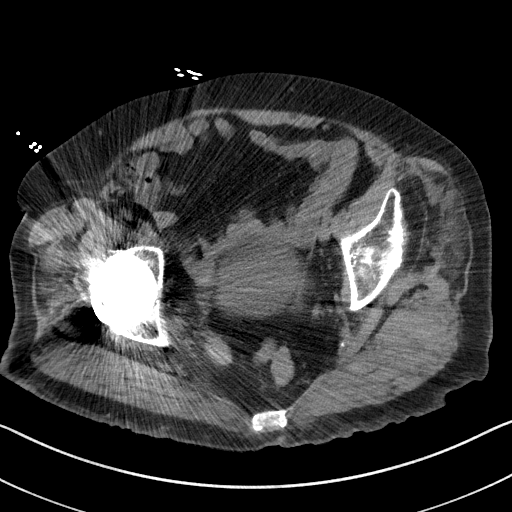
[im 25/89  soft-tissue]
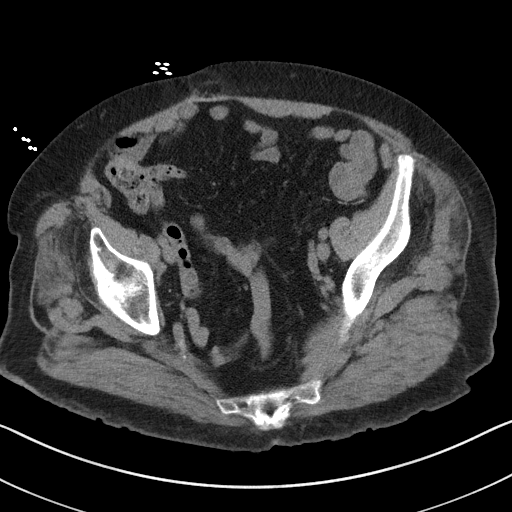
[im 33/89  soft-tissue]
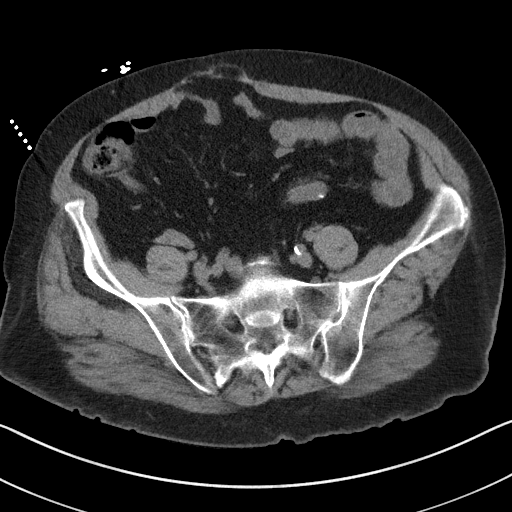
[im 37/89  soft-tissue]
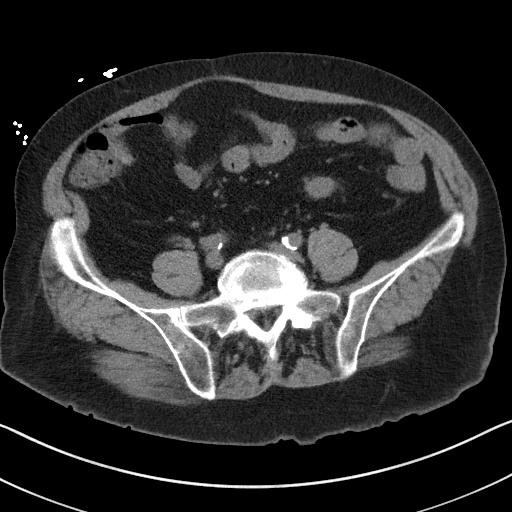
[im 45/89  soft-tissue]
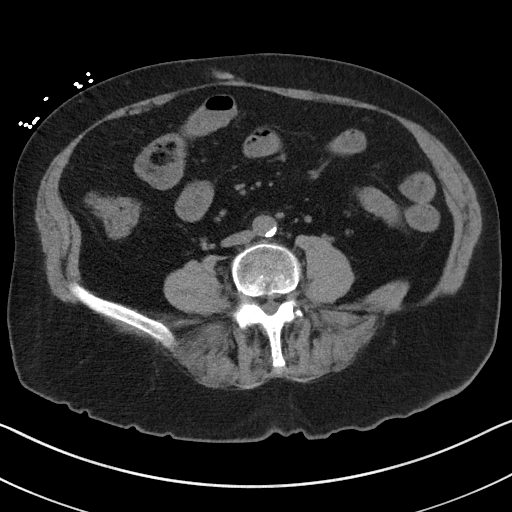
[im 53/89  soft-tissue]
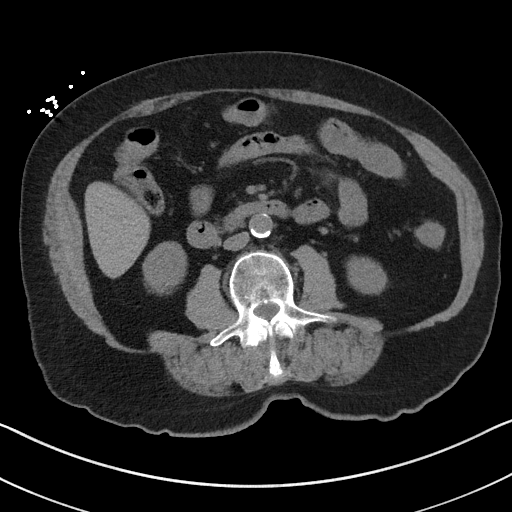
[im 57/89  soft-tissue]
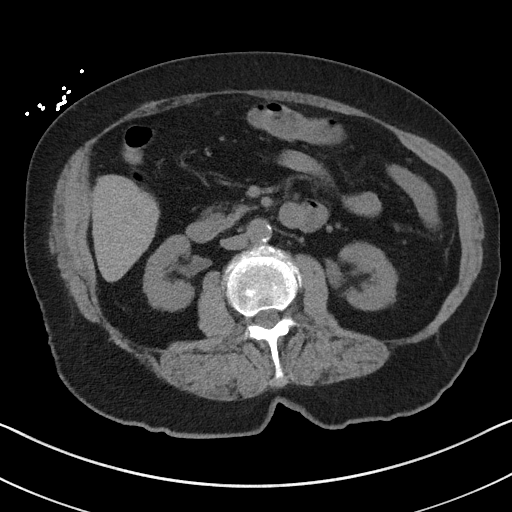
[im 57/89  bone]
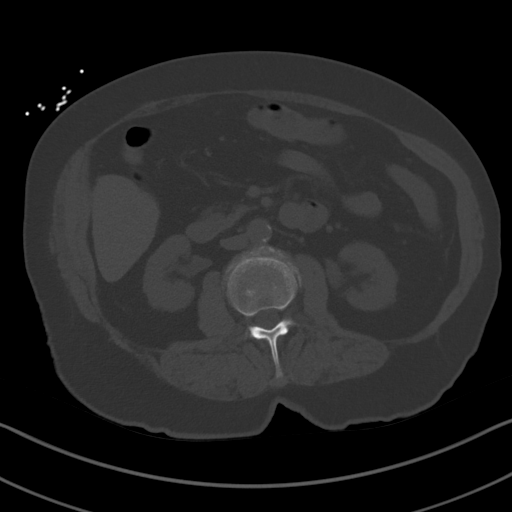
[im 65/89  soft-tissue]
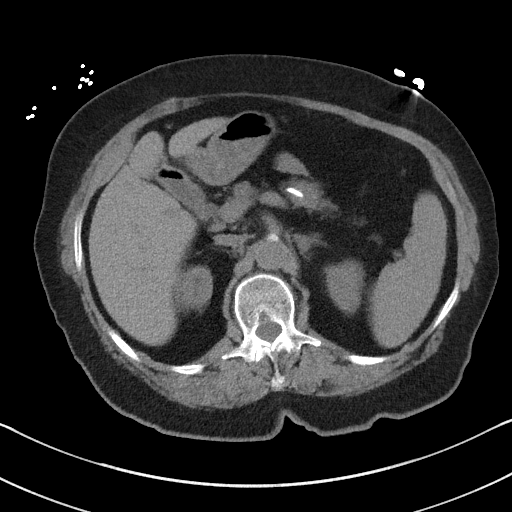
[im 69/89  soft-tissue]
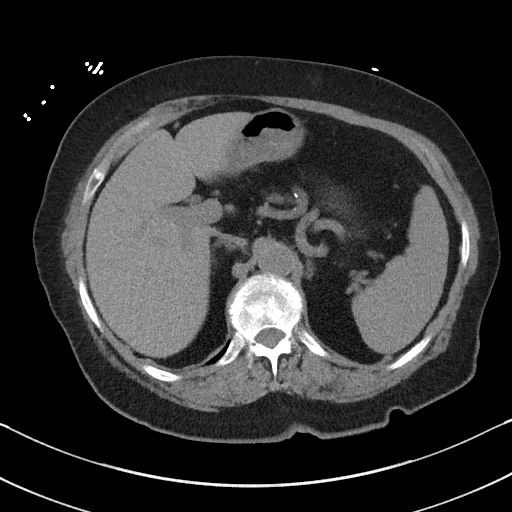
[im 77/89  soft-tissue]
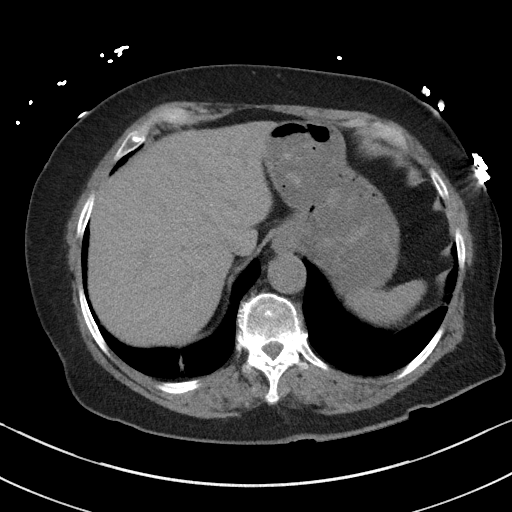
[im 85/89  soft-tissue]
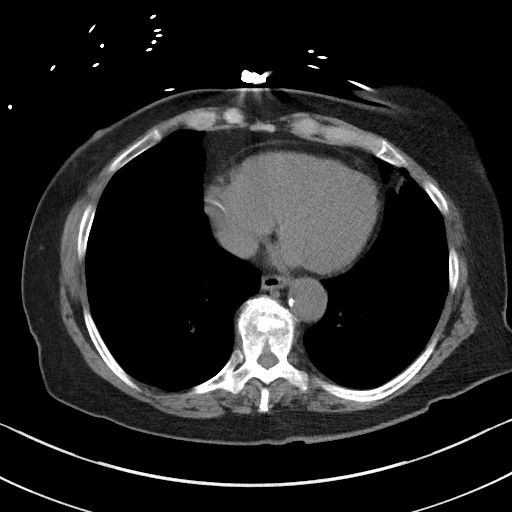

[Series 5: coronal st · coronal · 0.78mm/px · 3 of 98 slices shown]
[im 33/98  soft-tissue]
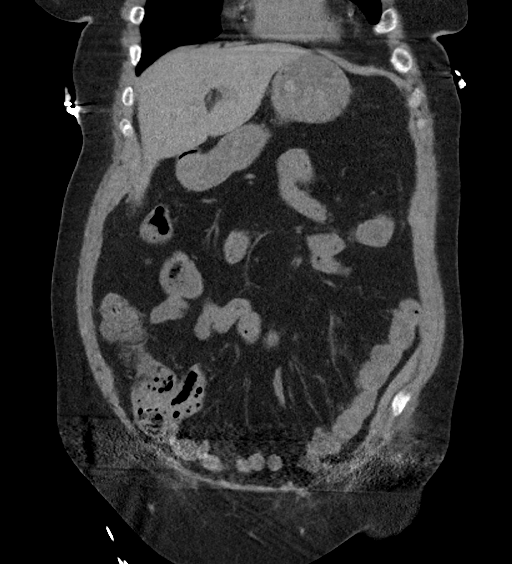
[im 44/98  soft-tissue]
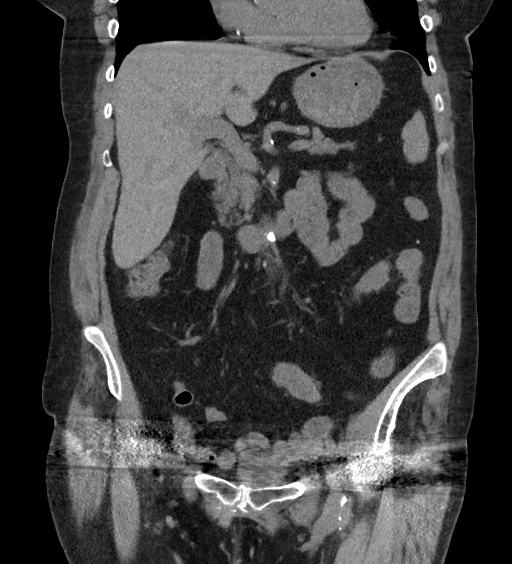
[im 54/98  soft-tissue]
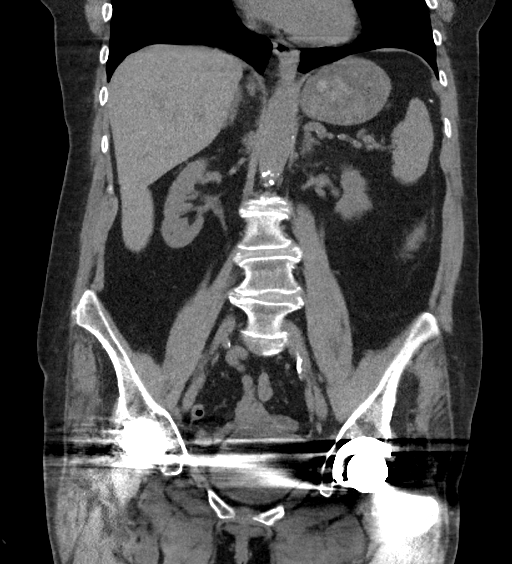

[16 of 46 positions shown; findings below may reference images not displayed]

FINDINGS: Lower chest: Lung bases demonstrate scarring in the right lower
lobe. Irregular density in the lingula, suspect scarring. No pleural
effusion. Nonenlarged heart.

Hepatobiliary: 2.2 cm low-density lesion in the right hepatic lobe.
No other focal hepatic abnormalities are seen. Surgical absence of
the gallbladder. No biliary dilatation.

Pancreas: Coarse pancreatic calcification.  No inflammation.

Spleen: Normal in size without focal abnormality.

Adrenals/Urinary Tract: Adrenal glands are within normal limits. No
hydronephrosis. The bladder is unremarkable but obscured by
artifact.

Stomach/Bowel: No dilated small bowel. The stomach contains debris
postsurgical changes of the sigmoid colon. No wall thickening.
Appendix not well identified but no right lower quadrant
inflammation

Vascular/Lymphatic: Atherosclerosis. No aneurysmal dilatation. No
grossly enlarged lymph nodes

Reproductive: Status post hysterectomy. No adnexal masses.
Rectovaginal soft tissue fullness with air in the vagina.

Other: No free air. No significant free fluid. Fat containing
infraumbilical ventral hernia.

Musculoskeletal: Degenerative changes. No acute or suspicious bone
lesion.
IMPRESSION: 1. Negative for bowel obstruction or acute inflammatory process
2. 2.2 cm indeterminate hypodense lesion in the right hepatic lobe ;
further evaluation with contrast-enhanced CT or MRI may be obtained
on a nonemergent basis.
3. Small fat containing ventral hernia.
4. Rectovaginal soft tissue fullness with small amount of air in the
vagina, correlate with physical exam.

## 2019-03-14 DIAGNOSIS — G894 Chronic pain syndrome: Secondary | ICD-10-CM | POA: Diagnosis not present

## 2019-04-26 ENCOUNTER — Other Ambulatory Visit: Payer: Self-pay | Admitting: Cardiovascular Disease

## 2019-05-02 DIAGNOSIS — I10 Essential (primary) hypertension: Secondary | ICD-10-CM | POA: Diagnosis not present

## 2019-05-02 DIAGNOSIS — Z0001 Encounter for general adult medical examination with abnormal findings: Secondary | ICD-10-CM | POA: Diagnosis not present

## 2019-05-02 DIAGNOSIS — Z1389 Encounter for screening for other disorder: Secondary | ICD-10-CM | POA: Diagnosis not present

## 2019-05-02 DIAGNOSIS — G894 Chronic pain syndrome: Secondary | ICD-10-CM | POA: Diagnosis not present

## 2019-07-05 DIAGNOSIS — M159 Polyosteoarthritis, unspecified: Secondary | ICD-10-CM | POA: Diagnosis not present

## 2019-07-05 DIAGNOSIS — G894 Chronic pain syndrome: Secondary | ICD-10-CM | POA: Diagnosis not present

## 2019-08-03 ENCOUNTER — Other Ambulatory Visit (HOSPITAL_COMMUNITY): Payer: Self-pay | Admitting: Physician Assistant

## 2019-08-03 DIAGNOSIS — Z1231 Encounter for screening mammogram for malignant neoplasm of breast: Secondary | ICD-10-CM

## 2019-08-04 ENCOUNTER — Ambulatory Visit (HOSPITAL_COMMUNITY)
Admission: RE | Admit: 2019-08-04 | Discharge: 2019-08-04 | Disposition: A | Discharge status: Home / Self Care | Payer: Medicare Other | Source: Ambulatory Visit | Attending: Physician Assistant | Admitting: Physician Assistant

## 2019-08-04 ENCOUNTER — Other Ambulatory Visit: Payer: Self-pay

## 2019-08-04 DIAGNOSIS — M159 Polyosteoarthritis, unspecified: Secondary | ICD-10-CM | POA: Diagnosis not present

## 2019-08-04 DIAGNOSIS — G894 Chronic pain syndrome: Secondary | ICD-10-CM | POA: Diagnosis not present

## 2019-08-04 DIAGNOSIS — Z1231 Encounter for screening mammogram for malignant neoplasm of breast: Secondary | ICD-10-CM | POA: Diagnosis not present

## 2019-08-04 DIAGNOSIS — I10 Essential (primary) hypertension: Secondary | ICD-10-CM | POA: Diagnosis not present

## 2019-10-01 ENCOUNTER — Other Ambulatory Visit: Payer: Self-pay

## 2019-10-01 ENCOUNTER — Encounter (HOSPITAL_COMMUNITY): Payer: Self-pay | Admitting: Emergency Medicine

## 2019-10-01 ENCOUNTER — Emergency Department (HOSPITAL_COMMUNITY)
Admission: EM | Admit: 2019-10-01 | Discharge: 2019-10-01 | Disposition: A | Discharge status: Home / Self Care | Payer: Medicare Other | Attending: Emergency Medicine | Admitting: Emergency Medicine

## 2019-10-01 DIAGNOSIS — E039 Hypothyroidism, unspecified: Secondary | ICD-10-CM | POA: Insufficient documentation

## 2019-10-01 DIAGNOSIS — I10 Essential (primary) hypertension: Secondary | ICD-10-CM | POA: Insufficient documentation

## 2019-10-01 DIAGNOSIS — H6692 Otitis media, unspecified, left ear: Secondary | ICD-10-CM

## 2019-10-01 DIAGNOSIS — H9202 Otalgia, left ear: Secondary | ICD-10-CM | POA: Diagnosis present

## 2019-10-01 DIAGNOSIS — Z79899 Other long term (current) drug therapy: Secondary | ICD-10-CM | POA: Diagnosis not present

## 2019-10-01 DIAGNOSIS — M25561 Pain in right knee: Secondary | ICD-10-CM | POA: Insufficient documentation

## 2019-10-01 DIAGNOSIS — Z87891 Personal history of nicotine dependence: Secondary | ICD-10-CM | POA: Diagnosis not present

## 2019-10-01 DIAGNOSIS — Z96641 Presence of right artificial hip joint: Secondary | ICD-10-CM | POA: Insufficient documentation

## 2019-10-01 DIAGNOSIS — Z7982 Long term (current) use of aspirin: Secondary | ICD-10-CM | POA: Diagnosis not present

## 2019-10-01 MED ORDER — AZITHROMYCIN 250 MG PO TABS: 250.0000 mg | ORAL_TABLET | Freq: Every day | ORAL | 0 refills | Status: DC

## 2019-10-01 MED ORDER — HYDROCODONE-ACETAMINOPHEN 5-325 MG PO TABS: 1.0000 | ORAL_TABLET | Freq: Four times a day (QID) | ORAL | 0 refills | Status: AC | PRN

## 2019-10-01 NOTE — ED Provider Notes (Signed)
[facility] EMERGENCY DEPARTMENT Provider Note   CSN: [id number] Arrival date & time: 10/01/19  1442     History   Chief Complaint Chief Complaint  Patient presents with  . Otalgia  . Knee Pain    HPI Carol Rosario is a 78 y.o. female.     Patient presenting with a complaint of left ear pain for several days.  Is been getting progressively worse.  Denies any fevers upper respiratory symptoms.  Patient also appears from chart review to have the chronic joint pain.  Patient is on pain medications through her primary care doctor on a monthly basis.  Patient last had prescription filled September 20.  Patient states she is out of pain medicine.  Patient also complaining of joint pain predominantly her right knee ankle.  No fall or injury.  These pains are nothing new.     Past Medical History:  Diagnosis Date  . Anxiety   . Bifascicular block   . Cancer ([place]) 08/2012   colon cancer  . Degeneration of lumbar or lumbosacral intervertebral disc   . DJD (degenerative joint disease)   . Family history of tobacco abuse and dependence   . History of recurrent UTIs   . Hyperlipidemia   . Hypertension   . Hypothyroidism   . Obesity   . Palpitations   . PONV (postoperative nausea and vomiting)   . Sciatica   . Syncope 2008    Patient Active Problem List   Diagnosis Date Noted  . Hypotension 03/18/2017  . Abnormal abdominal CT scan 03/17/2017  . Acute lower UTI 03/17/2017  . Dehydration 03/17/2017  . AKI (acute kidney injury) ([place]) 03/17/2017  . Bradycardia 03/17/2017  . Bifascicular block 03/17/2017  . Syncope and collapse 03/17/2017  . Influenza 12/23/2013  . S/P total hip arthroplasty 05/16/2013  . Degenerative arthritis of hip 05/02/2013  . Radicular leg pain 03/15/2013  . Back pain 03/15/2013  . Arthritis pain of hip 03/15/2013  . Anemia 08/09/2012  . Heme positive stool 08/09/2012  . HTN (hypertension) 05/18/2012  . Osteoarthritis of hip 10/23/2011  . Hip pain  10/23/2011  . Arthritis of knee, degenerative 09/09/2011  . TOTAL HIP FOLLOW-UP 08/27/2010  . HIP, ARTHRITIS, DEGEN./OSTEO 07/29/2010  . OBESITY 01/16/2010  . DENTAL CARIES 01/16/2010  . SYNCOPE, HX OF 01/16/2010  . 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 SCIATICA 04/17/2008    Past Surgical History:  Procedure Laterality Date  . bilateral tubal ligation    . bladder resuspension    . CATARACT EXTRACTION, BILATERAL    . CHOLECYSTECTOMY    . FLEXIBLE SIGMOIDOSCOPY  08/26/2012   Procedure: FLEXIBLE SIGMOIDOSCOPY;  Surgeon: Daneil Dolin, MD;  Location[facility];  Service: Endoscopy;  Laterality: N/A;  . HIP SURGERY  2011   left, [facility]  . KNEE SURGERY     left arthroscopy  . PARTIAL COLECTOMY  09/13/2012   Procedure: PARTIAL COLECTOMY;  Surgeon: Jamesetta So, MD;  Location: [facility];  Service: General;  Laterality: N/A;  . TOTAL HIP ARTHROPLASTY Right 05/02/2013   Procedure: TOTAL HIP ARTHROPLASTY;  Surgeon: Carole Civil, MD;  Location: [facility];  Service: Orthopedics;  Laterality: Right;  Right Total Hip Arthroplasty  . TUBAL LIGATION    . VESICOVAGINAL FISTULA CLOSURE W/ TAH       OB History   No obstetric history on file.      Home Medications    Prior to Admission medications   Medication  Sig Start Date End Date Taking? Authorizing Provider  ALPRAZolam Duanne Moron) 0.5 MG tablet Take 0.5 mg by mouth at bedtime as needed. Sleep 06/21/12   [provider]  aspirin EC 325 MG EC tablet Take 1 tablet (325 mg total) by mouth 2 (two) times daily. Patient taking differently: Take 325 mg by mouth daily.  05/05/13   Carole Civil, MD  atorvastatin (LIPITOR) 80 MG tablet TAKE 1 TABLET BY MOUTH ONCE A DAY. 04/26/19   Herminio Commons, MD  azithromycin (ZITHROMAX) 250 MG tablet Take 1 tablet (250 mg total) by mouth daily. Take first 2 tablets together, then 1 every day until finished. 10/01/19   Fredia Sorrow, MD  cefpodoxime (VANTIN) 200 MG tablet  Take 1 tablet (200 mg total) by mouth 2 (two) times daily. 03/19/17   Thurnell Lose, MD  fish oil-omega-3 fatty acids 1000 MG capsule Take 2 g by mouth daily.    [provider]  gabapentin (NEURONTIN) 100 MG capsule Take 1 capsule (100 mg total) by mouth 3 (three) times daily. Patient taking differently: Take 200 mg by mouth 3 (three) times daily.  03/15/13   Carole Civil, MD  HYDROcodone-acetaminophen (NORCO/VICODIN) 5-325 MG tablet Take 1-2 tablets by mouth every 6 (six) hours as needed. 10/01/19   Fredia Sorrow, MD  levothyroxine (SYNTHROID, LEVOTHROID) 50 MCG tablet Take 50 mcg by mouth daily.    [provider]  lisinopril-hydrochlorothiazide (PRINZIDE,ZESTORETIC) 20-12.5 MG tablet Take 1 tablet by mouth daily. 03/21/17   Thurnell Lose, MD  methocarbamol (ROBAXIN) 500 MG tablet Take 1 tablet (500 mg total) by mouth every 6 (six) hours as needed. 11/03/13   Carole Civil, MD    Family History Family History  Problem Relation Age of Onset  . Arthritis Other   . Cancer Other   . Diabetes Other   . Heart disease Neg Hx     Social History Social History   Tobacco Use  . Smoking status: Former Smoker    Types: Cigarettes    Quit date: 08/13/2010    Years since quitting: 9.1  . Smokeless tobacco: Never Used  Substance Use Topics  . Alcohol use: No  . Drug use: No     Allergies   Codeine   Review of Systems Review of Systems  Constitutional: Negative for chills and fever.  HENT: Positive for ear pain. Negative for congestion, rhinorrhea and sore throat.   Eyes: Negative for visual disturbance.  Respiratory: Negative for cough and shortness of breath.   Cardiovascular: Negative for chest pain and leg swelling.  Gastrointestinal: Negative for abdominal pain, diarrhea, nausea and vomiting.  Genitourinary: Negative for dysuria.  Musculoskeletal: Positive for arthralgias. Negative for back pain, joint swelling and neck pain.  Skin: Negative  for rash.  Neurological: Negative for dizziness, light-headedness and headaches.  Hematological: Does not bruise/bleed easily.  Psychiatric/Behavioral: Negative for confusion.     Physical Exam Updated Vital Signs BP (!) 155/87 (BP Location: Left Arm)   Pulse 60   Temp 97.8 F (36.6 C) (Oral)   Resp 16   Ht 1.651 m (5\' 5" )   Wt 59 kg   SpO2 99%   BMI 21.63 kg/m   Physical Exam Vitals signs and nursing note reviewed.  Constitutional:      General: She is not in acute distress.    Appearance: Normal appearance. She is well-developed.  HENT:     Head: Normocephalic and atraumatic.     Right Ear: Tympanic  membrane, ear canal and external ear normal.     Ears:     Comments: Left TM mild erythema and bulging.  External canal normal. Eyes:     Extraocular Movements: Extraocular movements intact.     Conjunctiva/sclera: Conjunctivae normal.     Pupils: Pupils are equal, round, and reactive to light.  Neck:     Musculoskeletal: Normal range of motion and neck supple.  Cardiovascular:     Rate and Rhythm: Normal rate and regular rhythm.     Heart sounds: No murmur.  Pulmonary:     Effort: Pulmonary effort is normal. No respiratory distress.     Breath sounds: Normal breath sounds.  Abdominal:     General: Bowel sounds are normal.     Palpations: Abdomen is soft.     Tenderness: There is no abdominal tenderness.  Musculoskeletal: Normal range of motion.        General: No swelling or tenderness.     Comments: Right knee right ankle right leg no swelling no joint pain with range of motion no erythema.  Skin:    General: Skin is warm and dry.     Capillary Refill: Capillary refill takes less than 2 seconds.  Neurological:     General: No focal deficit present.     Mental Status: She is alert and oriented to person, place, and time.      ED Treatments / Results  Labs (all labs ordered are listed, but only abnormal results are displayed) Labs Reviewed - No data to  display  EKG None  Radiology No results found.  Procedures Procedures (including critical care time)  Medications Ordered in ED Medications - No data to display   Initial Impression / Assessment and Plan / ED Course  I have reviewed the triage vital signs and the nursing notes.  Pertinent labs & imaging results that were available during my care of the patient were reviewed by me and considered in my medical decision making (see chart for details).        Exam clearly consistent with a left otitis media.  Patient's have right knee and ankle and leg without any swelling or any evidence of any acute injury.  Feel that this is chronic pain there.  We will treat the otitis with azithromycin and a very short course of hydrocodone.  Patient has an appointment this week with primary care doctor already.  Patient will return for any new or worse symptoms.  Patient nontoxic no acute distress.  Final Clinical Impressions(s) / ED Diagnoses   Final diagnoses:  Left otitis media, unspecified otitis media type    ED Discharge Orders         Ordered    azithromycin (ZITHROMAX) 250 MG tablet  Daily     10/01/19 1528    HYDROcodone-acetaminophen (NORCO/VICODIN) 5-325 MG tablet  Every 6 hours PRN     10/01/19 1528           Fredia Sorrow, MD 10/01/19 1535

## 2019-10-01 NOTE — ED Triage Notes (Signed)
Patient complains of left ear pain x 1 week and right knee pain. NAD

## 2019-10-01 NOTE — Discharge Instructions (Addendum)
Take the antibiotic Zithromax as directed for the left ear infection.  Would expect improvement over the next 2 days.  Take the hydrocodone as needed for pain.  Make an appointment to follow-up with your regular doctor.

## 2019-10-01 NOTE — ED Notes (Signed)
Pt complains of R knee pain and ear ache for a week  Dr Earnest Conroy has evaluated

## 2019-10-05 DIAGNOSIS — G8929 Other chronic pain: Secondary | ICD-10-CM | POA: Diagnosis not present

## 2019-10-05 DIAGNOSIS — H6090 Unspecified otitis externa, unspecified ear: Secondary | ICD-10-CM | POA: Diagnosis not present

## 2019-10-05 DIAGNOSIS — M159 Polyosteoarthritis, unspecified: Secondary | ICD-10-CM | POA: Diagnosis not present

## 2019-10-05 DIAGNOSIS — R413 Other amnesia: Secondary | ICD-10-CM | POA: Diagnosis not present

## 2019-10-05 DIAGNOSIS — M1991 Primary osteoarthritis, unspecified site: Secondary | ICD-10-CM | POA: Diagnosis not present

## 2019-11-07 DIAGNOSIS — G8929 Other chronic pain: Secondary | ICD-10-CM | POA: Diagnosis not present

## 2019-11-07 DIAGNOSIS — M159 Polyosteoarthritis, unspecified: Secondary | ICD-10-CM | POA: Diagnosis not present

## 2019-11-14 DIAGNOSIS — E7849 Other hyperlipidemia: Secondary | ICD-10-CM | POA: Diagnosis not present

## 2019-11-14 DIAGNOSIS — N1832 Chronic kidney disease, stage 3b: Secondary | ICD-10-CM | POA: Diagnosis not present

## 2019-11-14 DIAGNOSIS — I129 Hypertensive chronic kidney disease with stage 1 through stage 4 chronic kidney disease, or unspecified chronic kidney disease: Secondary | ICD-10-CM | POA: Diagnosis not present

## 2019-12-02 DIAGNOSIS — M159 Polyosteoarthritis, unspecified: Secondary | ICD-10-CM | POA: Diagnosis not present

## 2019-12-15 DIAGNOSIS — I129 Hypertensive chronic kidney disease with stage 1 through stage 4 chronic kidney disease, or unspecified chronic kidney disease: Secondary | ICD-10-CM | POA: Diagnosis not present

## 2019-12-15 DIAGNOSIS — E063 Autoimmune thyroiditis: Secondary | ICD-10-CM | POA: Diagnosis not present

## 2019-12-15 DIAGNOSIS — G894 Chronic pain syndrome: Secondary | ICD-10-CM | POA: Diagnosis not present

## 2019-12-30 DIAGNOSIS — E063 Autoimmune thyroiditis: Secondary | ICD-10-CM | POA: Diagnosis not present

## 2019-12-30 DIAGNOSIS — E7849 Other hyperlipidemia: Secondary | ICD-10-CM | POA: Diagnosis not present

## 2019-12-30 DIAGNOSIS — Z0000 Encounter for general adult medical examination without abnormal findings: Secondary | ICD-10-CM | POA: Diagnosis not present

## 2019-12-30 DIAGNOSIS — I10 Essential (primary) hypertension: Secondary | ICD-10-CM | POA: Diagnosis not present

## 2019-12-30 DIAGNOSIS — Z1389 Encounter for screening for other disorder: Secondary | ICD-10-CM | POA: Diagnosis not present

## 2020-01-15 DIAGNOSIS — I129 Hypertensive chronic kidney disease with stage 1 through stage 4 chronic kidney disease, or unspecified chronic kidney disease: Secondary | ICD-10-CM | POA: Diagnosis not present

## 2020-01-15 DIAGNOSIS — E7849 Other hyperlipidemia: Secondary | ICD-10-CM | POA: Diagnosis not present

## 2020-01-15 DIAGNOSIS — E063 Autoimmune thyroiditis: Secondary | ICD-10-CM | POA: Diagnosis not present

## 2020-01-15 DIAGNOSIS — N1832 Chronic kidney disease, stage 3b: Secondary | ICD-10-CM | POA: Diagnosis not present

## 2020-02-12 DIAGNOSIS — E7849 Other hyperlipidemia: Secondary | ICD-10-CM | POA: Diagnosis not present

## 2020-02-12 DIAGNOSIS — N1832 Chronic kidney disease, stage 3b: Secondary | ICD-10-CM | POA: Diagnosis not present

## 2020-02-12 DIAGNOSIS — E063 Autoimmune thyroiditis: Secondary | ICD-10-CM | POA: Diagnosis not present

## 2020-02-12 DIAGNOSIS — I129 Hypertensive chronic kidney disease with stage 1 through stage 4 chronic kidney disease, or unspecified chronic kidney disease: Secondary | ICD-10-CM | POA: Diagnosis not present

## 2020-03-14 DIAGNOSIS — E063 Autoimmune thyroiditis: Secondary | ICD-10-CM | POA: Diagnosis not present

## 2020-03-14 DIAGNOSIS — I129 Hypertensive chronic kidney disease with stage 1 through stage 4 chronic kidney disease, or unspecified chronic kidney disease: Secondary | ICD-10-CM | POA: Diagnosis not present

## 2020-03-14 DIAGNOSIS — E7849 Other hyperlipidemia: Secondary | ICD-10-CM | POA: Diagnosis not present

## 2020-03-14 DIAGNOSIS — N1832 Chronic kidney disease, stage 3b: Secondary | ICD-10-CM | POA: Diagnosis not present

## 2020-04-13 DIAGNOSIS — I129 Hypertensive chronic kidney disease with stage 1 through stage 4 chronic kidney disease, or unspecified chronic kidney disease: Secondary | ICD-10-CM | POA: Diagnosis not present

## 2020-04-13 DIAGNOSIS — E063 Autoimmune thyroiditis: Secondary | ICD-10-CM | POA: Diagnosis not present

## 2020-04-13 DIAGNOSIS — E7849 Other hyperlipidemia: Secondary | ICD-10-CM | POA: Diagnosis not present

## 2020-04-13 DIAGNOSIS — N1832 Chronic kidney disease, stage 3b: Secondary | ICD-10-CM | POA: Diagnosis not present

## 2020-05-14 DIAGNOSIS — E7849 Other hyperlipidemia: Secondary | ICD-10-CM | POA: Diagnosis not present

## 2020-05-14 DIAGNOSIS — N1832 Chronic kidney disease, stage 3b: Secondary | ICD-10-CM | POA: Diagnosis not present

## 2020-05-14 DIAGNOSIS — E063 Autoimmune thyroiditis: Secondary | ICD-10-CM | POA: Diagnosis not present

## 2020-05-14 DIAGNOSIS — I129 Hypertensive chronic kidney disease with stage 1 through stage 4 chronic kidney disease, or unspecified chronic kidney disease: Secondary | ICD-10-CM | POA: Diagnosis not present

## 2020-06-13 DIAGNOSIS — E063 Autoimmune thyroiditis: Secondary | ICD-10-CM | POA: Diagnosis not present

## 2020-06-13 DIAGNOSIS — N1832 Chronic kidney disease, stage 3b: Secondary | ICD-10-CM | POA: Diagnosis not present

## 2020-06-13 DIAGNOSIS — I129 Hypertensive chronic kidney disease with stage 1 through stage 4 chronic kidney disease, or unspecified chronic kidney disease: Secondary | ICD-10-CM | POA: Diagnosis not present

## 2020-06-13 DIAGNOSIS — E7849 Other hyperlipidemia: Secondary | ICD-10-CM | POA: Diagnosis not present

## 2020-07-13 DIAGNOSIS — I129 Hypertensive chronic kidney disease with stage 1 through stage 4 chronic kidney disease, or unspecified chronic kidney disease: Secondary | ICD-10-CM | POA: Diagnosis not present

## 2020-07-13 DIAGNOSIS — E7849 Other hyperlipidemia: Secondary | ICD-10-CM | POA: Diagnosis not present

## 2020-07-13 DIAGNOSIS — E063 Autoimmune thyroiditis: Secondary | ICD-10-CM | POA: Diagnosis not present

## 2020-07-13 DIAGNOSIS — N1832 Chronic kidney disease, stage 3b: Secondary | ICD-10-CM | POA: Diagnosis not present

## 2020-08-01 DIAGNOSIS — E039 Hypothyroidism, unspecified: Secondary | ICD-10-CM | POA: Diagnosis not present

## 2020-08-01 DIAGNOSIS — G894 Chronic pain syndrome: Secondary | ICD-10-CM | POA: Diagnosis not present

## 2020-08-08 DIAGNOSIS — M159 Polyosteoarthritis, unspecified: Secondary | ICD-10-CM | POA: Diagnosis not present

## 2020-08-08 DIAGNOSIS — G8929 Other chronic pain: Secondary | ICD-10-CM | POA: Diagnosis not present

## 2020-08-14 DIAGNOSIS — N1832 Chronic kidney disease, stage 3b: Secondary | ICD-10-CM | POA: Diagnosis not present

## 2020-08-14 DIAGNOSIS — I129 Hypertensive chronic kidney disease with stage 1 through stage 4 chronic kidney disease, or unspecified chronic kidney disease: Secondary | ICD-10-CM | POA: Diagnosis not present

## 2020-08-14 DIAGNOSIS — E063 Autoimmune thyroiditis: Secondary | ICD-10-CM | POA: Diagnosis not present

## 2020-08-14 DIAGNOSIS — E7849 Other hyperlipidemia: Secondary | ICD-10-CM | POA: Diagnosis not present

## 2020-09-10 DIAGNOSIS — G894 Chronic pain syndrome: Secondary | ICD-10-CM | POA: Diagnosis not present

## 2020-09-13 DIAGNOSIS — E063 Autoimmune thyroiditis: Secondary | ICD-10-CM | POA: Diagnosis not present

## 2020-09-13 DIAGNOSIS — E7849 Other hyperlipidemia: Secondary | ICD-10-CM | POA: Diagnosis not present

## 2020-09-13 DIAGNOSIS — I129 Hypertensive chronic kidney disease with stage 1 through stage 4 chronic kidney disease, or unspecified chronic kidney disease: Secondary | ICD-10-CM | POA: Diagnosis not present

## 2020-09-13 DIAGNOSIS — N1832 Chronic kidney disease, stage 3b: Secondary | ICD-10-CM | POA: Diagnosis not present

## 2020-10-13 DIAGNOSIS — I129 Hypertensive chronic kidney disease with stage 1 through stage 4 chronic kidney disease, or unspecified chronic kidney disease: Secondary | ICD-10-CM | POA: Diagnosis not present

## 2020-10-13 DIAGNOSIS — E7849 Other hyperlipidemia: Secondary | ICD-10-CM | POA: Diagnosis not present

## 2020-10-13 DIAGNOSIS — N1832 Chronic kidney disease, stage 3b: Secondary | ICD-10-CM | POA: Diagnosis not present

## 2020-11-13 DIAGNOSIS — N1832 Chronic kidney disease, stage 3b: Secondary | ICD-10-CM | POA: Diagnosis not present

## 2020-11-13 DIAGNOSIS — E7849 Other hyperlipidemia: Secondary | ICD-10-CM | POA: Diagnosis not present

## 2020-11-13 DIAGNOSIS — E063 Autoimmune thyroiditis: Secondary | ICD-10-CM | POA: Diagnosis not present

## 2020-11-13 DIAGNOSIS — I129 Hypertensive chronic kidney disease with stage 1 through stage 4 chronic kidney disease, or unspecified chronic kidney disease: Secondary | ICD-10-CM | POA: Diagnosis not present

## 2020-11-30 DIAGNOSIS — G894 Chronic pain syndrome: Secondary | ICD-10-CM | POA: Diagnosis not present

## 2020-12-14 DIAGNOSIS — E7849 Other hyperlipidemia: Secondary | ICD-10-CM | POA: Diagnosis not present

## 2020-12-14 DIAGNOSIS — I129 Hypertensive chronic kidney disease with stage 1 through stage 4 chronic kidney disease, or unspecified chronic kidney disease: Secondary | ICD-10-CM | POA: Diagnosis not present

## 2020-12-14 DIAGNOSIS — N1832 Chronic kidney disease, stage 3b: Secondary | ICD-10-CM | POA: Diagnosis not present

## 2020-12-14 DIAGNOSIS — E063 Autoimmune thyroiditis: Secondary | ICD-10-CM | POA: Diagnosis not present

## 2021-01-12 DIAGNOSIS — E7849 Other hyperlipidemia: Secondary | ICD-10-CM | POA: Diagnosis not present

## 2021-01-12 DIAGNOSIS — N1832 Chronic kidney disease, stage 3b: Secondary | ICD-10-CM | POA: Diagnosis not present

## 2021-01-12 DIAGNOSIS — I129 Hypertensive chronic kidney disease with stage 1 through stage 4 chronic kidney disease, or unspecified chronic kidney disease: Secondary | ICD-10-CM | POA: Diagnosis not present

## 2021-01-12 DIAGNOSIS — E063 Autoimmune thyroiditis: Secondary | ICD-10-CM | POA: Diagnosis not present

## 2021-01-30 DIAGNOSIS — Z0000 Encounter for general adult medical examination without abnormal findings: Secondary | ICD-10-CM | POA: Diagnosis not present

## 2021-01-30 DIAGNOSIS — M810 Age-related osteoporosis without current pathological fracture: Secondary | ICD-10-CM | POA: Diagnosis not present

## 2021-01-30 DIAGNOSIS — Z1389 Encounter for screening for other disorder: Secondary | ICD-10-CM | POA: Diagnosis not present

## 2021-01-30 DIAGNOSIS — I10 Essential (primary) hypertension: Secondary | ICD-10-CM | POA: Diagnosis not present

## 2021-01-30 DIAGNOSIS — G8929 Other chronic pain: Secondary | ICD-10-CM | POA: Diagnosis not present

## 2021-01-30 DIAGNOSIS — E7849 Other hyperlipidemia: Secondary | ICD-10-CM | POA: Diagnosis not present

## 2021-01-30 DIAGNOSIS — E039 Hypothyroidism, unspecified: Secondary | ICD-10-CM | POA: Diagnosis not present

## 2021-01-30 DIAGNOSIS — Z0001 Encounter for general adult medical examination with abnormal findings: Secondary | ICD-10-CM | POA: Diagnosis not present

## 2021-02-11 DIAGNOSIS — E7849 Other hyperlipidemia: Secondary | ICD-10-CM | POA: Diagnosis not present

## 2021-02-11 DIAGNOSIS — E063 Autoimmune thyroiditis: Secondary | ICD-10-CM | POA: Diagnosis not present

## 2021-02-11 DIAGNOSIS — N1832 Chronic kidney disease, stage 3b: Secondary | ICD-10-CM | POA: Diagnosis not present

## 2021-02-11 DIAGNOSIS — I129 Hypertensive chronic kidney disease with stage 1 through stage 4 chronic kidney disease, or unspecified chronic kidney disease: Secondary | ICD-10-CM | POA: Diagnosis not present

## 2021-03-11 DIAGNOSIS — G894 Chronic pain syndrome: Secondary | ICD-10-CM | POA: Diagnosis not present

## 2021-03-13 DIAGNOSIS — E7849 Other hyperlipidemia: Secondary | ICD-10-CM | POA: Diagnosis not present

## 2021-03-13 DIAGNOSIS — N1832 Chronic kidney disease, stage 3b: Secondary | ICD-10-CM | POA: Diagnosis not present

## 2021-03-13 DIAGNOSIS — E063 Autoimmune thyroiditis: Secondary | ICD-10-CM | POA: Diagnosis not present

## 2021-03-13 DIAGNOSIS — I129 Hypertensive chronic kidney disease with stage 1 through stage 4 chronic kidney disease, or unspecified chronic kidney disease: Secondary | ICD-10-CM | POA: Diagnosis not present

## 2021-04-13 DIAGNOSIS — E7849 Other hyperlipidemia: Secondary | ICD-10-CM | POA: Diagnosis not present

## 2021-04-13 DIAGNOSIS — I129 Hypertensive chronic kidney disease with stage 1 through stage 4 chronic kidney disease, or unspecified chronic kidney disease: Secondary | ICD-10-CM | POA: Diagnosis not present

## 2021-04-13 DIAGNOSIS — N1832 Chronic kidney disease, stage 3b: Secondary | ICD-10-CM | POA: Diagnosis not present

## 2021-04-13 DIAGNOSIS — E063 Autoimmune thyroiditis: Secondary | ICD-10-CM | POA: Diagnosis not present

## 2021-05-14 DIAGNOSIS — I129 Hypertensive chronic kidney disease with stage 1 through stage 4 chronic kidney disease, or unspecified chronic kidney disease: Secondary | ICD-10-CM | POA: Diagnosis not present

## 2021-05-14 DIAGNOSIS — E7849 Other hyperlipidemia: Secondary | ICD-10-CM | POA: Diagnosis not present

## 2021-05-14 DIAGNOSIS — N1832 Chronic kidney disease, stage 3b: Secondary | ICD-10-CM | POA: Diagnosis not present

## 2021-05-14 DIAGNOSIS — E063 Autoimmune thyroiditis: Secondary | ICD-10-CM | POA: Diagnosis not present

## 2021-06-07 DIAGNOSIS — G894 Chronic pain syndrome: Secondary | ICD-10-CM | POA: Diagnosis not present

## 2021-06-13 DIAGNOSIS — N1832 Chronic kidney disease, stage 3b: Secondary | ICD-10-CM | POA: Diagnosis not present

## 2021-06-13 DIAGNOSIS — I129 Hypertensive chronic kidney disease with stage 1 through stage 4 chronic kidney disease, or unspecified chronic kidney disease: Secondary | ICD-10-CM | POA: Diagnosis not present

## 2021-06-13 DIAGNOSIS — E782 Mixed hyperlipidemia: Secondary | ICD-10-CM | POA: Diagnosis not present

## 2021-06-13 DIAGNOSIS — E063 Autoimmune thyroiditis: Secondary | ICD-10-CM | POA: Diagnosis not present

## 2021-07-14 DIAGNOSIS — E782 Mixed hyperlipidemia: Secondary | ICD-10-CM | POA: Diagnosis not present

## 2021-07-14 DIAGNOSIS — N1832 Chronic kidney disease, stage 3b: Secondary | ICD-10-CM | POA: Diagnosis not present

## 2021-07-14 DIAGNOSIS — I129 Hypertensive chronic kidney disease with stage 1 through stage 4 chronic kidney disease, or unspecified chronic kidney disease: Secondary | ICD-10-CM | POA: Diagnosis not present

## 2021-07-14 DIAGNOSIS — E063 Autoimmune thyroiditis: Secondary | ICD-10-CM | POA: Diagnosis not present

## 2021-08-14 DIAGNOSIS — E063 Autoimmune thyroiditis: Secondary | ICD-10-CM | POA: Diagnosis not present

## 2021-08-14 DIAGNOSIS — E7849 Other hyperlipidemia: Secondary | ICD-10-CM | POA: Diagnosis not present

## 2021-08-14 DIAGNOSIS — I129 Hypertensive chronic kidney disease with stage 1 through stage 4 chronic kidney disease, or unspecified chronic kidney disease: Secondary | ICD-10-CM | POA: Diagnosis not present

## 2021-08-14 DIAGNOSIS — N1832 Chronic kidney disease, stage 3b: Secondary | ICD-10-CM | POA: Diagnosis not present

## 2021-09-02 DIAGNOSIS — G8929 Other chronic pain: Secondary | ICD-10-CM | POA: Diagnosis not present

## 2021-09-13 DIAGNOSIS — E063 Autoimmune thyroiditis: Secondary | ICD-10-CM | POA: Diagnosis not present

## 2021-09-13 DIAGNOSIS — E782 Mixed hyperlipidemia: Secondary | ICD-10-CM | POA: Diagnosis not present

## 2021-09-13 DIAGNOSIS — N1832 Chronic kidney disease, stage 3b: Secondary | ICD-10-CM | POA: Diagnosis not present

## 2021-09-13 DIAGNOSIS — I129 Hypertensive chronic kidney disease with stage 1 through stage 4 chronic kidney disease, or unspecified chronic kidney disease: Secondary | ICD-10-CM | POA: Diagnosis not present

## 2021-09-27 DIAGNOSIS — K0889 Other specified disorders of teeth and supporting structures: Secondary | ICD-10-CM | POA: Diagnosis not present

## 2021-10-14 DIAGNOSIS — E782 Mixed hyperlipidemia: Secondary | ICD-10-CM | POA: Diagnosis not present

## 2021-10-14 DIAGNOSIS — N189 Chronic kidney disease, unspecified: Secondary | ICD-10-CM | POA: Diagnosis not present

## 2021-10-14 DIAGNOSIS — I129 Hypertensive chronic kidney disease with stage 1 through stage 4 chronic kidney disease, or unspecified chronic kidney disease: Secondary | ICD-10-CM | POA: Diagnosis not present

## 2021-12-26 DIAGNOSIS — Z0000 Encounter for general adult medical examination without abnormal findings: Secondary | ICD-10-CM | POA: Diagnosis not present

## 2021-12-26 DIAGNOSIS — Z6822 Body mass index (BMI) 22.0-22.9, adult: Secondary | ICD-10-CM | POA: Diagnosis not present

## 2021-12-26 DIAGNOSIS — Z23 Encounter for immunization: Secondary | ICD-10-CM | POA: Diagnosis not present

## 2021-12-26 DIAGNOSIS — N1832 Chronic kidney disease, stage 3b: Secondary | ICD-10-CM | POA: Diagnosis not present

## 2021-12-26 DIAGNOSIS — N189 Chronic kidney disease, unspecified: Secondary | ICD-10-CM | POA: Diagnosis not present

## 2021-12-26 DIAGNOSIS — E039 Hypothyroidism, unspecified: Secondary | ICD-10-CM | POA: Diagnosis not present

## 2021-12-26 DIAGNOSIS — K0889 Other specified disorders of teeth and supporting structures: Secondary | ICD-10-CM | POA: Diagnosis not present

## 2021-12-26 DIAGNOSIS — E782 Mixed hyperlipidemia: Secondary | ICD-10-CM | POA: Diagnosis not present

## 2021-12-26 DIAGNOSIS — I129 Hypertensive chronic kidney disease with stage 1 through stage 4 chronic kidney disease, or unspecified chronic kidney disease: Secondary | ICD-10-CM | POA: Diagnosis not present

## 2022-01-09 ENCOUNTER — Other Ambulatory Visit: Payer: Self-pay

## 2022-01-09 ENCOUNTER — Encounter (HOSPITAL_COMMUNITY): Payer: Self-pay

## 2022-01-09 ENCOUNTER — Inpatient Hospital Stay (HOSPITAL_COMMUNITY)
Admission: EM | Admit: 2022-01-09 | Discharge: 2022-01-13 | DRG: 689 | Disposition: A | Discharge status: Home / Self Care | Payer: Medicare Other | Attending: Internal Medicine | Admitting: Internal Medicine

## 2022-01-09 ENCOUNTER — Emergency Department (HOSPITAL_COMMUNITY): Payer: Medicare Other

## 2022-01-09 DIAGNOSIS — Z9049 Acquired absence of other specified parts of digestive tract: Secondary | ICD-10-CM | POA: Diagnosis not present

## 2022-01-09 DIAGNOSIS — F03A0 Unspecified dementia, mild, without behavioral disturbance, psychotic disturbance, mood disturbance, and anxiety: Secondary | ICD-10-CM | POA: Diagnosis present

## 2022-01-09 DIAGNOSIS — N390 Urinary tract infection, site not specified: Secondary | ICD-10-CM | POA: Diagnosis present

## 2022-01-09 DIAGNOSIS — Z87891 Personal history of nicotine dependence: Secondary | ICD-10-CM | POA: Diagnosis not present

## 2022-01-09 DIAGNOSIS — I10 Essential (primary) hypertension: Secondary | ICD-10-CM | POA: Diagnosis not present

## 2022-01-09 DIAGNOSIS — Y92009 Unspecified place in unspecified non-institutional (private) residence as the place of occurrence of the external cause: Secondary | ICD-10-CM

## 2022-01-09 DIAGNOSIS — E871 Hypo-osmolality and hyponatremia: Secondary | ICD-10-CM | POA: Diagnosis not present

## 2022-01-09 DIAGNOSIS — L89211 Pressure ulcer of right hip, stage 1: Secondary | ICD-10-CM | POA: Diagnosis present

## 2022-01-09 DIAGNOSIS — G9340 Encephalopathy, unspecified: Secondary | ICD-10-CM | POA: Diagnosis not present

## 2022-01-09 DIAGNOSIS — N3000 Acute cystitis without hematuria: Secondary | ICD-10-CM | POA: Diagnosis not present

## 2022-01-09 DIAGNOSIS — E785 Hyperlipidemia, unspecified: Secondary | ICD-10-CM | POA: Diagnosis present

## 2022-01-09 DIAGNOSIS — J984 Other disorders of lung: Secondary | ICD-10-CM | POA: Diagnosis not present

## 2022-01-09 DIAGNOSIS — Z043 Encounter for examination and observation following other accident: Secondary | ICD-10-CM | POA: Diagnosis not present

## 2022-01-09 DIAGNOSIS — Z96641 Presence of right artificial hip joint: Secondary | ICD-10-CM | POA: Diagnosis not present

## 2022-01-09 DIAGNOSIS — Z885 Allergy status to narcotic agent status: Secondary | ICD-10-CM | POA: Diagnosis not present

## 2022-01-09 DIAGNOSIS — M25562 Pain in left knee: Secondary | ICD-10-CM | POA: Diagnosis not present

## 2022-01-09 DIAGNOSIS — Z20822 Contact with and (suspected) exposure to covid-19: Secondary | ICD-10-CM | POA: Diagnosis not present

## 2022-01-09 DIAGNOSIS — G319 Degenerative disease of nervous system, unspecified: Secondary | ICD-10-CM | POA: Diagnosis not present

## 2022-01-09 DIAGNOSIS — S6992XA Unspecified injury of left wrist, hand and finger(s), initial encounter: Secondary | ICD-10-CM

## 2022-01-09 DIAGNOSIS — S60212A Contusion of left wrist, initial encounter: Secondary | ICD-10-CM | POA: Diagnosis present

## 2022-01-09 DIAGNOSIS — Z79890 Hormone replacement therapy (postmenopausal): Secondary | ICD-10-CM

## 2022-01-09 DIAGNOSIS — B961 Klebsiella pneumoniae [K. pneumoniae] as the cause of diseases classified elsewhere: Secondary | ICD-10-CM | POA: Diagnosis present

## 2022-01-09 DIAGNOSIS — I452 Bifascicular block: Secondary | ICD-10-CM | POA: Diagnosis not present

## 2022-01-09 DIAGNOSIS — R296 Repeated falls: Secondary | ICD-10-CM | POA: Diagnosis not present

## 2022-01-09 DIAGNOSIS — Z9842 Cataract extraction status, left eye: Secondary | ICD-10-CM | POA: Diagnosis not present

## 2022-01-09 DIAGNOSIS — E876 Hypokalemia: Secondary | ICD-10-CM | POA: Diagnosis not present

## 2022-01-09 DIAGNOSIS — Z9841 Cataract extraction status, right eye: Secondary | ICD-10-CM | POA: Diagnosis not present

## 2022-01-09 DIAGNOSIS — R6889 Other general symptoms and signs: Secondary | ICD-10-CM | POA: Diagnosis not present

## 2022-01-09 DIAGNOSIS — Z66 Do not resuscitate: Secondary | ICD-10-CM | POA: Diagnosis present

## 2022-01-09 DIAGNOSIS — I517 Cardiomegaly: Secondary | ICD-10-CM | POA: Diagnosis not present

## 2022-01-09 DIAGNOSIS — E039 Hypothyroidism, unspecified: Secondary | ICD-10-CM | POA: Diagnosis not present

## 2022-01-09 DIAGNOSIS — Z85038 Personal history of other malignant neoplasm of large intestine: Secondary | ICD-10-CM | POA: Diagnosis not present

## 2022-01-09 DIAGNOSIS — W19XXXA Unspecified fall, initial encounter: Secondary | ICD-10-CM

## 2022-01-09 DIAGNOSIS — L8990 Pressure ulcer of unspecified site, unspecified stage: Secondary | ICD-10-CM | POA: Insufficient documentation

## 2022-01-09 DIAGNOSIS — G9341 Metabolic encephalopathy: Secondary | ICD-10-CM | POA: Diagnosis present

## 2022-01-09 DIAGNOSIS — R404 Transient alteration of awareness: Secondary | ICD-10-CM | POA: Diagnosis not present

## 2022-01-09 DIAGNOSIS — Z79899 Other long term (current) drug therapy: Secondary | ICD-10-CM | POA: Diagnosis not present

## 2022-01-09 DIAGNOSIS — S0990XA Unspecified injury of head, initial encounter: Secondary | ICD-10-CM | POA: Diagnosis not present

## 2022-01-09 DIAGNOSIS — E878 Other disorders of electrolyte and fluid balance, not elsewhere classified: Secondary | ICD-10-CM | POA: Diagnosis present

## 2022-01-09 DIAGNOSIS — Z743 Need for continuous supervision: Secondary | ICD-10-CM | POA: Diagnosis not present

## 2022-01-09 DIAGNOSIS — Z7982 Long term (current) use of aspirin: Secondary | ICD-10-CM

## 2022-01-09 DIAGNOSIS — Z833 Family history of diabetes mellitus: Secondary | ICD-10-CM

## 2022-01-09 DIAGNOSIS — M7989 Other specified soft tissue disorders: Secondary | ICD-10-CM | POA: Diagnosis not present

## 2022-01-09 DIAGNOSIS — F419 Anxiety disorder, unspecified: Secondary | ICD-10-CM | POA: Diagnosis present

## 2022-01-09 DIAGNOSIS — I6529 Occlusion and stenosis of unspecified carotid artery: Secondary | ICD-10-CM | POA: Diagnosis not present

## 2022-01-09 HISTORY — DX: Unspecified dementia, unspecified severity, without behavioral disturbance, psychotic disturbance, mood disturbance, and anxiety: F03.90

## 2022-01-09 LAB — URINALYSIS, ROUTINE W REFLEX MICROSCOPIC
Bilirubin Urine: NEGATIVE
Glucose, UA: NEGATIVE mg/dL
Ketones, ur: NEGATIVE mg/dL
Nitrite: NEGATIVE
Protein, ur: 100 mg/dL — AB
Specific Gravity, Urine: 1.024 (ref 1.005–1.030)
WBC, UA: >50 WBC/hpf — ABNORMAL HIGH (ref 0–5)
pH: 6 (ref 5.0–8.0)

## 2022-01-09 LAB — CBC WITH DIFFERENTIAL/PLATELET
Abs Immature Granulocytes: 0.22 10*3/uL — ABNORMAL HIGH (ref 0.00–0.07)
Basophils Absolute: 0 10*3/uL (ref 0.0–0.1)
Basophils Relative: 0 %
Eosinophils Absolute: 0 10*3/uL (ref 0.0–0.5)
Eosinophils Relative: 0 %
HCT: 36.6 % (ref 36.0–46.0)
Hemoglobin: 11.8 g/dL — ABNORMAL LOW (ref 12.0–15.0)
Immature Granulocytes: 2 %
Lymphocytes Relative: 5 %
Lymphs Abs: 0.6 10*3/uL — ABNORMAL LOW (ref 0.7–4.0)
MCH: 29.1 pg (ref 26.0–34.0)
MCHC: 32.2 g/dL (ref 30.0–36.0)
MCV: 90.1 fL (ref 80.0–100.0)
Monocytes Absolute: 1 10*3/uL (ref 0.1–1.0)
Monocytes Relative: 9 %
Neutro Abs: 9.6 10*3/uL — ABNORMAL HIGH (ref 1.7–7.7)
Neutrophils Relative %: 84 %
Platelets: 218 10*3/uL (ref 150–400)
RBC: 4.06 MIL/uL (ref 3.87–5.11)
RDW: 12.6 % (ref 11.5–15.5)
WBC: 11.4 10*3/uL — ABNORMAL HIGH (ref 4.0–10.5)
nRBC: 0 % (ref 0.0–0.2)

## 2022-01-09 LAB — RESP PANEL BY RT-PCR (FLU A&B, COVID) ARPGX2
Influenza A by PCR: NEGATIVE
Influenza B by PCR: NEGATIVE
SARS Coronavirus 2 by RT PCR: NEGATIVE

## 2022-01-09 LAB — COMPREHENSIVE METABOLIC PANEL
ALT: 72 U/L — ABNORMAL HIGH (ref 0–44)
AST: 91 U/L — ABNORMAL HIGH (ref 15–41)
Albumin: 2.6 g/dL — ABNORMAL LOW (ref 3.5–5.0)
Alkaline Phosphatase: 76 U/L (ref 38–126)
Anion gap: 7 (ref 5–15)
BUN: 24 mg/dL — ABNORMAL HIGH (ref 8–23)
CO2: 30 mmol/L (ref 22–32)
Calcium: 8.4 mg/dL — ABNORMAL LOW (ref 8.9–10.3)
Chloride: 89 mmol/L — ABNORMAL LOW (ref 98–111)
Creatinine, Ser: 0.9 mg/dL (ref 0.44–1.00)
GFR, Estimated: >60 mL/min (ref >60)
Glucose, Bld: 142 mg/dL — ABNORMAL HIGH (ref 70–99)
Potassium: 2.6 mmol/L — CL (ref 3.5–5.1)
Sodium: 126 mmol/L — ABNORMAL LOW (ref 135–145)
Total Bilirubin: 1.1 mg/dL (ref 0.3–1.2)
Total Protein: 6.1 g/dL — ABNORMAL LOW (ref 6.5–8.1)

## 2022-01-09 LAB — TSH: TSH: 4.848 u[IU]/mL — ABNORMAL HIGH (ref 0.350–4.500)

## 2022-01-09 LAB — MAGNESIUM: Magnesium: 1.9 mg/dL (ref 1.7–2.4)

## 2022-01-09 MED ORDER — SODIUM CHLORIDE 0.9 % IV SOLN
1.0000 g | Freq: Once | INTRAVENOUS | Status: AC
  Administered 2022-01-09: 1 g via INTRAVENOUS
  Filled 2022-01-09: qty 10

## 2022-01-09 MED ORDER — POTASSIUM CHLORIDE IN NACL 40-0.9 MEQ/L-% IV SOLN: INTRAVENOUS | Status: AC

## 2022-01-09 MED ORDER — ENOXAPARIN SODIUM 40 MG/0.4ML IJ SOSY
40.0000 mg | PREFILLED_SYRINGE | INTRAMUSCULAR | Status: DC
  Administered 2022-01-10 – 2022-01-13 (×4): 40 mg via SUBCUTANEOUS
  Filled 2022-01-09 (×4): qty 0.4

## 2022-01-09 MED ORDER — ACETAMINOPHEN 650 MG RE SUPP: 650.0000 mg | Freq: Four times a day (QID) | RECTAL | Status: DC | PRN

## 2022-01-09 MED ORDER — ONDANSETRON HCL 4 MG PO TABS: 4.0000 mg | ORAL_TABLET | Freq: Four times a day (QID) | ORAL | Status: DC | PRN

## 2022-01-09 MED ORDER — DONEPEZIL HCL 5 MG PO TABS
5.0000 mg | ORAL_TABLET | Freq: Every day | ORAL | Status: DC
  Administered 2022-01-09 – 2022-01-12 (×4): 5 mg via ORAL
  Filled 2022-01-09 (×4): qty 1

## 2022-01-09 MED ORDER — SODIUM CHLORIDE 0.9 % IV SOLN
1.0000 g | INTRAVENOUS | Status: DC
  Administered 2022-01-10 – 2022-01-11 (×2): 1 g via INTRAVENOUS
  Filled 2022-01-09 (×2): qty 10

## 2022-01-09 MED ORDER — LEVOTHYROXINE SODIUM 50 MCG PO TABS
50.0000 ug | ORAL_TABLET | Freq: Every day | ORAL | Status: DC
  Administered 2022-01-10 – 2022-01-13 (×4): 50 ug via ORAL
  Filled 2022-01-09 (×4): qty 1

## 2022-01-09 MED ORDER — POTASSIUM CHLORIDE 10 MEQ/100ML IV SOLN
10.0000 meq | INTRAVENOUS | Status: AC
  Administered 2022-01-09 (×2): 10 meq via INTRAVENOUS
  Filled 2022-01-09 (×2): qty 100

## 2022-01-09 MED ORDER — SERTRALINE HCL 50 MG PO TABS
25.0000 mg | ORAL_TABLET | Freq: Every morning | ORAL | Status: DC
  Administered 2022-01-10 – 2022-01-13 (×4): 25 mg via ORAL
  Filled 2022-01-09 (×4): qty 1

## 2022-01-09 MED ORDER — POTASSIUM CHLORIDE CRYS ER 20 MEQ PO TBCR
40.0000 meq | EXTENDED_RELEASE_TABLET | Freq: Once | ORAL | Status: AC
  Administered 2022-01-09: 40 meq via ORAL
  Filled 2022-01-09: qty 2

## 2022-01-09 MED ORDER — ASPIRIN EC 325 MG PO TBEC
325.0000 mg | DELAYED_RELEASE_TABLET | Freq: Every day | ORAL | Status: DC
  Administered 2022-01-10 – 2022-01-13 (×4): 325 mg via ORAL
  Filled 2022-01-09 (×4): qty 1

## 2022-01-09 MED ORDER — LISINOPRIL 5 MG PO TABS
5.0000 mg | ORAL_TABLET | Freq: Every day | ORAL | Status: DC
  Administered 2022-01-10 – 2022-01-12 (×3): 5 mg via ORAL
  Filled 2022-01-09 (×5): qty 1

## 2022-01-09 MED ORDER — POLYETHYLENE GLYCOL 3350 17 G PO PACK: 17.0000 g | PACK | Freq: Every day | ORAL | Status: DC | PRN

## 2022-01-09 MED ORDER — ONDANSETRON HCL 4 MG/2ML IJ SOLN: 4.0000 mg | Freq: Four times a day (QID) | INTRAMUSCULAR | Status: DC | PRN

## 2022-01-09 MED ORDER — ACETAMINOPHEN 325 MG PO TABS
650.0000 mg | ORAL_TABLET | Freq: Four times a day (QID) | ORAL | Status: DC | PRN
  Administered 2022-01-11: 650 mg via ORAL
  Filled 2022-01-09: qty 2

## 2022-01-09 NOTE — ED Notes (Signed)
Report attempted x 1

## 2022-01-09 NOTE — ED Provider Notes (Signed)
[facility] Provider Note   CSN: [id number] Arrival date & time: 01/09/22  1433     History  Chief Complaint  Patient presents with   Altered Mental Status    Carol Rosario is a 81 y.o. female.   Altered Mental Status Patient brought in for mental status changes.  Reportedly has a baseline dementia but is been more confused.  He has had falls twice this week.  Reportedly pain in left wrist and left side.  Patient is confused and states she is not hurting anywhere.  Patient misidentifies her niece as her daughter-in-law.  Reportedly more confused at home.  Lives there with her son.  House reportedly smells like urine. unknown if patient hit head.   Past Medical History:  Diagnosis Date   Anxiety    Bifascicular block    Cancer ([place]) 08/2012   colon cancer   Degeneration of lumbar or lumbosacral intervertebral disc    Dementia (HCC)    DJD (degenerative joint disease)    Family history of tobacco abuse and dependence    History of recurrent UTIs    Hyperlipidemia    Hypertension    Hypothyroidism    Obesity    Palpitations    PONV (postoperative nausea and vomiting)    Sciatica    Syncope 2008    Home Medications Prior to Admission medications   Medication Sig Start Date End Date Taking? Authorizing Provider  ALPRAZolam Duanne Moron) 0.5 MG tablet Take 0.5 mg by mouth at bedtime as needed. Sleep 06/21/12   [provider]  aspirin EC 325 MG EC tablet Take 1 tablet (325 mg total) by mouth 2 (two) times daily. Patient taking differently: Take 325 mg by mouth daily.  05/05/13   Carole Civil, MD  atorvastatin (LIPITOR) 80 MG tablet TAKE 1 TABLET BY MOUTH ONCE A DAY. 04/26/19   Herminio Commons, MD  azithromycin (ZITHROMAX) 250 MG tablet Take 1 tablet (250 mg total) by mouth daily. Take first 2 tablets together, then 1 every day until finished. 10/01/19   Fredia Sorrow, MD  cefpodoxime (VANTIN) 200 MG tablet Take 1 tablet (200 mg total) by  mouth 2 (two) times daily. 03/19/17   Thurnell Lose, MD  fish oil-omega-3 fatty acids 1000 MG capsule Take 2 g by mouth daily.    [provider]  gabapentin (NEURONTIN) 100 MG capsule Take 1 capsule (100 mg total) by mouth 3 (three) times daily. Patient taking differently: Take 200 mg by mouth 3 (three) times daily.  03/15/13   Carole Civil, MD  HYDROcodone-acetaminophen (NORCO/VICODIN) 5-325 MG tablet Take 1-2 tablets by mouth every 6 (six) hours as needed. 10/01/19   Fredia Sorrow, MD  levothyroxine (SYNTHROID, LEVOTHROID) 50 MCG tablet Take 50 mcg by mouth daily.    [provider]  lisinopril-hydrochlorothiazide (PRINZIDE,ZESTORETIC) 20-12.5 MG tablet Take 1 tablet by mouth daily. 03/21/17   Thurnell Lose, MD  methocarbamol (ROBAXIN) 500 MG tablet Take 1 tablet (500 mg total) by mouth every 6 (six) hours as needed. 11/03/13   Carole Civil, MD      Allergies    Codeine    Review of Systems   Review of Systems  Unable to perform ROS: Mental status change   Physical Exam Updated Vital Signs BP (!) 160/81    Pulse 69    Temp 98.9 F (37.2 C)    Resp 19    Ht 5\' 5"  (1.651 m)    Wt  59 kg    SpO2 93%    BMI 21.64 kg/m  Physical Exam Vitals reviewed.  HENT:     Head: Atraumatic.  Eyes:     General: No scleral icterus. Cardiovascular:     Rate and Rhythm: Regular rhythm.  Pulmonary:     Breath sounds: No wheezing or rhonchi.  Abdominal:     Tenderness: There is abdominal tenderness.     Comments: Mild tenderness with fullness over lower abdomen.  Question bladder distention.  Musculoskeletal:        General: Tenderness present.     Cervical back: Neck supple.     Comments: Tenderness to left shoulder.  Decreased range of motion.  No tenderness over elbow.  Tenderness with swelling over left wrist with deformity.  Tenderness over the left knee.  Appears to be good range of motion in hips.  No thoracic or lumbar tenderness.  No cervical spine  tenderness.  No right lower or right upper extremity tenderness.  Neurological:     Mental Status: She is alert.     Comments: Sitting in bed.  Bed angled back.  Some confusion.  More confused than baseline.  Moving all extremities    ED Results / Procedures / Treatments   Labs (all labs ordered are listed, but only abnormal results are displayed) Labs Reviewed  COMPREHENSIVE METABOLIC PANEL - Abnormal; Notable for the following components:      Result Value   Sodium 126 (*)    Potassium 2.6 (*)    Chloride 89 (*)    Glucose, Bld 142 (*)    BUN 24 (*)    Calcium 8.4 (*)    Total Protein 6.1 (*)    Albumin 2.6 (*)    AST 91 (*)    ALT 72 (*)    All other components within normal limits  URINALYSIS, ROUTINE W REFLEX MICROSCOPIC - Abnormal; Notable for the following components:   Color, Urine AMBER (*)    APPearance HAZY (*)    Hgb urine dipstick MODERATE (*)    Protein, ur 100 (*)    Leukocytes,Ua SMALL (*)    WBC, UA >50 (*)    Bacteria, UA MANY (*)    All other components within normal limits  CBC WITH DIFFERENTIAL/PLATELET - Abnormal; Notable for the following components:   WBC 11.4 (*)    Hemoglobin 11.8 (*)    Neutro Abs 9.6 (*)    Lymphs Abs 0.6 (*)    Abs Immature Granulocytes 0.22 (*)    All other components within normal limits  TSH - Abnormal; Notable for the following components:   TSH 4.848 (*)    All other components within normal limits  RESP PANEL BY RT-PCR (FLU A&B, COVID) [id number]  URINE CULTURE    EKG EKG Interpretation  Date/Time:  Thursday January 09 2022 15:24:44 EST Ventricular Rate:  78 PR Interval:  167 QRS Duration: 151 QT Interval:  422 QTC Calculation: 481 R Axis:   -70 Text Interpretation: Sinus rhythm 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  L2 mental status. EXAM: CHEST - 2 VIEW COMPARISON:  Sternum radiographs 12/19/2014 and chest radiographs 09/09/2012 FINDINGS: The cardiac silhouette is mildly enlarged. No airspace consolidation, edema, pleural effusion, or pneumothorax is identified. Minimal scarring is noted in the right mid  lung. No acute osseous abnormality is identified. IMPRESSION: No active cardiopulmonary disease. Electronically Signed   By: Logan Bores M.D.   On: 01/09/2022 16:53   DG Wrist Complete Left  Result Date: 01/09/2022 CLINICAL DATA:  Fall EXAM: LEFT WRIST - COMPLETE 4 VIEW COMPARISON:  None. FINDINGS: Plate and screw fixation of the distal radius. Old ulnar styloid process fracture. No evidence of acute fracture or dislocation. Widening of the scapholunate interval. Moderate degenerative changes of the radiocarpal joint. Severe degenerative changes of the first [facility] joint. Soft tissue swelling of the wrist. IMPRESSION: 1. Widening of the scapholunate interval which can be seen in the setting of scapholunate ligament injury. 2. No acute osseous abnormality. Electronically Signed   By: Yetta Glassman M.D.   On: 01/09/2022 16:57   CT Head Wo Contrast  Result Date: 01/09/2022 CLINICAL DATA:  Fall, head trauma EXAM: CT HEAD WITHOUT CONTRAST TECHNIQUE: Contiguous axial images were obtained from the base of the skull through the vertex without intravenous contrast. RADIATION DOSE REDUCTION: This exam was performed according to the departmental dose-optimization program which includes automated exposure control, adjustment of the mA and/or kV according to patient size and/or use of iterative reconstruction technique. COMPARISON:  CT brain 03/17/2017 FINDINGS: Brain: No acute territorial infarction, hemorrhage or intracranial mass. Atrophy and chronic small vessel ischemic changes of the white matter. Stable ventricle size. Vascular: No hyperdense vessels. Carotid and vertebral vascular calcification Skull: Normal.  Negative for fracture or focal lesion. Sinuses/Orbits: No acute finding. Other: None IMPRESSION: 1. No CT evidence for acute intracranial abnormality. Atrophy and chronic small vessel ischemic changes of the white matter. Electronically Signed   By: Donavan Foil M.D.   On: 01/09/2022 16:14   DG Shoulder Left  Result Date: 01/09/2022 CLINICAL DATA:  Fall. EXAM: LEFT SHOULDER - 2+ VIEW COMPARISON:  None. FINDINGS: No acute fracture or dislocation is identified. Mild-to-moderate degenerative changes are noted at the acromioclavicular joint. The soft tissues are unremarkable. IMPRESSION: No acute osseous abnormality identified. Electronically Signed   By: Logan Bores M.D.   On: 01/09/2022 16:52   DG Knee Complete 4 Views Left  Result Date: 01/09/2022 CLINICAL DATA:  Left knee pain after fall EXAM: LEFT KNEE - COMPLETE 4+ VIEW COMPARISON:  X-ray 08/20/2017 FINDINGS: Osseous structures are demineralized. No evidence of an acute fracture. 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 Small knee joint effusion, nonspecific. Prominent atherosclerotic vascular calcifications. Mild soft tissue swelling at the lateral aspect of the knee. IMPRESSION: 1. No acute fracture or dislocation. 2. 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 3. Small knee joint effusion, nonspecific. 4. Mild soft tissue swelling at the lateral aspect of the knee. Electronically Signed   By: Davina Poke D.O.   On: 01/09/2022 16:54    Procedures Procedures    Medications Ordered in ED Medications  cefTRIAXone (ROCEPHIN) 1 g in sodium chloride 0.9 % 100 mL IVPB (has no administration in time range)    ED Course/ Medical Decision Making/ A&P                           Medical Decision Making Problems Addressed: Acute cystitis without hematuria: acute illness or injury that poses a threat to life or bodily functions Fall, initial  encounter: acute illness or injury Hypokalemia: acute illness or injury Hyponatremia: acute illness or injury  Amount and/or Complexity of Data Reviewed Labs: ordered. Decision-making details documented in ED Course. Radiology: ordered  and independent interpretation performed. Decision-making details documented in ED Course.   Patient with a couple falls recently.  Also worsening confusion.  Has baseline dementia but worse now.  Head CT reassuring.  Chest x-ray reassuring.  Some arthritis in both knee and shoulder.  However possible scapholunate ligament injury on left.  There is edema and swelling with pain on this wrist was likely an acute injury.  Discussed with Dr. Amedeo Kinsman.  Immobilized with Velcro splint and follow-up in the office.  Does not necessarily need to be seen while she is in the hospital. Urine does show infection.  Culture sent.  Will treat with antibiotics.  Also hyponatremia and hypokalemia.  Reportedly has not been eating and drinking much at home.  Lives at home with a family member.  Does have an acute encephalopathy at this time but if does not have much improvement may require higher level of care.  Will discuss with hospitalist for admission        Final Clinical Impression(s) / ED Diagnoses Final diagnoses:  Encephalopathy  Acute cystitis without hematuria  Hypokalemia  Fall, initial encounter  Injury of left wrist, initial encounter  Hyponatremia    Rx / DC Orders ED Discharge Orders          Ordered    Wrist splint        01/09/22 1853              Davonna Belling, MD 01/09/22 [phone]

## 2022-01-09 NOTE — ED Triage Notes (Signed)
Pt bib by ems from home for AMS.  Reported that pt had fall yesterday.  EMS reports house smells of strong urine odor.  ems reports dementia at baseline but has been more confused than normal.  Pt is alert.  Resp even and unlabored.  Swelling noted to L wrist after fall yesterday.  Unknown if there was head injury. Pt is alert to self only.

## 2022-01-09 NOTE — H&P (Signed)
History and Physical    Carol Rosario UDJ:[id number] DOB: [birth year] DOA: 01/09/2022  PCP: Redmond School, MD   Patient coming from: Home  I have personally briefly reviewed patient's old medical records in [place]  Chief Complaint: Confusion  HPI: Carol Rosario is a 81 y.o. female with medical history significant for hypertension, dementia. Patient was brought to the ED via EMS reports of change in mental status.  Family also reports a strong odor to patient's urine.  I time of my evaluation, patient is confused, answers a few questions appropriately.  Patient's daughter [name] is at bedside. Patient lives with her son, but daughter [name] checks on patient almost every day.  She reports over the past 2 to 3 Days Patient Has Been Confused, not recognizing family members.  She has also been sleeping most of the day over the past 4 days, with poor oral intake.  Patient has not reported any vomiting, has not had no loose stools, no cough, no difficulty breathing..  She has fallen twice in the past few days, sustaining swelling to her left wrist.  Last fall was yesterday.  At baseline she has dementia, but this is not severe.  Ambulates with walker or cane, she is able to answer simple questions, hold simple conversations.  ED Course: Stable vitals.  WBC 11.4.  Sodium 126.  Potassium 2.6.  UA suggestive of UTI.   Imaging included left wrist x-ray, which suggested scapholunate ligament injury.  Chest x-ray, left knee x-ray, left shoulder x-ray, which were all unremarkable.  Head CT was without acute abnormality. Urine cultures ordered.  COVID, flu negative. IV ceftriaxone started.   EDP talked to Dr. Lorretta Harp immobilizing with Velcro splint and follow-up in office, does not necessarily need to be seen in the hospital. Hospitalist to admit.  Review of Systems: Unable to ascertain due to altered mental status.  Past Medical History:  Diagnosis Date   Anxiety     Bifascicular block    Cancer ([place]) 08/2012   colon cancer   Degeneration of lumbar or lumbosacral intervertebral disc    Dementia (HCC)    DJD (degenerative joint disease)    Family history of tobacco abuse and dependence    History of recurrent UTIs    Hyperlipidemia    Hypertension    Hypothyroidism    Obesity    Palpitations    PONV (postoperative nausea and vomiting)    Sciatica    Syncope 2008    Past Surgical History:  Procedure Laterality Date   bilateral tubal ligation     bladder resuspension     CATARACT EXTRACTION, BILATERAL     CHOLECYSTECTOMY     FLEXIBLE SIGMOIDOSCOPY  08/26/2012   Procedure: FLEXIBLE SIGMOIDOSCOPY;  Surgeon: Daneil Dolin, MD;  Location: [facility];  Service: Endoscopy;  Laterality: N/A;   HIP SURGERY  2011   left, [facility]   KNEE SURGERY     left arthroscopy   PARTIAL COLECTOMY  09/13/2012   Procedure: PARTIAL COLECTOMY;  Surgeon: Jamesetta So, MD;  Location: [facility];  Service: General;  Laterality: N/A;   TOTAL HIP ARTHROPLASTY Right 05/02/2013   Procedure: TOTAL HIP ARTHROPLASTY;  Surgeon: Carole Civil, MD;  Location: [facility];  Service: Orthopedics;  Laterality: Right;  Right Total Hip Arthroplasty   TUBAL LIGATION     VESICOVAGINAL FISTULA CLOSURE W/ TAH       reports that she quit smoking about 11 years ago. Her smoking use included  cigarettes. She has never used smokeless tobacco. She reports that she does not drink alcohol and does not use drugs.  Allergies  Allergen Reactions   Codeine Shortness Of Breath and Itching    Family History  Problem Relation Age of Onset   Arthritis Other    Cancer Other    Diabetes Other    Heart disease Neg Hx     Prior to Admission medications   Medication Sig Start Date End Date Taking? Authorizing Provider  aspirin EC 325 MG EC tablet Take 1 tablet (325 mg total) by mouth 2 (two) times daily. Patient taking differently: Take 325 mg by mouth daily. 05/05/13  Yes Carole Civil, MD   atorvastatin (LIPITOR) 80 MG tablet TAKE 1 TABLET BY MOUTH ONCE A DAY. 04/26/19  Yes Herminio Commons, MD  donepezil (ARICEPT) 5 MG tablet Take 5 mg by mouth at bedtime. 01/01/22  Yes [provider]  fish oil-omega-3 fatty acids 1000 MG capsule Take 2 g by mouth daily.   Yes [provider]  levothyroxine (SYNTHROID, LEVOTHROID) 50 MCG tablet Take 50 mcg by mouth daily.   Yes [provider]  lisinopril (ZESTRIL) 5 MG tablet Take 5 mg by mouth at bedtime. 01/01/22  Yes [provider]  sertraline (ZOLOFT) 25 MG tablet Take 25 mg by mouth every morning. 01/01/22  Yes [provider]  HYDROcodone-acetaminophen (NORCO/VICODIN) 5-325 MG tablet Take 1-2 tablets by mouth every 6 (six) hours as needed. 10/01/19   Fredia Sorrow, MD  ibuprofen (ADVIL) 800 MG tablet Take 800 mg by mouth every 8 (eight) hours as needed. 12/17/21   [provider]  methocarbamol (ROBAXIN) 500 MG tablet Take 1 tablet (500 mg total) by mouth every 6 (six) hours as needed. Patient not taking: Reported on 01/09/2022 11/03/13   Carole Civil, MD    Physical Exam: Vitals:   01/09/22 1745 01/09/22 1806 01/09/22 1930 01/09/22 2107  BP:  (!) 160/81 (!) 158/94 (!) 186/86  Pulse: 77 69 75 73  Resp: 19 19 16 20   Temp:   98.9 F (37.2 C) 98.3 F (36.8 C)  TempSrc:    Oral  SpO2: 91% 93% 97% 95%  Weight:    61.2 kg  Height:    5\' 5"  (1.651 m)    Constitutional: Mildly agitated, moving extremities upper extremities  Vitals:   01/09/22 1745 01/09/22 1806 01/09/22 1930 01/09/22 2107  BP:  (!) 160/81 (!) 158/94 (!) 186/86  Pulse: 77 69 75 73  Resp: 19 19 16 20   Temp:   98.9 F (37.2 C) 98.3 F (36.8 C)  TempSrc:    Oral  SpO2: 91% 93% 97% 95%  Weight:    61.2 kg  Height:    5\' 5"  (1.651 m)   Eyes: PERRL, lids and conjunctivae normal ENMT: Mucous membranes are dry.  Neck: normal, supple, no masses, no thyromegaly Respiratory: clear to auscultation  bilaterally, no wheezing, no crackles. Normal respiratory effort. No accessory muscle use.  Cardiovascular: Regular rate and rhythm, no murmurs / rubs / gallops. No extremity edema. 2+ pedal pulses.  Abdomen: no tenderness, no masses palpated. No hepatosplenomegaly. Bowel sounds positive.  Musculoskeletal: no clubbing / cyanosis. No joint deformity upper and lower extremities.  Swelling to left wrist. Skin: no rashes, lesions, ulcers. No induration Neurologic: No facial asymmetry, speech is confused but clear.  Moving all extremities spontaneously.  Psychiatric: Awake, alert, confused, oriented to person..   Labs on Admission: I have personally reviewed  following labs and imaging studies  CBC: Recent Labs  Lab 01/09/22 1522  WBC 11.4*  NEUTROABS 9.6*  HGB 11.8*  HCT 36.6  MCV 90.1  PLT 952   Basic Metabolic Panel: Recent Labs  Lab 01/09/22 1522  NA 126*  K 2.6*  CL 89*  CO2 30  GLUCOSE 142*  BUN 24*  CREATININE 0.90  CALCIUM 8.4*  MG 1.9   GFR: Estimated Creatinine Clearance: 44.9 mL/min (by C-G formula based on SCr of 0.9 mg/dL). Liver Function Tests: Recent Labs  Lab 01/09/22 1522  AST 91*  ALT 72*  ALKPHOS 76  BILITOT 1.1  PROT 6.1*  ALBUMIN 2.6*   Thyroid Function Tests: Recent Labs    01/09/22 1522  TSH 4.848*   Anemia Panel: No results for input(s): VITAMINB12, FOLATE, FERRITIN, TIBC, IRON, RETICCTPCT in the last 72 hours. Urine analysis:    Component Value Date/Time   COLORURINE AMBER (A) 01/09/2022 1751   APPEARANCEUR HAZY (A) 01/09/2022 1751   LABSPEC 1.024 01/09/2022 1751   PHURINE 6.0 01/09/2022 1751   GLUCOSEU NEGATIVE 01/09/2022 1751   HGBUR MODERATE (A) 01/09/2022 1751   BILIRUBINUR NEGATIVE 01/09/2022 1751   KETONESUR NEGATIVE 01/09/2022 1751   PROTEINUR 100 (A) 01/09/2022 1751   NITRITE NEGATIVE 01/09/2022 1751   LEUKOCYTESUR SMALL (A) 01/09/2022 1751    Radiological Exams on Admission: DG Chest 2 View  Result Date:  01/09/2022 CLINICAL DATA:  Fall.  L2 mental status. EXAM: CHEST - 2 VIEW COMPARISON:  Sternum radiographs 12/19/2014 and chest radiographs 09/09/2012 FINDINGS: The cardiac silhouette is mildly enlarged. No airspace consolidation, edema, pleural effusion, or pneumothorax is identified. Minimal scarring is noted in the right mid lung. No acute osseous abnormality is identified. IMPRESSION: No active cardiopulmonary disease. Electronically Signed   By: Logan Bores M.D.   On: 01/09/2022 16:53   DG Wrist Complete Left  Result Date: 01/09/2022 CLINICAL DATA:  Fall EXAM: LEFT WRIST - COMPLETE 4 VIEW COMPARISON:  None. FINDINGS: Plate and screw fixation of the distal radius. Old ulnar styloid process fracture. No evidence of acute fracture or dislocation. Widening of the scapholunate interval. Moderate degenerative changes of the radiocarpal joint. Severe degenerative changes of the first [facility] joint. Soft tissue swelling of the wrist. IMPRESSION: 1. Widening of the scapholunate interval which can be seen in the setting of scapholunate ligament injury. 2. No acute osseous abnormality. Electronically Signed   By: Yetta Glassman M.D.   On: 01/09/2022 16:57   CT Head Wo Contrast  Result Date: 01/09/2022 CLINICAL DATA:  Fall, head trauma EXAM: CT HEAD WITHOUT CONTRAST TECHNIQUE: Contiguous axial images were obtained from the base of the skull through the vertex without intravenous contrast. RADIATION DOSE REDUCTION: This exam was performed according to the departmental dose-optimization program which includes automated exposure control, adjustment of the mA and/or kV according to patient size and/or use of iterative reconstruction technique. COMPARISON:  CT brain 03/17/2017 FINDINGS: Brain: No acute territorial infarction, hemorrhage or intracranial mass. Atrophy and chronic small vessel ischemic changes of the white matter. Stable ventricle size. Vascular: No hyperdense vessels. Carotid and vertebral vascular  calcification Skull: Normal. Negative for fracture or focal lesion. Sinuses/Orbits: No acute finding. Other: None IMPRESSION: 1. No CT evidence for acute intracranial abnormality. Atrophy and chronic small vessel ischemic changes of the white matter. Electronically Signed   By: Donavan Foil M.D.   On: 01/09/2022 16:14   DG Shoulder Left  Result Date: 01/09/2022 CLINICAL DATA:  Fall. EXAM: LEFT SHOULDER -  2+ VIEW COMPARISON:  None. FINDINGS: No acute fracture or dislocation is identified. Mild-to-moderate degenerative changes are noted at the acromioclavicular joint. The soft tissues are unremarkable. IMPRESSION: No acute osseous abnormality identified. Electronically Signed   By: Logan Bores M.D.   On: 01/09/2022 16:52   DG Knee Complete 4 Views Left  Result Date: 01/09/2022 CLINICAL DATA:  Left knee pain after fall EXAM: LEFT KNEE - COMPLETE 4+ VIEW COMPARISON:  X-ray 08/20/2017 FINDINGS: Osseous structures are demineralized. No evidence of an acute fracture. 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 Small knee joint effusion, nonspecific. Prominent atherosclerotic vascular calcifications. Mild soft tissue swelling at the lateral aspect of the knee. IMPRESSION: 1. No acute fracture or dislocation. 2. 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 3. Small knee joint effusion, nonspecific. 4. Mild soft tissue swelling at the lateral aspect of the knee. Electronically Signed   By: Davina Poke D.O.   On: 01/09/2022 16:54    EKG: Independently reviewed.  Sinus, rate 78, QTc 481.  LVH.  No significant change from prior.  Assessment/Plan Principal Problem:   Acute metabolic encephalopathy Active Problems:   HTN (hypertension)   Acute lower UTI   Fall at home, initial encounter   Electrolyte abnormality  Acute metabolic encephalopathy secondary to UTI-WBC 11.4.  Afebrile.  Rules out for sepsis.   UA suggestive of UTI with many bacteria greater than 50 WBC and small leukocytes.  Head CT without acute abnormality.  Portable chest x-ray unremarkable. -IV ceftriaxone 1 g daily -Follow-up urine cultures - N/s + 40 kcl 75cc/hr x 20hrs  Electrolyte abnormality-hyponatremia sodium 126, hypokalemia potassium 2.6.  Mag normal 1.9. -Replete -Telemetry monitoring  Fall-sustaining bruising to left wrist.  X-ray of the left wrist suggest -scapholunate ligament injury.  Fall likely related to acute UTI, and encephalopathy.  .  Imaging included chest x-ray, left knee x-ray, left shoulder x-ray and left wrist x-ray also without acute abnormality. - EDP talked to Dr Amedeo Kinsman-  immobilizing with Velcro splint, follow up as outpatient, does not necessarily need inpatient consult.  Dementia- Ambulates with cane and walker at baseline.  Dementia mild-no short-term memory problems -Resume sertraline, donepezil  Hypertension -Resume lisinopril 5 mg daily  Hypothyroidism-TSH checked in ED 4.84. -Resume Synthroid  DVT prophylaxis: Lovenox Code Status: DNR.  Confirmed with patient's daughter who is primary decision-maker [name] at bedside. Family Communication: Patient's daughter [name] at bedside this primary decision maker.-  Contact [phone]. Disposition Plan: ~ 2 days Consults called: None Admission status: inpt tele I certify that at the point of admission it is my clinical judgment that the patient will require inpatient hospital care spanning beyond 2 midnights from the point of admission due to high intensity of service, high risk for further deterioration and high frequency of surveillance required.   Bethena Roys MD [facility]  01/09/2022, 9:48 PM

## 2022-01-10 LAB — CBC
HCT: 35.2 % — ABNORMAL LOW (ref 36.0–46.0)
Hemoglobin: 11.6 g/dL — ABNORMAL LOW (ref 12.0–15.0)
MCH: 29.9 pg (ref 26.0–34.0)
MCHC: 33 g/dL (ref 30.0–36.0)
MCV: 90.7 fL (ref 80.0–100.0)
Platelets: 225 10*3/uL (ref 150–400)
RBC: 3.88 MIL/uL (ref 3.87–5.11)
RDW: 12.6 % (ref 11.5–15.5)
WBC: 11.7 10*3/uL — ABNORMAL HIGH (ref 4.0–10.5)
nRBC: 0 % (ref 0.0–0.2)

## 2022-01-10 LAB — BASIC METABOLIC PANEL
Anion gap: 10 (ref 5–15)
BUN: 20 mg/dL (ref 8–23)
CO2: 27 mmol/L (ref 22–32)
Calcium: 8.6 mg/dL — ABNORMAL LOW (ref 8.9–10.3)
Chloride: 94 mmol/L — ABNORMAL LOW (ref 98–111)
Creatinine, Ser: 0.64 mg/dL (ref 0.44–1.00)
GFR, Estimated: >60 mL/min (ref >60)
Glucose, Bld: 119 mg/dL — ABNORMAL HIGH (ref 70–99)
Potassium: 3.9 mmol/L (ref 3.5–5.1)
Sodium: 131 mmol/L — ABNORMAL LOW (ref 135–145)

## 2022-01-10 MED ORDER — RISPERIDONE 1 MG PO TABS
1.0000 mg | ORAL_TABLET | Freq: Every day | ORAL | Status: DC
  Administered 2022-01-10 – 2022-01-12 (×3): 1 mg via ORAL
  Filled 2022-01-10 (×3): qty 1

## 2022-01-10 NOTE — Progress Notes (Signed)
Patient was up and trying to get to the stairwell, assisted back to bed. Family notified as they had left the bedside.Tele sitter is in the room.

## 2022-01-10 NOTE — Progress Notes (Signed)
Patient ID: Carol Rosario, female   DOB: June 30, 1941, 81 y.o.   MRN: [id number]   HPI: Carol Rosario is a 81 y.o. female with medical history significant for hypertension, dementia. Patient was brought to the ED via EMS reports of change in mental status.  Family also reports a strong odor to patient's urine.  I time of my evaluation, patient is confused, answers a few questions appropriately.  Patient's daughter Carol Rosario is at bedside. Patient lives with her son, but daughter Carol Rosario checks on patient almost every day.  She reports over the past 2 to 3 Days Patient Has Been Confused, not recognizing family members.  She has also been sleeping most of the day over the past 4 days, with poor oral intake.  Patient has not reported any vomiting, has not had no loose stools, no cough, no difficulty breathing..  She has fallen twice in the past few days, sustaining swelling to her left wrist.  Last fall was yesterday.  At baseline she has dementia, but this is not severe.  Ambulates with walker or cane, she is able to answer simple questions, hold simple conversations.  ED Course: Stable vitals.  WBC 11.4.  Sodium 126.  Potassium 2.6.  UA suggestive of UTI.   Imaging included left wrist x-ray, which suggested scapholunate ligament injury.  Chest x-ray, left knee x-ray, left shoulder x-ray, which were all unremarkable.  Head CT was without acute abnormality. Urine cultures ordered.  COVID, flu negative. IV ceftriaxone started.   EDP talked to Dr. Lorretta Harp immobilizing with Velcro splint and follow-up in office, does not necessarily need to be seen in the hospital. Hospitalist to admit.  Subjective Patient pleasantly confused and demented  Physical Exam: Vitals:   01/09/22 1930 01/09/22 2107 01/10/22 0122 01/10/22 0638  BP: (!) 158/94 (!) 186/86 (!) 179/80 (!) 142/72  Pulse: 75 73 67 (!) 51  Resp: 16 20 20 17   Temp: 98.9 F (37.2 C) 98.3 F (36.8 C) 98.7 F (37.1 C) 98 F (36.7 C)   TempSrc:  Oral Oral Oral  SpO2: 97% 95% 98% 94%  Weight:  61.2 kg    Height:  5\' 5"  (1.651 m)      Constitutional: Pleasantly confused and demented v Vitals:   01/09/22 1930 01/09/22 2107 01/10/22 0122 01/10/22 0638  BP: (!) 158/94 (!) 186/86 (!) 179/80 (!) 142/72  Pulse: 75 73 67 (!) 51  Resp: 16 20 20 17   Temp: 98.9 F (37.2 C) 98.3 F (36.8 C) 98.7 F (37.1 C) 98 F (36.7 C)  TempSrc:  Oral Oral Oral  SpO2: 97% 95% 98% 94%  Weight:  61.2 kg    Height:  5\' 5"  (1.651 m)     Eyes: PERRL, lids and conjunctivae normal ENMT: Mucous membranes are dry.  Neck: normal, supple, no masses, no thyromegaly Respiratory: clear to auscultation bilaterally, no wheezing, no crackles. Normal respiratory effort. No accessory muscle use.  Cardiovascular: Regular rate and rhythm, no murmurs / rubs / gallops. No extremity edema. 2+ pedal pulses.  Abdomen: no tenderness, no masses palpated. No hepatosplenomegaly. Bowel sounds positive.  Musculoskeletal: no clubbing / cyanosis. No joint deformity upper and lower extremities.  Swelling to left wrist with splint Skin: no rashes, lesions, ulcers. No induration Neurologic: No facial asymmetry, speech is confused but clear.  Moving all extremities spontaneously.  Psychiatric: Awake, alert, confused, oriented to person..   Labs on Admission: I have personally reviewed following labs and imaging studies  CBC: Recent Labs  Lab 01/09/22 1522 01/10/22 0537  WBC 11.4* 11.7*  NEUTROABS 9.6*  --   HGB 11.8* 11.6*  HCT 36.6 35.2*  MCV 90.1 90.7  PLT 218 086   Basic Metabolic Panel: Recent Labs  Lab 01/09/22 1522 01/10/22 0537  NA 126* 131*  K 2.6* 3.9  CL 89* 94*  CO2 30 27  GLUCOSE 142* 119*  BUN 24* 20  CREATININE 0.90 0.64  CALCIUM 8.4* 8.6*  MG 1.9  --    GFR: Estimated Creatinine Clearance: 50.5 mL/min (by C-G formula based on SCr of 0.64 mg/dL). Liver Function Tests: Recent Labs  Lab 01/09/22 1522  AST 91*  ALT 72*  ALKPHOS  76  BILITOT 1.1  PROT 6.1*  ALBUMIN 2.6*   Thyroid Function Tests: Recent Labs    01/09/22 1522  TSH 4.848*   Anemia Panel: No results for input(s): VITAMINB12, FOLATE, FERRITIN, TIBC, IRON, RETICCTPCT in the last 72 hours. Urine analysis:    Component Value Date/Time   COLORURINE AMBER (A) 01/09/2022 1751   APPEARANCEUR HAZY (A) 01/09/2022 1751   LABSPEC 1.024 01/09/2022 1751   PHURINE 6.0 01/09/2022 1751   GLUCOSEU NEGATIVE 01/09/2022 1751   HGBUR MODERATE (A) 01/09/2022 1751   BILIRUBINUR NEGATIVE 01/09/2022 1751   KETONESUR NEGATIVE 01/09/2022 1751   PROTEINUR 100 (A) 01/09/2022 1751   NITRITE NEGATIVE 01/09/2022 1751   LEUKOCYTESUR SMALL (A) 01/09/2022 1751    Radiological Exams on Admission: DG Chest 2 View  Result Date: 01/09/2022 CLINICAL DATA:  Fall.  L2 mental status. EXAM: CHEST - 2 VIEW COMPARISON:  Sternum radiographs 12/19/2014 and chest radiographs 09/09/2012 FINDINGS: The cardiac silhouette is mildly enlarged. No airspace consolidation, edema, pleural effusion, or pneumothorax is identified. Minimal scarring is noted in the right mid lung. No acute osseous abnormality is identified. IMPRESSION: No active cardiopulmonary disease. Electronically Signed   By: Logan Bores M.D.   On: 01/09/2022 16:53   DG Wrist Complete Left  Result Date: 01/09/2022 CLINICAL DATA:  Fall EXAM: LEFT WRIST - COMPLETE 4 VIEW COMPARISON:  None. FINDINGS: Plate and screw fixation of the distal radius. Old ulnar styloid process fracture. No evidence of acute fracture or dislocation. Widening of the scapholunate interval. Moderate degenerative changes of the radiocarpal joint. Severe degenerative changes of the first [facility] joint. Soft tissue swelling of the wrist. IMPRESSION: 1. Widening of the scapholunate interval which can be seen in the setting of scapholunate ligament injury. 2. No acute osseous abnormality. Electronically Signed   By: Yetta Glassman M.D.   On: 01/09/2022 16:57   CT  Head Wo Contrast  Result Date: 01/09/2022 CLINICAL DATA:  Fall, head trauma EXAM: CT HEAD WITHOUT CONTRAST TECHNIQUE: Contiguous axial images were obtained from the base of the skull through the vertex without intravenous contrast. RADIATION DOSE REDUCTION: This exam was performed according to the departmental dose-optimization program which includes automated exposure control, adjustment of the mA and/or kV according to patient size and/or use of iterative reconstruction technique. COMPARISON:  CT brain 03/17/2017 FINDINGS: Brain: No acute territorial infarction, hemorrhage or intracranial mass. Atrophy and chronic small vessel ischemic changes of the white matter. Stable ventricle size. Vascular: No hyperdense vessels. Carotid and vertebral vascular calcification Skull: Normal. Negative for fracture or focal lesion. Sinuses/Orbits: No acute finding. Other: None IMPRESSION: 1. No CT evidence for acute intracranial abnormality. Atrophy and chronic small vessel ischemic changes of the white matter. Electronically Signed   By: Donavan Foil M.D.   On: 01/09/2022 16:14   DG Shoulder  Left  Result Date: 01/09/2022 CLINICAL DATA:  Fall. EXAM: LEFT SHOULDER - 2+ VIEW COMPARISON:  None. FINDINGS: No acute fracture or dislocation is identified. Mild-to-moderate degenerative changes are noted at the acromioclavicular joint. The soft tissues are unremarkable. IMPRESSION: No acute osseous abnormality identified. Electronically Signed   By: Logan Bores M.D.   On: 01/09/2022 16:52   DG Knee Complete 4 Views Left  Result Date: 01/09/2022 CLINICAL DATA:  Left knee pain after fall EXAM: LEFT KNEE - COMPLETE 4+ VIEW COMPARISON:  X-ray 08/20/2017 FINDINGS: Osseous structures are demineralized. No evidence of an acute fracture. 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 Small knee joint effusion, nonspecific. Prominent atherosclerotic vascular  calcifications. Mild soft tissue swelling at the lateral aspect of the knee. IMPRESSION: 1. No acute fracture or dislocation. 2. 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 3. Small knee joint effusion, nonspecific. 4. Mild soft tissue swelling at the lateral aspect of the knee. Electronically Signed   By: Davina Poke D.O.   On: 01/09/2022 16:54    EKG: Independently reviewed.  Sinus, rate 78, QTc 481.  LVH.  No significant change from prior.  Assessment/Plan Principal Problem:   Acute metabolic encephalopathy Active Problems:   HTN (hypertension)   Acute lower UTI   Fall at home, initial encounter   Electrolyte abnormality  Acute metabolic encephalopathy secondary to UTI-WBC 11.4.  Afebrile.  Rules out for sepsis.  UA suggestive of UTI with many bacteria greater than 50 WBC and small leukocytes.  Head CT without acute abnormality.  Portable chest x-ray unremarkable. -IV ceftriaxone 1 g daily -Follow-up urine cultures - N/s + 40 kcl 75cc/hr x 20hrs  Electrolyte abnormality-hyponatremia sodium 126, hypokalemia potassium 2.6.  Mag normal 1.9. -Replete -Telemetry monitoring  Fall-sustaining bruising to left wrist.  X-ray of the left wrist suggest -scapholunate ligament injury.  Fall likely related to acute UTI, and encephalopathy.  .  Imaging included chest x-ray, left knee x-ray, left shoulder x-ray and left wrist x-ray also without acute abnormality. - EDP talked to Dr Amedeo Kinsman-  immobilizing with Velcro splint, follow up as outpatient, does not necessarily need inpatient consult.  Dementia- Ambulates with cane and walker at baseline.  Dementia mild-no short-term memory problems -Resume sertraline, donepezil  Hypertension -Resume lisinopril 5 mg daily  Hypothyroidism-TSH checked in ED 4.84. -Resume Synthroid  DVT prophylaxis: Lovenox Code Status: DNR.    Janyra Barillas A MD [facility]  01/10/2022, 9:34 AM

## 2022-01-10 NOTE — Progress Notes (Signed)
Patient confused at baseline, more confused than usual per family. She is a smoker, currently, and has been frequently trying to get up to look for her cigarettes. Messaged Dr. Shanon Brow in regards to getting an order for nicotine patch. Patient having loose stools. Notified Dr. Shanon Brow. She has had a good appetite and no nausea/vomiting. Can get to the [facility] with one assist. Does not recognize that she is in the hospital.

## 2022-01-10 NOTE — Progress Notes (Signed)
°  Transition of Care [facility]) Screening Note   Patient Details  Name: AMILAH GREENSPAN Date of Birth: 1941-08-08   Transition of Care [facility]) CM/SW Contact:    Iona Beard, Hannah Phone Number: 01/10/2022, 12:00 PM    Transition of Care Department [facility]) has reviewed patient and no TOC needs have been identified at this time. We will continue to monitor patient advancement through interdisciplinary progression rounds. If new patient transition needs arise, please place a TOC consult.

## 2022-01-10 NOTE — Plan of Care (Signed)

## 2022-01-10 NOTE — Progress Notes (Signed)
Dr. Shanon Brow notified of patient's behavior after call with daughter, order for 1mg  Risperdal given for bedtime. Patient given this now for sleep. Iv replaced as she had removed that as well.

## 2022-01-10 NOTE — Progress Notes (Signed)
Patient urine culture showing received in lab.

## 2022-01-11 MED ORDER — GUAIFENESIN-DM 100-10 MG/5ML PO SYRP
5.0000 mL | ORAL_SOLUTION | ORAL | Status: DC | PRN
  Administered 2022-01-11: 5 mL via ORAL
  Filled 2022-01-11: qty 5

## 2022-01-11 NOTE — Progress Notes (Addendum)
Patient ID: Carol Rosario, female   DOB: August 14, 1941, 81 y.o.   MRN: [id number]   HPI: Carol Rosario is a 81 y.o. female with medical history significant for hypertension, dementia. Patient was brought to the ED via EMS reports of change in mental status.  Family also reports a strong odor to patient's urine.  I time of my evaluation, patient is confused, answers a few questions appropriately.  Patient's daughter [name] is at bedside. Patient lives with her son, but daughter [name] checks on patient almost every day.  She reports over the past 2 to 3 Days Patient Has Been Confused, not recognizing family members.  She has also been sleeping most of the day over the past 4 days, with poor oral intake.  Patient has not reported any vomiting, has not had no loose stools, no cough, no difficulty breathing..  She has fallen twice in the past few days, sustaining swelling to her left wrist.  Last fall was yesterday.  At baseline she has dementia, but this is not severe.  Ambulates with walker or cane, she is able to answer simple questions, hold simple conversations.  ED Course: Stable vitals.  WBC 11.4.  Sodium 126.  Potassium 2.6.  UA suggestive of UTI.   Imaging included left wrist x-ray, which suggested scapholunate ligament injury.  Chest x-ray, left knee x-ray, left shoulder x-ray, which were all unremarkable.  Head CT was without acute abnormality. Urine cultures ordered.  COVID, flu negative. IV ceftriaxone started.   EDP talked to Dr. Lorretta Harp immobilizing with Velcro splint and follow-up in office, does not necessarily need to be seen in the hospital. Hospitalist to admit.  Subjective Patient pleasantly confused and demented.  Sitter at bedside  Physical Exam: Vitals:   01/10/22 0940 01/10/22 1324 01/10/22 2019 01/11/22 0650  BP: (!) 156/91 (!) 162/94 (!) 162/89 (!) 162/72  Pulse: 84 79 93 70  Resp: 18 18 20 18   Temp: 97.9 F (36.6 C) 97.7 F (36.5 C) 98.4 F (36.9 C) (!) 97.5  F (36.4 C)  TempSrc:   Oral Oral  SpO2: 97% 100% 96% 96%  Weight:      Height:        Constitutional: Pleasantly confused and demented v Vitals:   01/10/22 0940 01/10/22 1324 01/10/22 2019 01/11/22 0650  BP: (!) 156/91 (!) 162/94 (!) 162/89 (!) 162/72  Pulse: 84 79 93 70  Resp: 18 18 20 18   Temp: 97.9 F (36.6 C) 97.7 F (36.5 C) 98.4 F (36.9 C) (!) 97.5 F (36.4 C)  TempSrc:   Oral Oral  SpO2: 97% 100% 96% 96%  Weight:      Height:       Eyes: PERRL, lids and conjunctivae normal ENMT: Mucous membranes are dry.  Neck: normal, supple, no masses, no thyromegaly Respiratory: clear to auscultation bilaterally, no wheezing, no crackles. Normal respiratory effort. No accessory muscle use.  Cardiovascular: Regular rate and rhythm, no murmurs / rubs / gallops. No extremity edema. 2+ pedal pulses.  Abdomen: no tenderness, no masses palpated. No hepatosplenomegaly. Bowel sounds positive.  Musculoskeletal: no clubbing / cyanosis. No joint deformity upper and lower extremities.  Swelling to left wrist with splint Skin: no rashes, lesions, ulcers. No induration Neurologic: No facial asymmetry, speech is confused but clear.  Moving all extremities spontaneously.  Psychiatric: Awake, alert, confused, oriented to person..   Labs on Admission: I have personally reviewed following labs and imaging studies  CBC: Recent Labs  Lab 01/09/22 1522 01/10/22  0537  WBC 11.4* 11.7*  NEUTROABS 9.6*  --   HGB 11.8* 11.6*  HCT 36.6 35.2*  MCV 90.1 90.7  PLT 218 301   Basic Metabolic Panel: Recent Labs  Lab 01/09/22 1522 01/10/22 0537  NA 126* 131*  K 2.6* 3.9  CL 89* 94*  CO2 30 27  GLUCOSE 142* 119*  BUN 24* 20  CREATININE 0.90 0.64  CALCIUM 8.4* 8.6*  MG 1.9  --    GFR: Estimated Creatinine Clearance: 50.5 mL/min (by C-G formula based on SCr of 0.64 mg/dL). Liver Function Tests: Recent Labs  Lab 01/09/22 1522  AST 91*  ALT 72*  ALKPHOS 76  BILITOT 1.1  PROT 6.1*   ALBUMIN 2.6*   Thyroid Function Tests: Recent Labs    01/09/22 1522  TSH 4.848*   Anemia Panel: No results for input(s): VITAMINB12, FOLATE, FERRITIN, TIBC, IRON, RETICCTPCT in the last 72 hours. Urine analysis:    Component Value Date/Time   COLORURINE AMBER (A) 01/09/2022 1751   APPEARANCEUR HAZY (A) 01/09/2022 1751   LABSPEC 1.024 01/09/2022 1751   PHURINE 6.0 01/09/2022 1751   GLUCOSEU NEGATIVE 01/09/2022 1751   HGBUR MODERATE (A) 01/09/2022 1751   BILIRUBINUR NEGATIVE 01/09/2022 1751   KETONESUR NEGATIVE 01/09/2022 1751   PROTEINUR 100 (A) 01/09/2022 1751   NITRITE NEGATIVE 01/09/2022 1751   LEUKOCYTESUR SMALL (A) 01/09/2022 1751    Radiological Exams on Admission: DG Chest 2 View  Result Date: 01/09/2022 CLINICAL DATA:  Fall.  L2 mental status. EXAM: CHEST - 2 VIEW COMPARISON:  Sternum radiographs 12/19/2014 and chest radiographs 09/09/2012 FINDINGS: The cardiac silhouette is mildly enlarged. No airspace consolidation, edema, pleural effusion, or pneumothorax is identified. Minimal scarring is noted in the right mid lung. No acute osseous abnormality is identified. IMPRESSION: No active cardiopulmonary disease. Electronically Signed   By: Logan Bores M.D.   On: 01/09/2022 16:53   DG Wrist Complete Left  Result Date: 01/09/2022 CLINICAL DATA:  Fall EXAM: LEFT WRIST - COMPLETE 4 VIEW COMPARISON:  None. FINDINGS: Plate and screw fixation of the distal radius. Old ulnar styloid process fracture. No evidence of acute fracture or dislocation. Widening of the scapholunate interval. Moderate degenerative changes of the radiocarpal joint. Severe degenerative changes of the first [facility] joint. Soft tissue swelling of the wrist. IMPRESSION: 1. Widening of the scapholunate interval which can be seen in the setting of scapholunate ligament injury. 2. No acute osseous abnormality. Electronically Signed   By: Yetta Glassman M.D.   On: 01/09/2022 16:57   CT Head Wo Contrast  Result Date:  01/09/2022 CLINICAL DATA:  Fall, head trauma EXAM: CT HEAD WITHOUT CONTRAST TECHNIQUE: Contiguous axial images were obtained from the base of the skull through the vertex without intravenous contrast. RADIATION DOSE REDUCTION: This exam was performed according to the departmental dose-optimization program which includes automated exposure control, adjustment of the mA and/or kV according to patient size and/or use of iterative reconstruction technique. COMPARISON:  CT brain 03/17/2017 FINDINGS: Brain: No acute territorial infarction, hemorrhage or intracranial mass. Atrophy and chronic small vessel ischemic changes of the white matter. Stable ventricle size. Vascular: No hyperdense vessels. Carotid and vertebral vascular calcification Skull: Normal. Negative for fracture or focal lesion. Sinuses/Orbits: No acute finding. Other: None IMPRESSION: 1. No CT evidence for acute intracranial abnormality. Atrophy and chronic small vessel ischemic changes of the white matter. Electronically Signed   By: Donavan Foil M.D.   On: 01/09/2022 16:14   DG Shoulder Left  Result Date:  01/09/2022 CLINICAL DATA:  Fall. EXAM: LEFT SHOULDER - 2+ VIEW COMPARISON:  None. FINDINGS: No acute fracture or dislocation is identified. Mild-to-moderate degenerative changes are noted at the acromioclavicular joint. The soft tissues are unremarkable. IMPRESSION: No acute osseous abnormality identified. Electronically Signed   By: Logan Bores M.D.   On: 01/09/2022 16:52   DG Knee Complete 4 Views Left  Result Date: 01/09/2022 CLINICAL DATA:  Left knee pain after fall EXAM: LEFT KNEE - COMPLETE 4+ VIEW COMPARISON:  X-ray 08/20/2017 FINDINGS: Osseous structures are demineralized. No evidence of an acute fracture. 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 Small knee joint effusion, nonspecific. Prominent atherosclerotic vascular calcifications. Mild soft tissue  swelling at the lateral aspect of the knee. IMPRESSION: 1. No acute fracture or dislocation. 2. 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 3. Small knee joint effusion, nonspecific. 4. Mild soft tissue swelling at the lateral aspect of the knee. Electronically Signed   By: Davina Poke D.O.   On: 01/09/2022 16:54    EKG: Independently reviewed.  Sinus, rate 78, QTc 481.  LVH.  No significant change from prior.  Assessment/Plan Principal Problem:   Acute metabolic encephalopathy Active Problems:   HTN (hypertension)   Acute lower UTI   Fall at home, initial encounter   Electrolyte abnormality  Acute metabolic encephalopathy secondary to UTI-WBC 11.4.  Afebrile.  Rules out for sepsis.  UA suggestive of UTI with many bacteria greater than 50 WBC and small leukocytes.  Head CT without acute abnormality.  Portable chest x-ray unremarkable. -IV ceftriaxone 1 g daily -Follow-up urine cultures - N/s + 40 kcl 75cc/hr x 20hrs  Electrolyte abnormality-hyponatremia sodium 126, hypokalemia potassium 2.6.  Mag normal 1.9. -Replete -Telemetry monitoring  Fall-sustaining bruising to left wrist.  X-ray of the left wrist suggest -scapholunate ligament injury.  Fall likely related to acute UTI, and encephalopathy.  .  Imaging included chest x-ray, left knee x-ray, left shoulder x-ray and left wrist x-ray also without acute abnormality. - EDP talked to Dr Amedeo Kinsman-  immobilizing with Velcro splint, follow up as outpatient, does not necessarily need inpatient consult.  Dementia- Ambulates with cane and walker at baseline.  Dementia mild-no short-term memory problems -Resume sertraline, donepezil  Hypertension -Resume lisinopril 5 mg daily  Hypothyroidism-TSH checked in ED 4.84. -Resume Synthroid  Discharge pending final urine culture results Attempted to call daughter on phone no answer  DVT prophylaxis: Lovenox Code Status: DNR.    Aleksa Collinsworth A MD [facility]  01/11/2022, 8:59 AM

## 2022-01-11 NOTE — Progress Notes (Signed)
Patient is resting comfortably. Bed at lowest level, alarm on, fall mats in place, and call bell is within reach!

## 2022-01-12 DIAGNOSIS — L8990 Pressure ulcer of unspecified site, unspecified stage: Secondary | ICD-10-CM | POA: Insufficient documentation

## 2022-01-12 LAB — CBC
HCT: 36.4 % (ref 36.0–46.0)
Hemoglobin: 11.6 g/dL — ABNORMAL LOW (ref 12.0–15.0)
MCH: 29.7 pg (ref 26.0–34.0)
MCHC: 31.9 g/dL (ref 30.0–36.0)
MCV: 93.3 fL (ref 80.0–100.0)
Platelets: 277 10*3/uL (ref 150–400)
RBC: 3.9 MIL/uL (ref 3.87–5.11)
RDW: 13.2 % (ref 11.5–15.5)
WBC: 10.9 10*3/uL — ABNORMAL HIGH (ref 4.0–10.5)
nRBC: 0 % (ref 0.0–0.2)

## 2022-01-12 LAB — BASIC METABOLIC PANEL
Anion gap: 6 (ref 5–15)
BUN: 19 mg/dL (ref 8–23)
CO2: 30 mmol/L (ref 22–32)
Calcium: 8.6 mg/dL — ABNORMAL LOW (ref 8.9–10.3)
Chloride: 100 mmol/L (ref 98–111)
Creatinine, Ser: 0.8 mg/dL (ref 0.44–1.00)
GFR, Estimated: >60 mL/min (ref >60)
Glucose, Bld: 98 mg/dL (ref 70–99)
Potassium: 3.6 mmol/L (ref 3.5–5.1)
Sodium: 136 mmol/L (ref 135–145)

## 2022-01-12 LAB — URINE CULTURE: Culture: >=100000 — AB

## 2022-01-12 MED ORDER — CEPHALEXIN 500 MG PO CAPS
500.0000 mg | ORAL_CAPSULE | Freq: Two times a day (BID) | ORAL | Status: DC
  Administered 2022-01-12: 500 mg via ORAL
  Filled 2022-01-12 (×2): qty 1

## 2022-01-12 NOTE — Progress Notes (Signed)
Patient ID: Carol Rosario, female   DOB: May 25, 1941, 81 y.o.   MRN: [id number]      HPI: Carol Rosario is a 81 y.o. female with medical history significant for hypertension, dementia. Patient was brought to the ED via EMS reports of change in mental status.  Family also reports a strong odor to patient's urine.  Patient lives with her son, but daughter [name] checks on patient almost every day.     Subjective Patient pleasantly confused and demented.  Sitter at bedside  Physical Exam: Vitals:   01/11/22 0650 01/11/22 1427 01/11/22 2156 01/12/22 0610  BP: (!) 162/72 140/63 (!) 142/79 140/78  Pulse: 70 80 82 89  Resp: 18 16 14 17   Temp: (!) 97.5 F (36.4 C)  98.6 F (37 C) 98.7 F (37.1 C)  TempSrc: Oral     SpO2: 96% 90% 94% 94%  Weight:      Height:        Constitutional: Pleasantly confused and demented v Vitals:   01/11/22 0650 01/11/22 1427 01/11/22 2156 01/12/22 0610  BP: (!) 162/72 140/63 (!) 142/79 140/78  Pulse: 70 80 82 89  Resp: 18 16 14 17   Temp: (!) 97.5 F (36.4 C)  98.6 F (37 C) 98.7 F (37.1 C)  TempSrc: Oral     SpO2: 96% 90% 94% 94%  Weight:      Height:        General: Appearance:    Elderly female in no acute distress     Lungs:     respirations unlabored  Heart:    Normal heart rate.    MS:   All extremities are intact.    Neurologic:   Awake, alert, still confused-- not oriented to person/place- "lives with her parents, never married and no children"    Labs on Admission: I have personally reviewed following labs and imaging studies  CBC: Recent Labs  Lab 01/09/22 1522 01/10/22 0537 01/12/22 0830  WBC 11.4* 11.7* 10.9*  NEUTROABS 9.6*  --   --   HGB 11.8* 11.6* 11.6*  HCT 36.6 35.2* 36.4  MCV 90.1 90.7 93.3  PLT 218 225 656   Basic Metabolic Panel: Recent Labs  Lab 01/09/22 1522 01/10/22 0537 01/12/22 0830  NA 126* 131* 136  K 2.6* 3.9 3.6  CL 89* 94* 100  CO2 30 27 30   GLUCOSE 142* 119* 98  BUN 24* 20 19   CREATININE 0.90 0.64 0.80  CALCIUM 8.4* 8.6* 8.6*  MG 1.9  --   --    GFR: Estimated Creatinine Clearance: 50.5 mL/min (by C-G formula based on SCr of 0.8 mg/dL). Liver Function Tests: Recent Labs  Lab 01/09/22 1522  AST 91*  ALT 72*  ALKPHOS 76  BILITOT 1.1  PROT 6.1*  ALBUMIN 2.6*   Thyroid Function Tests: Recent Labs    01/09/22 1522  TSH 4.848*   Anemia Panel: No results for input(s): VITAMINB12, FOLATE, FERRITIN, TIBC, IRON, RETICCTPCT in the last 72 hours. Urine analysis:    Component Value Date/Time   COLORURINE AMBER (A) 01/09/2022 1751   APPEARANCEUR HAZY (A) 01/09/2022 1751   LABSPEC 1.024 01/09/2022 1751   PHURINE 6.0 01/09/2022 1751   GLUCOSEU NEGATIVE 01/09/2022 1751   HGBUR MODERATE (A) 01/09/2022 1751   BILIRUBINUR NEGATIVE 01/09/2022 1751   KETONESUR NEGATIVE 01/09/2022 1751   PROTEINUR 100 (A) 01/09/2022 1751   NITRITE NEGATIVE 01/09/2022 1751   LEUKOCYTESUR SMALL (A) 01/09/2022 1751      Assessment/Plan Principal  Problem:   Acute metabolic encephalopathy Active Problems:   HTN (hypertension)   Acute lower UTI   Fall at home, initial encounter   Electrolyte abnormality   Pressure injury of skin     Acute metabolic encephalopathy secondary to UTI- kleb pna -UA suggestive of UTI with many bacteria greater than 50 WBC and small leukocytes.  Head CT without acute abnormality.  Portable chest x-ray unremarkable. -IV ceftriaxone 1 g daily -await sensitivities  Electrolyte abnormality (hyponatremia/hypokalemia) -Repleted  Fall-sustaining bruising to left wrist.   -X-ray suggestive of scapholunate ligament injury.  Fall likely related to acute UTI, and encephalopathy.  .  Imaging included chest x-ray, left knee x-ray, left shoulder x-ray and left wrist x-ray also without acute abnormality. - EDP talked to Dr Amedeo Kinsman-  immobilizing with Velcro splint, follow up as outpatient, does not necessarily need inpatient consult -PT eval for dispo  planning (from home with son)  Dementia- Ambulates with cane and walker at baseline.  Dementia mild-no short-term memory problems -Resume sertraline, donepezil -delirium precautions  Hypertension -Resume lisinopril 5 mg daily  Hypothyroidism-TSH checked in ED 4.84. -Resume Synthroid -outpatient follow up  Pressure Injury 01/09/22 Hip Right Stage 1 -  Intact skin with non-blanchable redness of a localized area usually over a bony prominence. 4cm x 4cm stage 1 to right hip (Active)  01/09/22 2210  Location: Hip  Location Orientation: Right  Staging: Stage 1 -  Intact skin with non-blanchable redness of a localized area usually over a bony prominence.  Wound Description (Comments): 4cm x 4cm stage 1 to right hip  Present on Admission: Yes       Discharge pending final urine culture results/PT Eval  Spoke with daughter on phone 1/29  DVT prophylaxis: Lovenox Code Status: DNR.    Geradine Girt DO [facility]  01/12/2022, 11:19 AM

## 2022-01-12 NOTE — Plan of Care (Signed)

## 2022-01-12 NOTE — Progress Notes (Signed)
Patient pulled her IV out. MD Eliseo Squires notified. PO antibiotics ordered instead of IV.

## 2022-01-12 NOTE — Evaluation (Signed)
Physical Therapy Evaluation Patient Details Name: Carol Rosario MRN: [id number] DOB: [birth year] Today's Date: 01/12/2022  History of Present Illness  Carol Rosario is a 81 y.o. female with medical history significant for hypertension, dementia.  Patient was brought to the ED via EMS reports of change in mental status.  Family also reports a strong odor to patient's urine.  I time of my evaluation, patient is confused, answers a few questions appropriately.  Patient's daughter Carol Rosario is at bedside.  Patient lives with her son, but daughter Carol Rosario checks on patient almost every day.  She reports over the past 2 to 3 Days Patient Has Been Confused, not recognizing family members.  She has also been sleeping most of the day over the past 4 days, with poor oral intake.  Patient has not reported any vomiting, has not had no loose stools, no cough, no difficulty breathing..  She has fallen twice in the past few days, sustaining swelling to her left wrist.  Last fall was yesterday.  At baseline she has dementia, but this is not severe.  Ambulates with walker or cane  Clinical Impression  PT seen for evaluation.  Pt is able to states that she lives with her son and her daughter checks in on her, she lives in [place] and that she is at the hospital.  Pt is at baseline of functioning no need for skilled PT at this time.        Recommendations for follow up therapy are one component of a multi-disciplinary discharge planning process, led by the attending physician.  Recommendations may be updated based on patient status, additional functional criteria and insurance authorization.  Follow Up Recommendations No PT follow up    Assistance Recommended at Discharge Intermittent Supervision/Assistance (due to dementia)  Patient can return home with the following  Other (comment) (functionally pt is mod I)    Equipment Recommendations  none  Recommendations for Other Services    none   Functional Status  Assessment Patient has not had a recent decline in their functional status     Precautions / Restrictions Precautions Precautions: Fall      Mobility  Bed Mobility Overal bed mobility: Modified Independent                  Transfers Overall transfer level: Modified independent Equipment used: Rolling walker (2 wheels)                    Ambulation/Gait Ambulation/Gait assistance: Modified independent (Device/Increase time) Gait Distance (Feet): 100 Feet Assistive device: Rolling walker (2 wheels) Gait Pattern/deviations: WFL(Within Functional Limits)          Stairs            Wheelchair Mobility    Modified Rankin (Stroke Patients Only)       Balance                                             Pertinent Vitals/Pain Pain Assessment Pain Assessment: No/denies pain    Home Living Family/patient expects to be discharged to:: Private residence Living Arrangements: Children Available Help at Discharge: Available 24 hours/day Type of Home: House Home Access: Stairs to enter       Home Layout: One level Home Equipment: Conservation officer, nature (2 wheels);BSC/3in1;Shower seat      Prior Function Prior Level of Function :  History of Falls (last six months)                     Hand Dominance        Extremity/Trunk Assessment        Lower Extremity Assessment Lower Extremity Assessment: Overall WFL for tasks assessed       Communication   Communication: No difficulties  Cognition Arousal/Alertness: Awake/alert   Overall Cognitive Status: Within Functional Limits for tasks assessed                                                 Assessment/Plan    PT Assessment Patient does not need any further PT services   AM-PAC PT "6 Clicks" Mobility  Outcome Measure Help needed turning from your back to your side while in a flat bed without using bedrails?: None Help needed moving from lying on  your back to sitting on the side of a flat bed without using bedrails?: None Help needed moving to and from a bed to a chair (including a wheelchair)?: None Help needed standing up from a chair using your arms (e.g., wheelchair or bedside chair)?: None Help needed to walk in hospital room?: None Help needed climbing 3-5 steps with a railing? : A Little 6 Click Score: 23    End of Session Equipment Utilized During Treatment: Gait belt Activity Tolerance: Patient tolerated treatment well Patient left: in bed;with bed alarm set Nurse Communication: Mobility status      Time: 1610-9604 PT Time Calculation (min) (ACUTE ONLY): 21 min   Charges:   PT Evaluation $PT Eval Low Complexity: [place], PT CLT [phone]  01/12/2022, 12:36 PM

## 2022-01-12 NOTE — Progress Notes (Signed)
°  Transition of Care [facility]) Screening Note   Patient Details  Name: Carol Rosario Date of Birth: [birth year]   Transition of Care [facility]) CM/SW Contact:    Iona Beard, [place] Phone Number: 01/12/2022, 11:49 AM    Transition of Care Department [facility]) has reviewed patient and no TOC needs have been identified at this time. We will continue to monitor patient advancement through interdisciplinary progression rounds. If new patient transition needs arise, please place a TOC consult.

## 2022-01-12 NOTE — Progress Notes (Signed)
Patient asleep. 1:1 sitter at bedside. No distress noted. Will continue to monitor.

## 2022-01-13 MED ORDER — ACETAMINOPHEN 325 MG PO TABS: 650.0000 mg | ORAL_TABLET | Freq: Four times a day (QID) | ORAL | Status: AC | PRN

## 2022-01-13 MED ORDER — LISINOPRIL 10 MG PO TABS: 10.0000 mg | ORAL_TABLET | Freq: Every day | ORAL | Status: DC

## 2022-01-13 MED ORDER — CEFDINIR 300 MG PO CAPS
300.0000 mg | ORAL_CAPSULE | Freq: Two times a day (BID) | ORAL | Status: DC
  Administered 2022-01-13: 300 mg via ORAL
  Filled 2022-01-13: qty 1

## 2022-01-13 MED ORDER — LISINOPRIL 5 MG PO TABS
5.0000 mg | ORAL_TABLET | Freq: Once | ORAL | Status: AC
  Administered 2022-01-13: 5 mg via ORAL
  Filled 2022-01-13: qty 1

## 2022-01-13 MED ORDER — CEFDINIR 300 MG PO CAPS: 300.0000 mg | ORAL_CAPSULE | Freq: Two times a day (BID) | ORAL | 0 refills | Status: AC

## 2022-01-13 MED ORDER — LISINOPRIL 10 MG PO TABS: 10.0000 mg | ORAL_TABLET | Freq: Every day | ORAL | 0 refills | Status: AC

## 2022-01-13 MED ORDER — HYDRALAZINE HCL 10 MG PO TABS: 10.0000 mg | ORAL_TABLET | Freq: Four times a day (QID) | ORAL | Status: DC | PRN

## 2022-01-13 NOTE — Discharge Summary (Signed)
Physician Discharge Summary  Carol Rosario XTK:[id number] DOB: 01-24-1941 DOA: 01/09/2022  PCP: Redmond School, MD  Admit date: 01/09/2022 Discharge date: 01/13/2022  Admitted From: home Discharge disposition: home with family supervision-- high risk to wander   Recommendations for Outpatient Follow-Up:   Dr Amedeo Kinsman-  immobilizing with Velcro splint, follow up as outpatient Monitor TSH   Discharge Diagnosis:   Principal Problem:   Acute metabolic encephalopathy Active Problems:   HTN (hypertension)   Acute lower UTI   Fall at home, initial encounter   Electrolyte abnormality   Pressure injury of skin    Discharge Condition: Improved.  Diet recommendation: regular  Wound care: None.  Code status: Full.   History of Present Illness:   Carol Rosario is a 81 y.o. female with medical history significant for hypertension, dementia. Patient was brought to the ED via EMS reports of change in mental status.  Family also reports a strong odor to patient's urine.  I time of my evaluation, patient is confused, answers a few questions appropriately.  Patient's daughter [name] is at bedside. Patient lives with her son, but daughter [name] checks on patient almost every day.  She reports over the past 2 to 3 Days Patient Has Been Confused, not recognizing family members.  She has also been sleeping most of the day over the past 4 days, with poor oral intake.  Patient has not reported any vomiting, has not had no loose stools, no cough, no difficulty breathing..  She has fallen twice in the past few days, sustaining swelling to her left wrist.  Last fall was yesterday.  At baseline she has dementia, but this is not severe.  Ambulates with walker or cane, she is able to answer simple questions, hold simple conversations.   Hospital Course by Problem:   Acute metabolic encephalopathy secondary to UTI- kleb pna -UA suggestive of UTI with many bacteria greater than 50 WBC and  small leukocytes.  Head CT without acute abnormality.  Portable chest x-ray unremarkable. -finish treatment with omnicef    Electrolyte abnormality (hyponatremia/hypokalemia) -Repleted   Fall-sustaining bruising to left wrist.   -X-ray suggestive of scapholunate ligament injury.  Fall likely related to acute UTI, and encephalopathy.  .  Imaging included chest x-ray, left knee x-ray, left shoulder x-ray and left wrist x-ray also without acute abnormality. - EDP talked to Dr Amedeo Kinsman-  immobilizing with Velcro splint, follow up as outpatient, does not need inpatient consult  Dementia- Ambulates with cane and walker at baseline.  Dementia mild-no short-term memory problems -Resume sertraline, donepezil -delirium precautions   Hypertension -Resume lisinopril but increase to 10 mg   Hypothyroidism-TSH checked in ED 4.84. -Resume Synthroid -outpatient follow up   Pressure Injury 01/09/22 Hip Right Stage 1 -  Intact skin with non-blanchable redness of a localized area usually over a bony prominence. 4cm x 4cm stage 1 to right hip (Active)  01/09/22 2210  Location: Hip  Location Orientation: Right  Staging: Stage 1 -  Intact skin with non-blanchable redness of a localized area usually over a bony prominence.  Wound Description (Comments): 4cm x 4cm stage 1 to right hip  Present on Admission: Yes         Medical Consultants:   Ortho (phone)   Discharge Exam:   Vitals:   01/12/22 2002 01/13/22 0513  BP: (!) 155/78 (!) 172/78  Pulse: (!) 103 88  Resp: 20 20  Temp: 98.2 F (36.8 C) 98.3 F (36.8 C)  SpO2: 94% 94%   Vitals:   01/12/22 0610 01/12/22 1430 01/12/22 2002 01/13/22 0513  BP: 140/78 136/74 (!) 155/78 (!) 172/78  Pulse: 89 94 (!) 103 88  Resp: 17 19 20 20   Temp: 98.7 F (37.1 C) 98.4 F (36.9 C) 98.2 F (36.8 C) 98.3 F (36.8 C)  TempSrc:  Oral Oral Oral  SpO2: 94% 96% 94% 94%  Weight:      Height:        General exam: Appears calm and  comfortable.    The results of significant diagnostics from this hospitalization (including imaging, microbiology, ancillary and laboratory) are listed below for reference.     Procedures and Diagnostic Studies:   DG Chest 2 View  Result Date: 01/09/2022 CLINICAL DATA:  Fall.  L2 mental status. EXAM: CHEST - 2 VIEW COMPARISON:  Sternum radiographs 12/19/2014 and chest radiographs 09/09/2012 FINDINGS: The cardiac silhouette is mildly enlarged. No airspace consolidation, edema, pleural effusion, or pneumothorax is identified. Minimal scarring is noted in the right mid lung. No acute osseous abnormality is identified. IMPRESSION: No active cardiopulmonary disease. Electronically Signed   By: Logan Bores M.D.   On: 01/09/2022 16:53   DG Wrist Complete Left  Result Date: 01/09/2022 CLINICAL DATA:  Fall EXAM: LEFT WRIST - COMPLETE 4 VIEW COMPARISON:  None. FINDINGS: Plate and screw fixation of the distal radius. Old ulnar styloid process fracture. No evidence of acute fracture or dislocation. Widening of the scapholunate interval. Moderate degenerative changes of the radiocarpal joint. Severe degenerative changes of the first [facility] joint. Soft tissue swelling of the wrist. IMPRESSION: 1. Widening of the scapholunate interval which can be seen in the setting of scapholunate ligament injury. 2. No acute osseous abnormality. Electronically Signed   By: Yetta Glassman M.D.   On: 01/09/2022 16:57   CT Head Wo Contrast  Result Date: 01/09/2022 CLINICAL DATA:  Fall, head trauma EXAM: CT HEAD WITHOUT CONTRAST TECHNIQUE: Contiguous axial images were obtained from the base of the skull through the vertex without intravenous contrast. RADIATION DOSE REDUCTION: This exam was performed according to the departmental dose-optimization program which includes automated exposure control, adjustment of the mA and/or kV according to patient size and/or use of iterative reconstruction technique. COMPARISON:  CT brain  03/17/2017 FINDINGS: Brain: No acute territorial infarction, hemorrhage or intracranial mass. Atrophy and chronic small vessel ischemic changes of the white matter. Stable ventricle size. Vascular: No hyperdense vessels. Carotid and vertebral vascular calcification Skull: Normal. Negative for fracture or focal lesion. Sinuses/Orbits: No acute finding. Other: None IMPRESSION: 1. No CT evidence for acute intracranial abnormality. Atrophy and chronic small vessel ischemic changes of the white matter. Electronically Signed   By: Donavan Foil M.D.   On: 01/09/2022 16:14   DG Shoulder Left  Result Date: 01/09/2022 CLINICAL DATA:  Fall. EXAM: LEFT SHOULDER - 2+ VIEW COMPARISON:  None. FINDINGS: No acute fracture or dislocation is identified. Mild-to-moderate degenerative changes are noted at the acromioclavicular joint. The soft tissues are unremarkable. IMPRESSION: No acute osseous abnormality identified. Electronically Signed   By: Logan Bores M.D.   On: 01/09/2022 16:52   DG Knee Complete 4 Views Left  Result Date: 01/09/2022 CLINICAL DATA:  Left knee pain after fall EXAM: LEFT KNEE - COMPLETE 4+ VIEW COMPARISON:  X-ray 08/20/2017 FINDINGS: Osseous structures are demineralized. No evidence of an acute fracture. 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 Small knee joint effusion, nonspecific. Prominent atherosclerotic vascular calcifications. Mild soft tissue swelling  at the lateral aspect of the knee. IMPRESSION: 1. No acute fracture or dislocation. 2. 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 3. Small knee joint effusion, nonspecific. 4. Mild soft tissue swelling at the lateral aspect of the knee. Electronically Signed   By: Davina Poke D.O.   On: 01/09/2022 16:54     Labs:   Basic Metabolic Panel: Recent Labs  Lab 01/09/22 1522 01/10/22 0537 01/12/22 0830  NA 126* 131* 136  K 2.6* 3.9 3.6   CL 89* 94* 100  CO2 30 27 30   GLUCOSE 142* 119* 98  BUN 24* 20 19  CREATININE 0.90 0.64 0.80  CALCIUM 8.4* 8.6* 8.6*  MG 1.9  --   --    GFR Estimated Creatinine Clearance: 50.5 mL/min (by C-G formula based on SCr of 0.8 mg/dL). Liver Function Tests: Recent Labs  Lab 01/09/22 1522  AST 91*  ALT 72*  ALKPHOS 76  BILITOT 1.1  PROT 6.1*  ALBUMIN 2.6*   No results for input(s): LIPASE, AMYLASE in the last 168 hours. No results for input(s): AMMONIA in the last 168 hours. Coagulation profile No results for input(s): INR, PROTIME in the last 168 hours.  CBC: Recent Labs  Lab 01/09/22 1522 01/10/22 0537 01/12/22 0830  WBC 11.4* 11.7* 10.9*  NEUTROABS 9.6*  --   --   HGB 11.8* 11.6* 11.6*  HCT 36.6 35.2* 36.4  MCV 90.1 90.7 93.3  PLT 218 225 277   Cardiac Enzymes: No results for input(s): CKTOTAL, CKMB, CKMBINDEX, TROPONINI in the last 168 hours. BNP: Invalid input(s): POCBNP CBG: No results for input(s): GLUCAP in the last 168 hours. D-Dimer No results for input(s): DDIMER in the last 72 hours. Hgb A1c No results for input(s): HGBA1C in the last 72 hours. Lipid Profile No results for input(s): CHOL, HDL, LDLCALC, TRIG, CHOLHDL, LDLDIRECT in the last 72 hours. Thyroid function studies No results for input(s): TSH, T4TOTAL, T3FREE, THYROIDAB in the last 72 hours.  Invalid input(s): FREET3 Anemia work up No results for input(s): VITAMINB12, FOLATE, FERRITIN, TIBC, IRON, RETICCTPCT in the last 72 hours. Microbiology Recent Results (from the past 240 hour(s))  Resp Panel by RT-PCR (Flu A&B, Covid) Urine, Clean Catch     Status: None   Collection Time: 01/09/22  5:50 PM   Specimen: Urine, Clean Catch; Nasopharyngeal(NP) swabs in vial transport medium  Result Value Ref Range Status   SARS Coronavirus 2 by RT PCR NEGATIVE NEGATIVE Final    Comment: (NOTE) SARS-CoV-2 target nucleic acids are NOT DETECTED.  The SARS-CoV-2 RNA is generally detectable in upper  respiratory specimens during the acute phase of infection. The lowest concentration of SARS-CoV-2 viral copies this assay can detect is 138 copies/mL. A negative result does not preclude SARS-Cov-2 infection and should not be used as the sole basis for treatment or other patient management decisions. A negative result may occur with  improper specimen collection/handling, submission of specimen other than nasopharyngeal swab, presence of viral mutation(s) within the areas targeted by this assay, and inadequate number of viral copies(<138 copies/mL). A negative result must be combined with clinical observations, patient history, and epidemiological information. The expected result is Negative.  Fact Sheet for Patients:  EntrepreneurPulse.com.au  Fact Sheet for Healthcare Providers:  IncredibleEmployment.be  This test is no t yet approved or cleared by the [place] FDA and  has been authorized for detection and/or diagnosis of SARS-CoV-2 by FDA under an Emergency Use Authorization (EUA). This EUA will remain  in effect (meaning this  test can be used) for the duration of the COVID-19 declaration under Section 564(b)(1) of the Act, 21 U.S.C.section 360bbb-3(b)(1), unless the authorization is terminated  or revoked sooner.       Influenza A by PCR NEGATIVE NEGATIVE Final   Influenza B by PCR NEGATIVE NEGATIVE Final    Comment: (NOTE) The Xpert Xpress SARS-CoV-2/FLU/RSV plus assay is intended as an aid in the diagnosis of influenza from Nasopharyngeal swab specimens and should not be used as a sole basis for treatment. Nasal washings and aspirates are unacceptable for Xpert Xpress SARS-CoV-2/FLU/RSV testing.  Fact Sheet for Patients: EntrepreneurPulse.com.au  Fact Sheet for Healthcare Providers: IncredibleEmployment.be  This test is not yet approved or cleared by the [place] FDA and has been  authorized for detection and/or diagnosis of SARS-CoV-2 by FDA under an Emergency Use Authorization (EUA). This EUA will remain in effect (meaning this test can be used) for the duration of the COVID-19 declaration under Section 564(b)(1) of the Act, 21 U.S.C. section 360bbb-3(b)(1), unless the authorization is terminated or revoked.  Performed at [facility], [address] [place], [place] [address]   Urine Culture     Status: Abnormal   Collection Time: 01/09/22  5:51 PM   Specimen: Urine, Clean Catch  Result Value Ref Range Status   Specimen Description   Final    URINE, CLEAN CATCH Performed at [facility], [address], [place], [place] [address]    Special Requests   Final    NONE Performed at [facility], [address] [place], [place] [address]    Culture >=100,000 COLONIES/mL KLEBSIELLA PNEUMONIAE (A)  Final   Report Status 01/12/2022 FINAL  Final   Organism ID, Bacteria KLEBSIELLA PNEUMONIAE (A)  Final      Susceptibility   Klebsiella pneumoniae - MIC*    AMPICILLIN >=32 RESISTANT Resistant     CEFAZOLIN >=64 RESISTANT Resistant     CEFEPIME 0.5 SENSITIVE Sensitive     CEFTRIAXONE <=0.25 SENSITIVE Sensitive     CIPROFLOXACIN <=0.25 SENSITIVE Sensitive     GENTAMICIN <=1 SENSITIVE Sensitive     IMIPENEM 0.5 SENSITIVE Sensitive     NITROFURANTOIN 64 INTERMEDIATE Intermediate     TRIMETH/SULFA <=20 SENSITIVE Sensitive     AMPICILLIN/SULBACTAM >=32 RESISTANT Resistant     PIP/TAZO >=128 RESISTANT Resistant     * >=100,000 COLONIES/mL KLEBSIELLA PNEUMONIAE     Discharge Instructions:   Discharge Instructions     Diet general   Complete by: As directed    Increase activity slowly   Complete by: As directed    No wound care   Complete by: As directed    Wrist splint   Complete by: As directed       Allergies as of 01/13/2022       Reactions   Codeine Shortness Of Breath, Itching        Medication List     STOP taking these medications     ibuprofen 800 MG tablet Commonly known as: ADVIL   methocarbamol 500 MG tablet Commonly known as: ROBAXIN       TAKE these medications    acetaminophen 325 MG tablet Commonly known as: TYLENOL Take 2 tablets (650 mg total) by mouth every 6 (six) hours as needed for mild pain (or Fever >/= 101).   aspirin 325 MG EC tablet Take 1 tablet (325 mg total) by mouth 2 (two) times daily. What changed: when to take this   atorvastatin 80 MG tablet Commonly known  as: LIPITOR TAKE 1 TABLET BY MOUTH ONCE A DAY.   cefdinir 300 MG capsule Commonly known as: OMNICEF Take 1 capsule (300 mg total) by mouth every 12 (twelve) hours.   donepezil 5 MG tablet Commonly known as: ARICEPT Take 5 mg by mouth at bedtime.   fish oil-omega-3 fatty acids 1000 MG capsule Take 2 g by mouth daily.   HYDROcodone-acetaminophen 5-325 MG tablet Commonly known as: NORCO/VICODIN Take 1-2 tablets by mouth every 6 (six) hours as needed.   levothyroxine 50 MCG tablet Commonly known as: SYNTHROID Take 50 mcg by mouth daily.   lisinopril 10 MG tablet Commonly known as: ZESTRIL Take 1 tablet (10 mg total) by mouth at bedtime. What changed:  medication strength how much to take   sertraline 25 MG tablet Commonly known as: ZOLOFT Take 25 mg by mouth every morning.          Time coordinating discharge: 35 min  Signed:  Geradine Girt DO  [facility] 01/13/2022, 8:39 AM

## 2022-01-13 NOTE — Progress Notes (Signed)
Went over discharge instruction with pt's daughter. Daughter asked about bruising to left side of pt's head. Bruising appeared to be yellow/greenish in color. Daughter stated, " this wasn't here yesterday and I want to know what happened." Notified charge nurse Bevely Palmer) and also director Adrian Prince. Director assessed pt's head with this nurse and addressed families concerns. Pt was taken down the main entrance via wheel chair by this nurse.

## 2022-01-13 NOTE — Care Management Important Message (Signed)
Important Message  Patient Details  Name: Carol Rosario MRN: [id number] Date of Birth: May 26, 1941   Medicare Important Message Given:  Yes     Tommy Medal 01/13/2022, 11:32 AM

## 2022-01-13 NOTE — Discharge Instructions (Signed)
Ortho, Dr Amedeo Kinsman-  immobilizing with Velcro splint, follow up as outpatient

## 2022-01-20 DIAGNOSIS — I10 Essential (primary) hypertension: Secondary | ICD-10-CM | POA: Diagnosis not present

## 2022-01-20 DIAGNOSIS — G9341 Metabolic encephalopathy: Secondary | ICD-10-CM | POA: Diagnosis not present

## 2022-01-20 DIAGNOSIS — Z6821 Body mass index (BMI) 21.0-21.9, adult: Secondary | ICD-10-CM | POA: Diagnosis not present

## 2022-06-26 DIAGNOSIS — E7849 Other hyperlipidemia: Secondary | ICD-10-CM | POA: Diagnosis not present

## 2022-06-26 DIAGNOSIS — I10 Essential (primary) hypertension: Secondary | ICD-10-CM | POA: Diagnosis not present

## 2022-06-26 DIAGNOSIS — E039 Hypothyroidism, unspecified: Secondary | ICD-10-CM | POA: Diagnosis not present

## 2022-06-26 DIAGNOSIS — M810 Age-related osteoporosis without current pathological fracture: Secondary | ICD-10-CM | POA: Diagnosis not present

## 2022-06-26 DIAGNOSIS — Z6821 Body mass index (BMI) 21.0-21.9, adult: Secondary | ICD-10-CM | POA: Diagnosis not present

## 2022-06-26 DIAGNOSIS — N1832 Chronic kidney disease, stage 3b: Secondary | ICD-10-CM | POA: Diagnosis not present

## 2022-09-10 NOTE — Progress Notes (Signed)
No chief complaint on file.

## 2023-01-07 DIAGNOSIS — N1832 Chronic kidney disease, stage 3b: Secondary | ICD-10-CM | POA: Diagnosis not present

## 2023-01-07 DIAGNOSIS — E7849 Other hyperlipidemia: Secondary | ICD-10-CM | POA: Diagnosis not present

## 2023-01-07 DIAGNOSIS — E782 Mixed hyperlipidemia: Secondary | ICD-10-CM | POA: Diagnosis not present

## 2023-01-07 DIAGNOSIS — E039 Hypothyroidism, unspecified: Secondary | ICD-10-CM | POA: Diagnosis not present

## 2023-01-07 DIAGNOSIS — Z6822 Body mass index (BMI) 22.0-22.9, adult: Secondary | ICD-10-CM | POA: Diagnosis not present

## 2023-01-07 DIAGNOSIS — Z0001 Encounter for general adult medical examination with abnormal findings: Secondary | ICD-10-CM | POA: Diagnosis not present
